# Patient Record
Sex: Male | Born: 1945 | Race: White | Hispanic: No | Marital: Married | Smoking: Never smoker
Health system: Southern US, Community
[De-identification: ages and names within clinical notes are randomized; demographics above are authoritative.]

## PROBLEM LIST (undated history)

## (undated) DIAGNOSIS — I219 Acute myocardial infarction, unspecified: Secondary | ICD-10-CM

## (undated) DIAGNOSIS — Z77098 Contact with and (suspected) exposure to other hazardous, chiefly nonmedicinal, chemicals: Secondary | ICD-10-CM

## (undated) DIAGNOSIS — K859 Acute pancreatitis without necrosis or infection, unspecified: Secondary | ICD-10-CM

## (undated) DIAGNOSIS — M1990 Unspecified osteoarthritis, unspecified site: Secondary | ICD-10-CM

## (undated) DIAGNOSIS — I10 Essential (primary) hypertension: Secondary | ICD-10-CM

## (undated) DIAGNOSIS — F419 Anxiety disorder, unspecified: Secondary | ICD-10-CM

## (undated) DIAGNOSIS — N200 Calculus of kidney: Secondary | ICD-10-CM

## (undated) DIAGNOSIS — I2510 Atherosclerotic heart disease of native coronary artery without angina pectoris: Secondary | ICD-10-CM

## (undated) DIAGNOSIS — G4730 Sleep apnea, unspecified: Secondary | ICD-10-CM

## (undated) DIAGNOSIS — E119 Type 2 diabetes mellitus without complications: Secondary | ICD-10-CM

## (undated) HISTORY — PX: CORONARY ARTERY BYPASS GRAFT: SHX141

## (undated) HISTORY — PX: TONSILLECTOMY: SUR1361

## (undated) HISTORY — PX: VASECTOMY: SHX75

## (undated) HISTORY — PX: CORONARY ANGIOPLASTY: SHX604

## (undated) HISTORY — PX: JOINT REPLACEMENT: SHX530

## (undated) HISTORY — PX: COLONOSCOPY: SHX174

## (undated) HISTORY — PX: EYE SURGERY: SHX253

## (undated) HISTORY — PX: CARDIAC CATHETERIZATION: SHX172

## (undated) HISTORY — PX: OTHER SURGICAL HISTORY: SHX169

---

## 2003-07-07 ENCOUNTER — Other Ambulatory Visit: Payer: Self-pay

## 2004-06-20 ENCOUNTER — Ambulatory Visit: Payer: Self-pay | Admitting: Internal Medicine

## 2006-12-05 ENCOUNTER — Other Ambulatory Visit: Payer: Self-pay

## 2006-12-05 ENCOUNTER — Emergency Department: Payer: Self-pay | Admitting: Emergency Medicine

## 2007-12-20 ENCOUNTER — Emergency Department: Payer: Self-pay | Admitting: Emergency Medicine

## 2009-11-03 ENCOUNTER — Emergency Department: Payer: Self-pay | Admitting: Emergency Medicine

## 2011-02-15 HISTORY — PX: REPLACEMENT TOTAL KNEE: SUR1224

## 2012-02-07 ENCOUNTER — Ambulatory Visit: Payer: Self-pay | Admitting: Specialist

## 2012-02-07 LAB — CBC
HCT: 41.1 % (ref 40.0–52.0)
HGB: 14.3 g/dL (ref 13.0–18.0)
MCH: 30.1 pg (ref 26.0–34.0)
MCHC: 34.8 g/dL (ref 32.0–36.0)
MCV: 86 fL (ref 80–100)
RDW: 13.8 % (ref 11.5–14.5)

## 2012-02-07 LAB — PROTIME-INR: INR: 1

## 2012-02-07 LAB — MRSA PCR SCREENING

## 2012-02-16 ENCOUNTER — Inpatient Hospital Stay: Payer: Self-pay | Admitting: Specialist

## 2012-02-16 LAB — CREATININE, SERUM
Creatinine: 1.17 mg/dL (ref 0.60–1.30)
EGFR (African American): >60

## 2012-02-17 LAB — BASIC METABOLIC PANEL
BUN: 19 mg/dL — ABNORMAL HIGH (ref 7–18)
Calcium, Total: 8.5 mg/dL (ref 8.5–10.1)
Chloride: 101 mmol/L (ref 98–107)
Creatinine: 1.09 mg/dL (ref 0.60–1.30)
Glucose: 298 mg/dL — ABNORMAL HIGH (ref 65–99)
Osmolality: 287 (ref 275–301)
Potassium: 3.7 mmol/L (ref 3.5–5.1)
Sodium: 137 mmol/L (ref 136–145)

## 2012-02-17 LAB — HEMATOCRIT: HCT: 35.8 % — ABNORMAL LOW (ref 40.0–52.0)

## 2012-02-18 LAB — CBC WITH DIFFERENTIAL/PLATELET
Basophil #: 0 10*3/uL (ref 0.0–0.1)
Basophil %: 0.6 %
Eosinophil #: 0.1 10*3/uL (ref 0.0–0.7)
Eosinophil %: 1.7 %
HCT: 33.8 % — ABNORMAL LOW (ref 40.0–52.0)
HGB: 11.4 g/dL — ABNORMAL LOW (ref 13.0–18.0)
Lymphocyte #: 1.1 10*3/uL (ref 1.0–3.6)
MCH: 29.1 pg (ref 26.0–34.0)
MCHC: 33.6 g/dL (ref 32.0–36.0)
MCV: 87 fL (ref 80–100)
Monocyte #: 0.5 x10 3/mm (ref 0.2–1.0)
Monocyte %: 7.1 %
Neutrophil %: 76.9 %
Platelet: 146 10*3/uL — ABNORMAL LOW (ref 150–440)
RBC: 3.9 10*6/uL — ABNORMAL LOW (ref 4.40–5.90)
RDW: 13.6 % (ref 11.5–14.5)

## 2012-05-16 ENCOUNTER — Ambulatory Visit: Payer: Self-pay | Admitting: Ophthalmology

## 2012-09-11 ENCOUNTER — Other Ambulatory Visit (HOSPITAL_COMMUNITY): Payer: Self-pay | Admitting: Orthopedic Surgery

## 2012-10-03 ENCOUNTER — Encounter (HOSPITAL_COMMUNITY)
Admission: RE | Admit: 2012-10-03 | Discharge: 2012-10-03 | Disposition: A | Discharge status: Home / Self Care | Payer: Medicare Other | Source: Ambulatory Visit | Attending: Orthopedic Surgery | Admitting: Orthopedic Surgery

## 2012-10-03 ENCOUNTER — Encounter (HOSPITAL_COMMUNITY): Payer: Self-pay

## 2012-10-03 DIAGNOSIS — Z01812 Encounter for preprocedural laboratory examination: Secondary | ICD-10-CM | POA: Insufficient documentation

## 2012-10-03 DIAGNOSIS — Z01818 Encounter for other preprocedural examination: Secondary | ICD-10-CM | POA: Insufficient documentation

## 2012-10-03 HISTORY — DX: Anxiety disorder, unspecified: F41.9

## 2012-10-03 HISTORY — DX: Essential (primary) hypertension: I10

## 2012-10-03 HISTORY — DX: Acute myocardial infarction, unspecified: I21.9

## 2012-10-03 HISTORY — DX: Type 2 diabetes mellitus without complications: E11.9

## 2012-10-03 HISTORY — DX: Unspecified osteoarthritis, unspecified site: M19.90

## 2012-10-03 LAB — COMPREHENSIVE METABOLIC PANEL
AST: 24 U/L (ref 0–37)
CO2: 26 mEq/L (ref 19–32)
Calcium: 10.1 mg/dL (ref 8.4–10.5)
Chloride: 104 mEq/L (ref 96–112)
Creatinine, Ser: 1 mg/dL (ref 0.50–1.35)
GFR calc Af Amer: 88 mL/min — ABNORMAL LOW (ref >90)
GFR calc non Af Amer: 76 mL/min — ABNORMAL LOW (ref >90)
Glucose, Bld: 79 mg/dL (ref 70–99)
Total Bilirubin: 0.3 mg/dL (ref 0.3–1.2)

## 2012-10-03 LAB — CBC
HCT: 40.7 % (ref 39.0–52.0)
Hemoglobin: 13.6 g/dL (ref 13.0–17.0)
MCH: 28.6 pg (ref 26.0–34.0)
MCV: 85.5 fL (ref 78.0–100.0)
Platelets: 182 10*3/uL (ref 150–400)
RBC: 4.76 MIL/uL (ref 4.22–5.81)
WBC: 6.1 10*3/uL (ref 4.0–10.5)

## 2012-10-03 LAB — PROTIME-INR: INR: 0.94 (ref 0.00–1.49)

## 2012-10-03 NOTE — Progress Notes (Signed)
Dr Adonis Brook called for ekg,stress.ov

## 2012-10-03 NOTE — Pre-Procedure Instructions (Signed)
Mark Bean  10/03/2012   Your procedure is scheduled on:  10/06/12  Report to [facility] at 1015 AM.  Call this number if you have problems the morning of surgery: [phone]   Remember:   Do not eat food or drink liquids after midnight.   Take these medicines the morning of surgery with A SIP OF WATER: none   Do not wear jewelry, make-up or nail polish.  Do not wear lotions, powders, or perfumes. You may wear deodorant.  Do not shave 48 hours prior to surgery. Men may shave face and neck.  Do not bring valuables to the hospital.  [facility] is not responsible                   for any belongings or valuables.  Contacts, dentures or bridgework may not be worn into surgery.  Leave suitcase in the car. After surgery it may be brought to your room.  For patients admitted to the hospital, checkout time is 11:00 AM the day of  discharge.   Patients discharged the day of surgery will not be allowed to drive  home.  Name and phone number of your driver: family  Special Instructions: Incentive Spirometry - Practice and bring it with you on the day of surgery.   Please read over the following fact sheets that you were given: Pain Booklet, Coughing and Deep Breathing and Surgical Site Infection Prevention

## 2012-10-10 ENCOUNTER — Other Ambulatory Visit (HOSPITAL_COMMUNITY): Payer: Self-pay | Admitting: Orthopedic Surgery

## 2012-10-10 MED ORDER — CEFAZOLIN SODIUM-DEXTROSE 2-3 GM-% IV SOLR
2.0000 g | INTRAVENOUS | Status: AC
  Administered 2012-10-11: 2 g via INTRAVENOUS
  Filled 2012-10-10: qty 50

## 2012-10-10 NOTE — Progress Notes (Signed)
Notified wife of time change to arrive at 06:30 verbalized understanding

## 2012-10-11 ENCOUNTER — Ambulatory Visit (HOSPITAL_COMMUNITY): Payer: Medicare Other | Admitting: Anesthesiology

## 2012-10-11 ENCOUNTER — Encounter (HOSPITAL_COMMUNITY): Payer: Self-pay | Admitting: *Deleted

## 2012-10-11 ENCOUNTER — Observation Stay (HOSPITAL_COMMUNITY)
Admission: RE | Admit: 2012-10-11 | Discharge: 2012-10-13 | Disposition: A | Discharge status: Home / Self Care | Payer: Medicare Other | Source: Ambulatory Visit | Attending: Orthopedic Surgery | Admitting: Orthopedic Surgery

## 2012-10-11 ENCOUNTER — Encounter (HOSPITAL_COMMUNITY): Payer: Self-pay | Admitting: Anesthesiology

## 2012-10-11 ENCOUNTER — Encounter (HOSPITAL_COMMUNITY)
Admission: RE | Disposition: A | Discharge status: Home / Self Care | Payer: Self-pay | Source: Ambulatory Visit | Attending: Orthopedic Surgery

## 2012-10-11 DIAGNOSIS — E11610 Type 2 diabetes mellitus with diabetic neuropathic arthropathy: Secondary | ICD-10-CM

## 2012-10-11 DIAGNOSIS — M869 Osteomyelitis, unspecified: Principal | ICD-10-CM | POA: Insufficient documentation

## 2012-10-11 DIAGNOSIS — A5211 Tabes dorsalis: Secondary | ICD-10-CM | POA: Insufficient documentation

## 2012-10-11 HISTORY — PX: AMPUTATION: SHX166

## 2012-10-11 HISTORY — PX: ANKLE FUSION: SHX881

## 2012-10-11 LAB — GLUCOSE, CAPILLARY
Glucose-Capillary: 190 mg/dL — ABNORMAL HIGH (ref 70–99)
Glucose-Capillary: 156 mg/dL — ABNORMAL HIGH (ref 70–99)
Glucose-Capillary: 141 mg/dL — ABNORMAL HIGH (ref 70–99)

## 2012-10-11 SURGERY — AMPUTATION, FOOT, RAY
Anesthesia: General | Site: Foot | Laterality: Right | Wound class: Clean

## 2012-10-11 MED ORDER — CEFAZOLIN SODIUM 1-5 GM-% IV SOLN
1.0000 g | Freq: Four times a day (QID) | INTRAVENOUS | Status: AC
  Administered 2012-10-11 – 2012-10-12 (×3): 1 g via INTRAVENOUS
  Filled 2012-10-11 (×3): qty 50

## 2012-10-11 MED ORDER — ASPIRIN 325 MG PO TABS: 325.0000 mg | ORAL_TABLET | Freq: Every day | ORAL | Status: DC

## 2012-10-11 MED ORDER — PROMETHAZINE HCL 25 MG/ML IJ SOLN: 6.2500 mg | INTRAMUSCULAR | Status: DC | PRN

## 2012-10-11 MED ORDER — GLYCOPYRROLATE 0.2 MG/ML IJ SOLN
INTRAMUSCULAR | Status: DC | PRN
  Administered 2012-10-11: 0.2 mg via INTRAVENOUS

## 2012-10-11 MED ORDER — CARVEDILOL 12.5 MG PO TABS
12.5000 mg | ORAL_TABLET | Freq: Two times a day (BID) | ORAL | Status: DC
  Administered 2012-10-11 – 2012-10-13 (×4): 12.5 mg via ORAL
  Filled 2012-10-11 (×6): qty 1

## 2012-10-11 MED ORDER — METOCLOPRAMIDE HCL 10 MG PO TABS: 5.0000 mg | ORAL_TABLET | Freq: Three times a day (TID) | ORAL | Status: DC | PRN

## 2012-10-11 MED ORDER — INSULIN GLARGINE 100 UNIT/ML ~~LOC~~ SOLN
60.0000 [IU] | Freq: Every day | SUBCUTANEOUS | Status: DC
  Filled 2012-10-11 (×3): qty 0.6

## 2012-10-11 MED ORDER — CITALOPRAM HYDROBROMIDE 20 MG PO TABS
20.0000 mg | ORAL_TABLET | Freq: Every day | ORAL | Status: DC
  Administered 2012-10-12 – 2012-10-13 (×2): 20 mg via ORAL
  Filled 2012-10-11 (×2): qty 1

## 2012-10-11 MED ORDER — OXYCODONE-ACETAMINOPHEN 5-325 MG PO TABS
1.0000 | ORAL_TABLET | ORAL | Status: DC | PRN
  Administered 2012-10-12 (×2): 2 via ORAL
  Administered 2012-10-12: 1 via ORAL
  Administered 2012-10-13 (×2): 2 via ORAL
  Filled 2012-10-11 (×3): qty 2
  Filled 2012-10-11: qty 1
  Filled 2012-10-11: qty 2

## 2012-10-11 MED ORDER — HYDROCODONE-ACETAMINOPHEN 5-325 MG PO TABS
1.0000 | ORAL_TABLET | ORAL | Status: DC | PRN
  Administered 2012-10-11 – 2012-10-12 (×5): 1 via ORAL
  Filled 2012-10-11 (×5): qty 1

## 2012-10-11 MED ORDER — FENTANYL CITRATE 0.05 MG/ML IJ SOLN: 25.0000 ug | INTRAMUSCULAR | Status: DC | PRN

## 2012-10-11 MED ORDER — SODIUM CHLORIDE 0.9 % IV SOLN
INTRAVENOUS | Status: DC
  Administered 2012-10-11: 14:00:00 via INTRAVENOUS

## 2012-10-11 MED ORDER — MEPERIDINE HCL 25 MG/ML IJ SOLN: 6.2500 mg | INTRAMUSCULAR | Status: DC | PRN

## 2012-10-11 MED ORDER — CHLORTHALIDONE 25 MG PO TABS
25.0000 mg | ORAL_TABLET | Freq: Every day | ORAL | Status: DC
  Administered 2012-10-11 – 2012-10-13 (×3): 25 mg via ORAL
  Filled 2012-10-11 (×3): qty 1

## 2012-10-11 MED ORDER — FENTANYL CITRATE 0.05 MG/ML IJ SOLN
INTRAMUSCULAR | Status: DC | PRN
  Administered 2012-10-11 (×2): 50 ug via INTRAVENOUS

## 2012-10-11 MED ORDER — LISINOPRIL 40 MG PO TABS
40.0000 mg | ORAL_TABLET | Freq: Every day | ORAL | Status: DC
  Administered 2012-10-11 – 2012-10-13 (×3): 40 mg via ORAL
  Filled 2012-10-11 (×3): qty 1

## 2012-10-11 MED ORDER — INSULIN ASPART 100 UNIT/ML ~~LOC~~ SOLN
0.0000 [IU] | Freq: Three times a day (TID) | SUBCUTANEOUS | Status: DC
  Administered 2012-10-12: 2 [IU] via SUBCUTANEOUS
  Administered 2012-10-12: 3 [IU] via SUBCUTANEOUS
  Administered 2012-10-12: 5 [IU] via SUBCUTANEOUS
  Administered 2012-10-13 (×2): 3 [IU] via SUBCUTANEOUS

## 2012-10-11 MED ORDER — METOCLOPRAMIDE HCL 5 MG/ML IJ SOLN: 5.0000 mg | Freq: Three times a day (TID) | INTRAMUSCULAR | Status: DC | PRN

## 2012-10-11 MED ORDER — ASPIRIN EC 325 MG PO TBEC
325.0000 mg | DELAYED_RELEASE_TABLET | Freq: Every day | ORAL | Status: DC
  Administered 2012-10-11 – 2012-10-13 (×3): 325 mg via ORAL
  Filled 2012-10-11 (×3): qty 1

## 2012-10-11 MED ORDER — ONDANSETRON HCL 4 MG/2ML IJ SOLN
INTRAMUSCULAR | Status: DC | PRN
  Administered 2012-10-11: 4 mg via INTRAVENOUS

## 2012-10-11 MED ORDER — 0.9 % SODIUM CHLORIDE (POUR BTL) OPTIME
TOPICAL | Status: DC | PRN
  Administered 2012-10-11: 1000 mL

## 2012-10-11 MED ORDER — PROPOFOL 10 MG/ML IV BOLUS
INTRAVENOUS | Status: DC | PRN
  Administered 2012-10-11: 200 mg via INTRAVENOUS

## 2012-10-11 MED ORDER — ONDANSETRON HCL 4 MG PO TABS: 4.0000 mg | ORAL_TABLET | Freq: Four times a day (QID) | ORAL | Status: DC | PRN

## 2012-10-11 MED ORDER — LIDOCAINE HCL (CARDIAC) 20 MG/ML IV SOLN
INTRAVENOUS | Status: DC | PRN
  Administered 2012-10-11: 40 mg via INTRAVENOUS

## 2012-10-11 MED ORDER — SIMVASTATIN 20 MG PO TABS
20.0000 mg | ORAL_TABLET | Freq: Every day | ORAL | Status: DC
  Administered 2012-10-11 – 2012-10-12 (×2): 20 mg via ORAL
  Filled 2012-10-11 (×3): qty 1

## 2012-10-11 MED ORDER — ONDANSETRON HCL 4 MG/2ML IJ SOLN: 4.0000 mg | Freq: Four times a day (QID) | INTRAMUSCULAR | Status: DC | PRN

## 2012-10-11 MED ORDER — HYDROMORPHONE HCL PF 1 MG/ML IJ SOLN
0.5000 mg | INTRAMUSCULAR | Status: DC | PRN
  Administered 2012-10-11 – 2012-10-12 (×2): 1 mg via INTRAVENOUS
  Filled 2012-10-11 (×2): qty 1

## 2012-10-11 MED ORDER — LACTATED RINGERS IV SOLN
INTRAVENOUS | Status: DC | PRN
  Administered 2012-10-11 (×2): via INTRAVENOUS

## 2012-10-11 MED ORDER — METFORMIN HCL 500 MG PO TABS
1000.0000 mg | ORAL_TABLET | Freq: Two times a day (BID) | ORAL | Status: DC
  Administered 2012-10-11 – 2012-10-13 (×4): 1000 mg via ORAL
  Filled 2012-10-11 (×6): qty 2

## 2012-10-11 MED ORDER — INSULIN ASPART 100 UNIT/ML ~~LOC~~ SOLN
4.0000 [IU] | Freq: Three times a day (TID) | SUBCUTANEOUS | Status: DC
  Administered 2012-10-11 – 2012-10-13 (×6): 4 [IU] via SUBCUTANEOUS

## 2012-10-11 MED ORDER — MIDAZOLAM HCL 2 MG/2ML IJ SOLN: 0.5000 mg | Freq: Once | INTRAMUSCULAR | Status: DC | PRN

## 2012-10-11 MED ORDER — PHENYLEPHRINE HCL 10 MG/ML IJ SOLN
INTRAMUSCULAR | Status: DC | PRN
  Administered 2012-10-11: 120 ug via INTRAVENOUS
  Administered 2012-10-11 (×2): 80 ug via INTRAVENOUS
  Administered 2012-10-11: 120 ug via INTRAVENOUS
  Administered 2012-10-11: 80 ug via INTRAVENOUS

## 2012-10-11 SURGICAL SUPPLY — 52 items
BANDAGE ESMARK 6X9 LF (GAUZE/BANDAGES/DRESSINGS) IMPLANT
BANDAGE GAUZE ELAST BULKY 4 IN (GAUZE/BANDAGES/DRESSINGS) ×4 IMPLANT
BIT DRILL 5 ACE CANN QC (BIT) ×1 IMPLANT
BLADE SAW SGTL 83.5X18.5 (BLADE) ×2 IMPLANT
BLADE SAW SGTL MED 73X18.5 STR (BLADE) ×1 IMPLANT
BLADE SURG 10 STRL SS (BLADE) IMPLANT
BNDG CMPR 9X6 STRL LF SNTH (GAUZE/BANDAGES/DRESSINGS) ×1
BNDG COHESIVE 4X5 TAN STRL (GAUZE/BANDAGES/DRESSINGS) ×4 IMPLANT
BNDG COHESIVE 6X5 TAN STRL LF (GAUZE/BANDAGES/DRESSINGS) ×2 IMPLANT
BNDG ESMARK 6X9 LF (GAUZE/BANDAGES/DRESSINGS) ×2
CLOTH BEACON ORANGE TIMEOUT ST (SAFETY) ×2 IMPLANT
COTTON STERILE ROLL (GAUZE/BANDAGES/DRESSINGS) ×2 IMPLANT
COVER MAYO STAND STRL (DRAPES) IMPLANT
COVER SURGICAL LIGHT HANDLE (MISCELLANEOUS) ×2 IMPLANT
CUFF TOURNIQUET SINGLE 34IN LL (TOURNIQUET CUFF) IMPLANT
CUFF TOURNIQUET SINGLE 44IN (TOURNIQUET CUFF) IMPLANT
DRAPE INCISE IOBAN 66X45 STRL (DRAPES) ×2 IMPLANT
DRAPE OEC MINIVIEW 54X84 (DRAPES) ×2 IMPLANT
DRAPE PROXIMA HALF (DRAPES) ×2 IMPLANT
DRAPE U-SHAPE 47X51 STRL (DRAPES) ×3 IMPLANT
DRSG ADAPTIC 3X8 NADH LF (GAUZE/BANDAGES/DRESSINGS) ×2 IMPLANT
DURAPREP 26ML APPLICATOR (WOUND CARE) ×2 IMPLANT
ELECT REM PT RETURN 9FT ADLT (ELECTROSURGICAL) ×2
ELECTRODE REM PT RTRN 9FT ADLT (ELECTROSURGICAL) ×1 IMPLANT
GLOVE BIOGEL PI IND STRL 9 (GLOVE) ×1 IMPLANT
GLOVE BIOGEL PI INDICATOR 9 (GLOVE) ×1
GLOVE SURG ORTHO 9.0 STRL STRW (GLOVE) ×2 IMPLANT
GOWN PREVENTION PLUS XLARGE (GOWN DISPOSABLE) ×2 IMPLANT
GOWN SRG XL XLNG 56XLVL 4 (GOWN DISPOSABLE) ×2 IMPLANT
GOWN STRL NON-REIN XL XLG LVL4 (GOWN DISPOSABLE) ×4
KIT BASIN OR (CUSTOM PROCEDURE TRAY) ×2 IMPLANT
KIT ROOM TURNOVER OR (KITS) ×2 IMPLANT
MANIFOLD NEPTUNE II (INSTRUMENTS) ×2 IMPLANT
NS IRRIG 1000ML POUR BTL (IV SOLUTION) ×2 IMPLANT
PACK ORTHO EXTREMITY (CUSTOM PROCEDURE TRAY) ×2 IMPLANT
PAD ARMBOARD 7.5X6 YLW CONV (MISCELLANEOUS) ×4 IMPLANT
PAD CAST 4YDX4 CTTN HI CHSV (CAST SUPPLIES) ×1 IMPLANT
PADDING CAST COTTON 4X4 STRL (CAST SUPPLIES) ×2
PIN THREADED GUIDE ACE (PIN) ×2 IMPLANT
SCREW LAG CAN 8.0 24 THR 120 (Screw) ×1 IMPLANT
SPONGE GAUZE 4X4 12PLY (GAUZE/BANDAGES/DRESSINGS) ×2 IMPLANT
SPONGE LAP 18X18 X RAY DECT (DISPOSABLE) ×3 IMPLANT
STAPLER VISISTAT 35W (STAPLE) ×2 IMPLANT
STOCKINETTE IMPERVIOUS LG (DRAPES) IMPLANT
SUCTION FRAZIER TIP 10 FR DISP (SUCTIONS) ×2 IMPLANT
SUT ETHILON 2 0 PSLX (SUTURE) ×6 IMPLANT
SUT VIC AB 2-0 CTB1 (SUTURE) ×3 IMPLANT
TOWEL OR 17X24 6PK STRL BLUE (TOWEL DISPOSABLE) ×2 IMPLANT
TOWEL OR 17X26 10 PK STRL BLUE (TOWEL DISPOSABLE) ×2 IMPLANT
TUBE CONNECTING 12X1/4 (SUCTIONS) ×2 IMPLANT
UNDERPAD 30X30 INCONTINENT (UNDERPADS AND DIAPERS) ×2 IMPLANT
WATER STERILE IRR 1000ML POUR (IV SOLUTION) ×2 IMPLANT

## 2012-10-11 NOTE — Transfer of Care (Signed)
Immediate Anesthesia Transfer of Care Note  Patient: Mark Bean  Procedure(s) Performed: Procedure(s): 2nd ray amputation, medial column fusion and ostectomy right foot (Right) ARTHRODESIS ANKLE (Right)  Patient Location: PACU  Anesthesia Type:General  Level of Consciousness: patient cooperative and responds to stimulation  Airway & Oxygen Therapy: Patient Spontanous Breathing and Patient connected to nasal cannula oxygen  Post-op Assessment: Report given to PACU RN and Post -op Vital signs reviewed and stable  Post vital signs: Reviewed and stable  Complications: No apparent anesthesia complications

## 2012-10-11 NOTE — Progress Notes (Signed)
At 1155, patient arrived from PACU. Dr. Lajoyce Corners shortly thereafter to discuss follow-up with patient and family. Small amount of bleeding through surgical dressing on right heel. Reinforced with an ABD on a pad, elevated extremity. Will monitor. VSS. Patient on oxygen 2 L. 1 Vicodin given for 5/10 pain in right foot with good relief. Will monitor mobility and comfort. Sherlyn Lees, RN

## 2012-10-11 NOTE — Progress Notes (Signed)
Orthopedic Tech Progress Note Patient Details:  Mark Bean 05-07-1945 [id number]  Ortho Devices Type of Ortho Device: Postop shoe/boot Ortho Device/Splint Interventions: Application   Cammer, Mickie Bail 10/11/2012, 1:40 PM

## 2012-10-11 NOTE — Progress Notes (Signed)
At 1900, patient standing up to void 800 cc. Dressing reinforced on foot with increased bleeding. Sherlyn Lees, RN

## 2012-10-11 NOTE — H&P (Signed)
Mark Bean is an 67 y.o. male.   Chief Complaint: Osteomyelitis right foot second toe with Charcot collapse of the medial column HPI: Patient is a 67 year old gentleman with diabetic insensate neuropathy. 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  Past Medical History  Diagnosis Date  . Myocardial infarction   . Diabetes mellitus without complication   . Arthritis   . Anxiety   . Hypertension     dr Shonna Chock    [id number]    Past Surgical History  Procedure Laterality Date  . Cardiac catheterization      2000  . Joint replacement    . Coronary artery bypass graft      2000   1 stent  . Eye surgery      No family history on file. Social History:  reports that he has never smoked. He does not have any smokeless tobacco history on file. He reports that  drinks alcohol. He reports that he does not use illicit drugs.  Allergies: No Known Allergies  Medications Prior to Admission  Medication Sig Dispense Refill  . aspirin 325 MG tablet Take 325 mg by mouth at bedtime.      . carvedilol (COREG) 12.5 MG tablet Take 12.5 mg by mouth 2 (two) times daily with a meal.      . chlorthalidone (HYGROTON) 25 MG tablet Take 25 mg by mouth daily.      . ciprofloxacin (CIPRO) 500 MG tablet Take 500 mg by mouth 2 (two) times daily.      . citalopram (CELEXA) 20 MG tablet Take 20 mg by mouth daily.      [name] doxycycline (VIBRA-TABS) 100 MG tablet Take 100 mg by mouth 2 (two) times daily.      . insulin aspart (NOVOLOG) 100 UNIT/ML injection Inject 30 Units into the skin 3 (three) times daily with meals.      . insulin glargine (LANTUS) 100 UNIT/ML injection Inject 60 Units into the skin at bedtime.      [name] lisinopril (PRINIVIL,ZESTRIL) 40 MG tablet Take 40 mg by mouth daily.      . metFORMIN (GLUCOPHAGE) 1000 MG tablet Take 1,000 mg by mouth 2 (two) times daily with a meal.      . Omega-3 Fatty Acids (FISH  OIL TRIPLE STRENGTH) 1400 MG CAPS Take 1,400 mg by mouth daily.      . simvastatin (ZOCOR) 40 MG tablet Take 20 mg by mouth at bedtime.      . Vitamin D, Ergocalciferol, (DRISDOL) 50000 UNITS CAPS capsule Take 50,000 Units by mouth every 7 (seven) days. Takes on Tuesdays.        No results found for this or any previous visit (from the past 48 hour(s)). No results found.  Review of Systems  All other systems reviewed and are negative.    There were no vitals taken for this visit. Physical Exam  On examination patient has palpable pulses. There is ulceration osteomyelitis of the second toe. There is a rocker-bottom deformity through the medial column. 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  Plan: We'll plan for second ray amputation of the right second toe. Will plan for correction of the rocker-bottom deformity with internal fixation of the medial column right foot. Risks and benefits were discussed including persistent infection nonhealing of the wound need for additional surgery.  Patient states he understands and wished to proceed at this time.  DUDA,MARCUS V 10/11/2012, 6:14 AM

## 2012-10-11 NOTE — Anesthesia Postprocedure Evaluation (Signed)
Anesthesia Post Note  Patient: Mark Bean  Procedure(s) Performed: Procedure(s) (LRB): 2nd ray amputation, medial column fusion and ostectomy right foot (Right) ARTHRODESIS ANKLE (Right)  Anesthesia type: GA  Patient location: PACU  Post pain: Pain level controlled  Post assessment: Post-op Vital signs reviewed  Last Vitals:  Filed Vitals:   10/11/12 1139  BP:   Pulse:   Temp: 36.5 C  Resp:     Post vital signs: Reviewed  Level of consciousness: sedated  Complications: No apparent anesthesia complications

## 2012-10-11 NOTE — Anesthesia Preprocedure Evaluation (Signed)
Anesthesia Evaluation  Patient identified by MRN, date of birth, ID band Patient awake    Reviewed: Allergy & Precautions, H&P , Patient's Chart, lab work & pertinent test results, reviewed documented beta blocker date and time   History of Anesthesia Complications Negative for: history of anesthetic complications  Airway Mallampati: III TM Distance: >3 FB Neck ROM: full    Dental no notable dental hx.    Pulmonary neg pulmonary ROS,  breath sounds clear to auscultation  Pulmonary exam normal       Cardiovascular Exercise Tolerance: Good hypertension, + Past MI negative cardio ROS  Rhythm:regular Rate:Normal     Neuro/Psych PSYCHIATRIC DISORDERS Anxiety negative neurological ROS  negative psych ROS   GI/Hepatic negative GI ROS, Neg liver ROS,   Endo/Other  negative endocrine ROSdiabetesMorbid obesity  Renal/GU negative Renal ROS     Musculoskeletal   Abdominal   Peds  Hematology negative hematology ROS (+)   Anesthesia Other Findings   Reproductive/Obstetrics negative OB ROS                           Anesthesia Physical Anesthesia Plan  ASA: III  Anesthesia Plan: General LMA   Post-op Pain Management:    Induction:   Airway Management Planned:   Additional Equipment:   Intra-op Plan:   Post-operative Plan:   Informed Consent: I have reviewed the patients History and Physical, chart, labs and discussed the procedure including the risks, benefits and alternatives for the proposed anesthesia with the patient or authorized representative who has indicated his/her understanding and acceptance.   Dental Advisory Given  Plan Discussed with: CRNA, Surgeon and Anesthesiologist  Anesthesia Plan Comments:         Anesthesia Quick Evaluation

## 2012-10-11 NOTE — Op Note (Signed)
OPERATIVE REPORT  DATE OF SURGERY: 10/11/2012  PATIENT:  Mark Bean,  67 y.o. male  PRE-OPERATIVE DIAGNOSIS:  Charcot Collapse and osteomyelitis 2nd toe right foot  POST-OPERATIVE DIAGNOSIS:  charcot collapse and osteomyelitits 2nd toe right foot  PROCEDURE:  Procedure(s): 2nd ray amputation, medial column fusion and ostectomy right foot ARTHRODESIS ANKLE  SURGEON:  Surgeon(s): [name], MD  ANESTHESIA:   general  EBL:  min ML  SPECIMEN:  No Specimen  TOURNIQUET:  * No tourniquets in log *  PROCEDURE DETAILS: Patient is a 67 year old gentleman with diabetic insensate neuropathy. He has osteomyelitis of the second toe right foot as well as Charcot collapse. Patient is failed conservative treatment and presents at this time for amputation of the second toe as well as medial column realignment with intramedullary fusion of the medial column. Risks and benefits were discussed including infection nonhealing of the wounds nonhealing of the bone need for additional surgery. Patient states he understands and wishes to proceed at this time. Description of procedure patient was brought to the operating room and underwent a general anesthetic. After adequate levels and anesthesia were obtained patient's right lower extremity was prepped using DuraPrep and draped into a sterile field. Attention was first focused on the second toe. Patient's toe was amputated through the MTP joint. Wound was irrigated hemostasis was obtained there is no deep abscess no signs of any infection at the surgical site. The incision was closed using 2-0 nylon. Attention was then focused on the medial column. Patient had a Charcot rocker-bottom deformity. Patient underwent a medial incision the base of the first metatarsal medial cuneiform joint was debrided with a saw and the medial cuneiform navicular joint was also debrided. The medial column was realigned an intramedullary guidewire was placed from the metatarsal  head up into the talus. C-arm fluoroscopy verified reduction. This was drilled and then stabilized with a 120 mm x 8 mm cannulated screw. The wound was irrigated with normal saline C-arm fluoroscopy verified reduction. The incision was closed using 2-0 nylon. The wound was covered with Adaptic orthopedic sponges AB dressing Kerlix and Coban. Patient was extubated taken to the PACU in stable condition.  PLAN OF CARE: Admit to inpatient   PATIENT DISPOSITION:  PACU - hemodynamically stable.   [name], MD 10/11/2012 9:55 AM

## 2012-10-11 NOTE — Anesthesia Procedure Notes (Signed)
Procedure Name: LMA Insertion Date/Time: 10/11/2012 8:56 AM Performed by: Arlice Colt B Pre-anesthesia Checklist: Patient identified, Emergency Drugs available, Suction available, Patient being monitored and Timeout performed Patient Re-evaluated:Patient Re-evaluated prior to inductionOxygen Delivery Method: Circle system utilized Preoxygenation: Pre-oxygenation with 100% oxygen Intubation Type: IV induction LMA: LMA with gastric port inserted LMA Size: 5.0 Number of attempts: 1 Placement Confirmation: positive ETCO2 and breath sounds checked- equal and bilateral Tube secured with: Tape Dental Injury: Teeth and Oropharynx as per pre-operative assessment

## 2012-10-12 ENCOUNTER — Encounter (HOSPITAL_COMMUNITY)
Admission: RE | Disposition: A | Discharge status: Home / Self Care | Payer: Self-pay | Source: Ambulatory Visit | Attending: Orthopedic Surgery

## 2012-10-12 LAB — GLUCOSE, CAPILLARY
Glucose-Capillary: 194 mg/dL — ABNORMAL HIGH (ref 70–99)
Glucose-Capillary: 217 mg/dL — ABNORMAL HIGH (ref 70–99)
Glucose-Capillary: 143 mg/dL — ABNORMAL HIGH (ref 70–99)
Glucose-Capillary: 131 mg/dL — ABNORMAL HIGH (ref 70–99)

## 2012-10-12 SURGERY — ECHOCARDIOGRAM, TRANSESOPHAGEAL
Anesthesia: Moderate Sedation

## 2012-10-12 MED ORDER — HYDROCODONE-ACETAMINOPHEN 5-325 MG PO TABS: 1.0000 | ORAL_TABLET | Freq: Four times a day (QID) | ORAL | Status: DC | PRN

## 2012-10-12 MED ORDER — OXYCODONE-ACETAMINOPHEN 5-325 MG PO TABS: 1.0000 | ORAL_TABLET | ORAL | Status: DC | PRN

## 2012-10-12 NOTE — Evaluation (Signed)
Physical Therapy Evaluation Patient Details Name: Mark Bean MRN: [id number] DOB: [birth year] Today's Date: 10/12/2012 Time: 0940-1001 PT Time Calculation (min): 21 min  PT Assessment / Plan / Recommendation History of Present Illness     Clinical Impression  Pt is a 67 y.o. Male who is s/p 2nd ray amputation, medial column fusion and ostectomy Rt foot, arthrodeses of Rt ankle surgery resulting in functional limitations due to the deficits listed below (see PT Problem List).  Patient will benefit from skilled PT to increase their independence and safety with mobility to allow discharge to the venue listed below. Pt requires 2+(A) to perform sit <> stand <> SPT. Pt is very anxious and is a fall risk. Pt and significant other very concerned due to lack of mobility and agree with ST SNF prior to returning home to increase independence and decr caregiver burden. Will cont to follow acutely.      PT Assessment  Patient needs continued PT services    Follow Up Recommendations  SNF;Supervision/Assistance - 24 hour    Does the patient have the potential to tolerate intense rehabilitation      Barriers to Discharge Decreased caregiver support;Inaccessible home environment pt significant other is unable to physically (A) and pt has 3 steps without railing to enter house     Equipment Recommendations  Other (comment) (TBD at SNF)    Recommendations for Other Services OT consult   Frequency Min 5X/week    Precautions / Restrictions Precautions Precautions: Fall Restrictions Weight Bearing Restrictions: Yes RLE Weight Bearing: Non weight bearing   Pertinent Vitals/Pain "no pain. Just sleepy" pt premedicated       Mobility  Bed Mobility Bed Mobility: Supine to Sit;Sitting - Scoot to Edge of Bed Supine to Sit: With rails;HOB elevated;5: Supervision Sitting - Scoot to Edge of Bed: 5: Supervision;With rail Details for Bed Mobility Assistance: no physical (A) needed; pt relied heavily  on hand rails and HOB elevated to advance Rt LE to/off EOB; supervision for safety and cues to maintain NWB status  Transfers Transfers: Sit to Stand;Stand to Sit;Stand Pivot Transfers Sit to Stand: 1: +2 Total assist;From elevated surface;With upper extremity assist Sit to Stand: Patient Percentage: 30% Stand to Sit: 1: +2 Total assist;With upper extremity assist;With armrests;To chair/3-in-1 Stand to Sit: Patient Percentage: 30% Stand Pivot Transfers: 1: +2 Total assist;With armrests Stand Pivot Transfers: Patient Percentage: 30% Details for Transfer Assistance: pt with incr difficulty with transfers and becomes anxious; requires 2+ (A) to complete transfers and max cues for sequencing and to maintain NWB status on Rt LE; pt requires incr time to complete task and facilitation to shift and perform pivotal steps to chair  (max cues for hand placement and sequencing ) Ambulation/Gait Ambulation/Gait Assistance: Not tested (comment) Stairs: No Wheelchair Mobility Wheelchair Mobility: No         PT Diagnosis: Difficulty walking;Acute pain  PT Problem List: Decreased strength;Decreased range of motion;Decreased activity tolerance;Decreased balance;Decreased mobility;Decreased knowledge of use of DME;Decreased safety awareness;Decreased knowledge of precautions;Obesity;Pain PT Treatment Interventions: DME instruction;Stair training;Gait training;Functional mobility training;Therapeutic activities;Therapeutic exercise;Balance training;Neuromuscular re-education;Patient/family education;Wheelchair mobility training     PT Goals(Current goals can be found in the care plan section) Acute Rehab PT Goals Patient Stated Goal: to go to rehab PT Goal Formulation: With patient Time For Goal Achievement: 10/19/12 Potential to Achieve Goals: Good  Visit Information  Last PT Received On: 10/12/12       Prior Functioning  Home Living Family/patient expects to be discharged to::  Private  residence Living Arrangements: Spouse/significant other Available Help at Discharge: Family;Available 24 hours/day Type of Home: House Home Access: Stairs to enter [employer] of Steps: 3 Entrance Stairs-Rails: Right Home Layout: One level Home Equipment: Walker - 2 wheels;Cane - single point Additional Comments: pt has walk in shower; standard toilet seat height  Prior Function Level of Independence: Independent Communication Communication: No difficulties Dominant Hand: Left    Cognition  Cognition Arousal/Alertness: Lethargic;Suspect due to medications Behavior During Therapy: Anxious Overall Cognitive Status: Within Functional Limits for tasks assessed    Extremity/Trunk Assessment Lower Extremity Assessment Lower Extremity Assessment: RLE deficits/detail RLE Deficits / Details: ankle limited; knee WFL  RLE: Unable to fully assess due to pain;Unable to fully assess due to immobilization RLE Sensation: history of peripheral neuropathy Cervical / Trunk Assessment Cervical / Trunk Assessment: Kyphotic   Balance Balance Balance Assessed: Yes Static Sitting Balance Static Sitting - Balance Support: Bilateral upper extremity supported;Feet supported (Lt LE supported) Static Sitting - Level of Assistance: 5: Stand by assistance Static Sitting - Comment/# of Minutes: pt tolerated sitting EOB ~67min; c/o dizziniess; subsided with deep breathing  Static Standing Balance Static Standing - Balance Support: Bilateral upper extremity supported;During functional activity Static Standing - Level of Assistance: 1: +2 Total assist Static Standing - Comment/# of Minutes: pt requires 2+ to maintain balance while maintaining NWB status on Rt LE   End of Session PT - End of Session Equipment Utilized During Treatment: Gait belt Activity Tolerance: Patient limited by fatigue;Patient limited by lethargy Patient left: in chair;with call bell/phone within reach;with family/visitor  present  GP Functional Assessment Tool Used: clinical judgement  Functional Limitation: Mobility: Walking and moving around Mobility: Walking and Moving Around Current Status (A5409): At least 60 percent but less than 80 percent impaired, limited or restricted Mobility: Walking and Moving Around Goal Status [phone]): At least 1 percent but less than 20 percent impaired, limited or restricted   Donell Sievert, [place] [phone] 10/12/2012, 10:33 AM

## 2012-10-12 NOTE — Discharge Summary (Signed)
Physician Discharge Summary  Patient ID: Mark Bean MRN: [id number] DOB/AGE: 1945/06/26 67 y.o.  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 Diabetic insensate neuropathy.  Discharge Diagnoses: Same Active Problems:   * No active hospital problems. *   Discharged Condition: stable  Hospital Course: Patient's hospital course was essentially unremarkable. He underwent reconstruction of the medial column for Charcot collapse. He also underwent amputation of the second toe. Postoperatively patient progressed well and was discharged to home in stable condition.  Consults: None  Significant Diagnostic Studies: labs: Routine labs  Treatments: surgery: See operative note  Discharge Exam: Blood pressure 131/62, pulse 83, temperature 99.6 F (37.6 C), temperature source Oral, resp. rate 16, SpO2 91.00%. Incision/Wound: dressing clean and dry  Disposition: Final discharge disposition not confirmed  Discharge Orders   Future Orders Complete By Expires   Call MD / Call 911  As directed    Comments:     If you experience chest pain or shortness of breath, CALL 911 and be transported to the hospital emergency room.  If you develope a fever above 101 F, pus (white drainage) or increased drainage or redness at the wound, or calf pain, call your surgeon's office.   Constipation Prevention  As directed    Comments:     Drink plenty of fluids.  Prune juice may be helpful.  You may use a stool softener, such as Colace (over the counter) 100 mg twice a day.  Use MiraLax (over the counter) for constipation as needed.   Diet - low sodium heart healthy  As directed    Increase activity slowly as tolerated  As directed    Non weight bearing  As directed        Medication List         aspirin 325 MG tablet  Take 325 mg by mouth at bedtime.     carvedilol 12.5 MG  tablet  Commonly known as:  COREG  Take 12.5 mg by mouth 2 (two) times daily with a meal.     chlorthalidone 25 MG tablet  Commonly known as:  HYGROTON  Take 25 mg by mouth daily.     ciprofloxacin 500 MG tablet  Commonly known as:  CIPRO  Take 500 mg by mouth 2 (two) times daily.     citalopram 20 MG tablet  Commonly known as:  CELEXA  Take 20 mg by mouth daily.     doxycycline 100 MG tablet  Commonly known as:  VIBRA-TABS  Take 100 mg by mouth 2 (two) times daily.     FISH OIL TRIPLE STRENGTH 1400 MG Caps  Take 1,400 mg by mouth daily.     HYDROcodone-acetaminophen 5-325 MG per tablet  Commonly known as:  NORCO  Take 1 tablet by mouth every 6 (six) hours as needed for pain.     insulin aspart 100 UNIT/ML injection  Commonly known as:  novoLOG  Inject 30 Units into the skin 3 (three) times daily with meals.     insulin glargine 100 UNIT/ML injection  Commonly known as:  LANTUS  Inject 60 Units into the skin at bedtime.     lisinopril 40 MG tablet  Commonly known as:  PRINIVIL,ZESTRIL  Take 40 mg by mouth daily.     metFORMIN 1000 MG tablet  Commonly known as:  GLUCOPHAGE  Take 1,000 mg by mouth 2 (two) times daily with a meal.  oxyCODONE-acetaminophen 5-325 MG per tablet  Commonly known as:  ROXICET  Take 1 tablet by mouth every 4 (four) hours as needed for pain.     simvastatin 40 MG tablet  Commonly known as:  ZOCOR  Take 20 mg by mouth at bedtime.     Vitamin D (Ergocalciferol) 50000 UNITS Caps capsule  Commonly known as:  DRISDOL  Take 50,000 Units by mouth every 7 (seven) days. Takes on Tuesdays.           Follow-up Information   Follow up with Copelan Maultsby V, MD In 1 week.   Specialty:  Orthopedic Surgery   Contact information:   [address] [place] [address] [phone]       Signed: [name] 10/12/2012, 6:33 AM

## 2012-10-12 NOTE — Progress Notes (Signed)
Patient having new onset severe pain and daughter concerned about taking him home in this condition. Dr Lajoyce Corners notified and agreeable to letting patient stay through the day and will plan for discharge tomorrow.

## 2012-10-12 NOTE — Progress Notes (Signed)
This was a routine visit with patient. Provided words of encouragement, empathic listen and ministry of presence . Patient was glad for visit and conversation.  Will follow as needed.

## 2012-10-12 NOTE — Progress Notes (Signed)
UR COMPLETED  

## 2012-10-12 NOTE — Progress Notes (Signed)
Clinical Social Work Department  BRIEF PSYCHOSOCIAL ASSESSMENT  Patient:Mark Bean Account Number: [id number]  Admit date: 10/11/12 Clinical Social Worker Sabino Niemann, MSW Date/Time: 10/12/2012 11:00 AM Referred by: Physician Date Referred:  Referred for   SNF Placement   Other Referral:  Interview type: Patient  Other interview type: PSYCHOSOCIAL DATA  Living Status: with wife Admitted from facility:  Level of care:  Primary support name: Suski,LORA LEA  Primary support relationship to patient: Spouse Degree of support available:  Strong and vested  CURRENT CONCERNS  Current Concerns   Post-Acute Placement   Other Concerns:  SOCIAL WORK ASSESSMENT / PLAN  CSW met with pt re: PT recommendation for SNF.   Pt lives with his spouse  CSW explained placement process and answered questions.   Pt reports [facility] as his preference    CSW completed FL2 and initiated SNF search.     Assessment/plan status: Information/Referral to [facility]  Other assessment/ plan:  Information/referral to community resources:  SNF     PATIENT'S/FAMILY'S RESPONSE TO PLAN OF CARE:  Pt  reports he is agreeable to ST SNF in order to increase strength and independence with mobility prior to returning home  Pt verbalized understanding of placement process and appreciation for CSW assist.   Sabino Niemann, MSW [phone]

## 2012-10-13 ENCOUNTER — Encounter: Payer: Self-pay | Admitting: Internal Medicine

## 2012-10-13 LAB — GLUCOSE, CAPILLARY: Glucose-Capillary: 192 mg/dL — ABNORMAL HIGH (ref 70–99)

## 2012-10-13 NOTE — Progress Notes (Signed)
Physical Therapy Treatment Patient Details Name: Mark Bean MRN: [id number] DOB: 07-15-1945 Today's Date: 10/13/2012 Time: 1030-1055 PT Time Calculation (min): 25 min  PT Assessment / Plan / Recommendation  History of Present Illness     PT Comments   Pt progressing with therapy. Was able to increase amb distance with min (A).  Is impulsive with transfers and requires (A) to maintain NWB status while maintaining balance. Will benefit from ST SNF prior to returning home to incr independence and decr caregiver burden.   Follow Up Recommendations  SNF;Supervision/Assistance - 24 hour     Does the patient have the potential to tolerate intense rehabilitation     Barriers to Discharge        Equipment Recommendations   (TBD)    Recommendations for Other Services    Frequency Min 5X/week   Progress towards PT Goals Progress towards PT goals: Progressing toward goals  Plan Current plan remains appropriate    Precautions / Restrictions Precautions Precautions: Fall Restrictions Weight Bearing Restrictions: Yes RLE Weight Bearing: Non weight bearing   Pertinent Vitals/Pain 5/10 patient repositioned for comfort    Mobility  Bed Mobility Bed Mobility: Supine to Sit;Sitting - Scoot to Edge of Bed Supine to Sit: 5: Supervision;HOB elevated;With rails Sitting - Scoot to Edge of Bed: 5: Supervision;With rail Details for Bed Mobility Assistance: no physical (A) needed; cues to maintain NWB through Rt LE and cues for hand placement; requires HOB elevated and handrails  Transfers Transfers: Sit to Stand;Stand to Sit;Stand Pivot Transfers Sit to Stand: 3: Mod assist;From bed;From chair/3-in-1;With armrests;With upper extremity assist Stand to Sit: 3: Mod assist;To chair/3-in-1;With armrests;To elevated surface;With upper extremity assist Details for Transfer Assistance: pt with difficulty achieving upright standing position; requires bed elevated and (A) to maintain balance and  maintain NWB status on Rt LE; pt is impulsive and requries max cues for hand placement and safety; pt performed toilet transfer to 3 in 1; relies heavily on armrests   Ambulation/Gait Ambulation/Gait Assistance: 4: Min assist Ambulation Distance (Feet): 50 Feet Assistive device: Rolling walker Ambulation/Gait Assistance Details: cues for gt sequencing and safety with RW; pt requires (A) to manage RW and maintain balance while maintaining NWB status on Rt LE  Gait Pattern:  (NWB on Rt LE; hop to gt) Gait velocity: decreased General Gait Details: required 2 standing rest breaks due to fatigue in UEs Stairs: No Wheelchair Mobility Wheelchair Mobility: No    Exercises   ankle pumps x10 both AROM to increase blood flow and reduce risks of DVTs   PT Diagnosis:    PT Problem List:   PT Treatment Interventions:     PT Goals (current goals can now be found in the care plan section) Acute Rehab PT Goals Patient Stated Goal: to walk down the whole hallway  PT Goal Formulation: With patient Time For Goal Achievement: 10/19/12 Potential to Achieve Goals: Good  Visit Information  Last PT Received On: 10/13/12 Assistance Needed: +1    Subjective Data  Subjective: Pt lying in bed; max encouragement to participate but then stated "im just going to walk down the hall for you."  Patient Stated Goal: to walk down the whole hallway    Cognition  Cognition Arousal/Alertness: Awake/alert Behavior During Therapy: Impulsive Overall Cognitive Status: Within Functional Limits for tasks assessed    Balance  Balance Balance Assessed: Yes Static Standing Balance Static Standing - Balance Support: Bilateral upper extremity supported;During functional activity Static Standing - Level of Assistance: 4: Min  assist  End of Session PT - End of Session Equipment Utilized During Treatment: Gait belt Activity Tolerance: Patient tolerated treatment well Patient left: in chair;with call bell/phone within  reach;with family/visitor present Nurse Communication: Mobility status   GP Functional Assessment Tool Used: clinical judgement  Functional Limitation: Mobility: Walking and moving around Mobility: Walking and Moving Around Current Status (Q4696): At least 20 percent but less than 40 percent impaired, limited or restricted Mobility: Walking and Moving Around Goal Status [phone]): At least 1 percent but less than 20 percent impaired, limited or restricted Mobility: Walking and Moving Around Discharge Status [phone]): At least 20 percent but less than 40 percent impaired, limited or restricted   Donell Sievert, [place] [phone] 10/13/2012, 1:35 PM

## 2012-10-13 NOTE — Discharge Summary (Signed)
  Discharge delayed do to patient's decreased ability for mobilization. Plan for discharge to home today in stable condition no change in diagnosis no change in condition followup in the office in one week.

## 2012-10-13 NOTE — Progress Notes (Signed)
Order received, chart reviewed, plan is for pt to go to SNF. Will defer OT eval to that facility. Ignacia Palma, [place] [phone] 10/13/2012

## 2012-10-13 NOTE — Clinical Social Work Placement (Signed)
Clinical Social Work Department  CLINICAL SOCIAL WORK PLACEMENT NOTE  Patient:Mark Bean Account Number: [id number]  Admit date: 10/11/12  Clinical Social Worker: Sabino Niemann LCSWA Date/time: 10/12/12 3:30 PM  Clinical Social Work is seeking post-discharge placement for this patient at the following level of care: SKILLED NURSING (*CSW will update this form in Epic as items are completed)  10/12/12 Patient/family provided with [facility] Department of Clinical Social Work's list of facilities offering this level of care within the geographic area requested by the patient (or if unable, by the patient's family).  9/25/14Patient/family informed of their freedom to choose among providers that offer the needed level of care, that participate in Medicare, Medicaid or managed care program needed by the patient, have an available bed and are willing to accept the patient.  10/12/12 Patient/family informed of [facility]' ownership interest in [facility], as well as of the fact that they are under no obligation to receive care at this facility.  PASARR submitted to EDS on 10/12/12  PASARR number received from EDS on 10/12/12  FL2 transmitted to all facilities in geographic area requested by pt/family on 10/12/12  FL2 transmitted to all facilities within larger geographic area on  Patient informed that his/her managed care company has contracts with or will negotiate with certain facilities, including the following:  Patient/family informed of bed offers received: 10/12/12  Patient chooses bed at [facility] Physician recommends and patient chooses bed at  Patient to be transferred to on 10/13/2012 Patient to be transferred to facility by private vehicle The following physician request were entered in Epic:  Additional Comments:  Sabino Niemann, MSW, [employer]  [phone]

## 2012-10-17 ENCOUNTER — Encounter (HOSPITAL_COMMUNITY): Payer: Self-pay | Admitting: Orthopedic Surgery

## 2012-10-18 ENCOUNTER — Encounter: Payer: Self-pay | Admitting: Internal Medicine

## 2012-11-09 ENCOUNTER — Encounter (HOSPITAL_COMMUNITY): Payer: Self-pay | Admitting: *Deleted

## 2012-11-09 ENCOUNTER — Other Ambulatory Visit (HOSPITAL_COMMUNITY): Payer: Self-pay | Admitting: Orthopedic Surgery

## 2012-11-09 ENCOUNTER — Encounter (HOSPITAL_COMMUNITY): Payer: Self-pay | Admitting: Pharmacy Technician

## 2012-11-09 ENCOUNTER — Other Ambulatory Visit (HOSPITAL_COMMUNITY): Payer: Self-pay | Admitting: *Deleted

## 2012-11-09 MED ORDER — CEFAZOLIN SODIUM-DEXTROSE 2-3 GM-% IV SOLR
2.0000 g | INTRAVENOUS | Status: AC
  Administered 2012-11-10: 2 g via INTRAVENOUS
  Filled 2012-11-09: qty 50

## 2012-11-09 NOTE — Progress Notes (Signed)
After getting permission from patient, spoke with patient's wife and she verified med/surgical hx, allergies, meds and I gave her the time of arrival, meds that pt can take in am, NPO after midnight tonight. Pt states she has CHG soap (leftover from recent surgery) and will have pt bathe with it tonight and in the am.

## 2012-11-10 ENCOUNTER — Encounter (HOSPITAL_COMMUNITY): Payer: Self-pay | Admitting: *Deleted

## 2012-11-10 ENCOUNTER — Encounter (HOSPITAL_COMMUNITY): Payer: Medicare Other | Admitting: Certified Registered"

## 2012-11-10 ENCOUNTER — Inpatient Hospital Stay (HOSPITAL_COMMUNITY): Payer: Medicare Other | Admitting: Certified Registered"

## 2012-11-10 ENCOUNTER — Encounter (HOSPITAL_COMMUNITY)
Admission: RE | Disposition: A | Discharge status: Skilled Nursing Facility | Payer: Self-pay | Source: Ambulatory Visit | Attending: Orthopedic Surgery

## 2012-11-10 ENCOUNTER — Inpatient Hospital Stay (HOSPITAL_COMMUNITY)
Admission: RE | Admit: 2012-11-10 | Discharge: 2012-11-14 | DRG: 041 | Disposition: A | Discharge status: Skilled Nursing Facility | Payer: Medicare Other | Source: Ambulatory Visit | Attending: Orthopedic Surgery | Admitting: Orthopedic Surgery

## 2012-11-10 DIAGNOSIS — I252 Old myocardial infarction: Secondary | ICD-10-CM

## 2012-11-10 DIAGNOSIS — E1149 Type 2 diabetes mellitus with other diabetic neurological complication: Principal | ICD-10-CM | POA: Diagnosis present

## 2012-11-10 DIAGNOSIS — M9080 Osteopathy in diseases classified elsewhere, unspecified site: Secondary | ICD-10-CM | POA: Diagnosis present

## 2012-11-10 DIAGNOSIS — E1169 Type 2 diabetes mellitus with other specified complication: Secondary | ICD-10-CM | POA: Diagnosis present

## 2012-11-10 DIAGNOSIS — Z9861 Coronary angioplasty status: Secondary | ICD-10-CM

## 2012-11-10 DIAGNOSIS — Z79899 Other long term (current) drug therapy: Secondary | ICD-10-CM

## 2012-11-10 DIAGNOSIS — Z23 Encounter for immunization: Secondary | ICD-10-CM

## 2012-11-10 DIAGNOSIS — I10 Essential (primary) hypertension: Secondary | ICD-10-CM | POA: Diagnosis present

## 2012-11-10 DIAGNOSIS — Z794 Long term (current) use of insulin: Secondary | ICD-10-CM

## 2012-11-10 DIAGNOSIS — E1142 Type 2 diabetes mellitus with diabetic polyneuropathy: Secondary | ICD-10-CM | POA: Diagnosis present

## 2012-11-10 DIAGNOSIS — L97509 Non-pressure chronic ulcer of other part of unspecified foot with unspecified severity: Secondary | ICD-10-CM | POA: Diagnosis present

## 2012-11-10 DIAGNOSIS — Z7982 Long term (current) use of aspirin: Secondary | ICD-10-CM

## 2012-11-10 DIAGNOSIS — F411 Generalized anxiety disorder: Secondary | ICD-10-CM | POA: Diagnosis present

## 2012-11-10 DIAGNOSIS — Z951 Presence of aortocoronary bypass graft: Secondary | ICD-10-CM

## 2012-11-10 DIAGNOSIS — S91301A Unspecified open wound, right foot, initial encounter: Secondary | ICD-10-CM

## 2012-11-10 DIAGNOSIS — L02619 Cutaneous abscess of unspecified foot: Secondary | ICD-10-CM | POA: Diagnosis present

## 2012-11-10 DIAGNOSIS — Z981 Arthrodesis status: Secondary | ICD-10-CM

## 2012-11-10 DIAGNOSIS — Z96659 Presence of unspecified artificial knee joint: Secondary | ICD-10-CM

## 2012-11-10 DIAGNOSIS — M869 Osteomyelitis, unspecified: Secondary | ICD-10-CM | POA: Diagnosis present

## 2012-11-10 DIAGNOSIS — I2510 Atherosclerotic heart disease of native coronary artery without angina pectoris: Secondary | ICD-10-CM | POA: Diagnosis present

## 2012-11-10 HISTORY — DX: Calculus of kidney: N20.0

## 2012-11-10 HISTORY — PX: I&D EXTREMITY: SHX5045

## 2012-11-10 LAB — COMPREHENSIVE METABOLIC PANEL
ALT: 17 U/L (ref 0–53)
BUN: 15 mg/dL (ref 6–23)
CO2: 26 mEq/L (ref 19–32)
Calcium: 9.4 mg/dL (ref 8.4–10.5)
Creatinine, Ser: 0.93 mg/dL (ref 0.50–1.35)
GFR calc Af Amer: >90 mL/min (ref >90)
GFR calc non Af Amer: 84 mL/min — ABNORMAL LOW (ref >90)
Glucose, Bld: 169 mg/dL — ABNORMAL HIGH (ref 70–99)
Potassium: 4.4 mEq/L (ref 3.5–5.1)
Total Protein: 6.7 g/dL (ref 6.0–8.3)

## 2012-11-10 LAB — GLUCOSE, CAPILLARY
Glucose-Capillary: 163 mg/dL — ABNORMAL HIGH (ref 70–99)
Glucose-Capillary: 144 mg/dL — ABNORMAL HIGH (ref 70–99)
Glucose-Capillary: 134 mg/dL — ABNORMAL HIGH (ref 70–99)

## 2012-11-10 LAB — CBC
HCT: 38.9 % — ABNORMAL LOW (ref 39.0–52.0)
Hemoglobin: 13.2 g/dL (ref 13.0–17.0)
MCH: 28.6 pg (ref 26.0–34.0)
MCHC: 33.9 g/dL (ref 30.0–36.0)
MCV: 84.4 fL (ref 78.0–100.0)
RBC: 4.61 MIL/uL (ref 4.22–5.81)
RDW: 13.3 % (ref 11.5–15.5)

## 2012-11-10 LAB — PROTIME-INR
INR: 1.07 (ref 0.00–1.49)
Prothrombin Time: 13.7 seconds (ref 11.6–15.2)

## 2012-11-10 SURGERY — IRRIGATION AND DEBRIDEMENT EXTREMITY
Anesthesia: General | Site: Foot | Laterality: Right

## 2012-11-10 MED ORDER — MIDAZOLAM HCL 2 MG/2ML IJ SOLN: 0.5000 mg | Freq: Once | INTRAMUSCULAR | Status: DC | PRN

## 2012-11-10 MED ORDER — CITALOPRAM HYDROBROMIDE 20 MG PO TABS
20.0000 mg | ORAL_TABLET | Freq: Every day | ORAL | Status: DC
  Administered 2012-11-11 – 2012-11-14 (×4): 20 mg via ORAL
  Filled 2012-11-10 (×5): qty 1

## 2012-11-10 MED ORDER — ASPIRIN 325 MG PO TABS
325.0000 mg | ORAL_TABLET | Freq: Every day | ORAL | Status: DC
  Administered 2012-11-10 – 2012-11-13 (×4): 325 mg via ORAL
  Filled 2012-11-10 (×5): qty 1

## 2012-11-10 MED ORDER — PROMETHAZINE HCL 25 MG/ML IJ SOLN: 6.2500 mg | INTRAMUSCULAR | Status: DC | PRN

## 2012-11-10 MED ORDER — LIDOCAINE HCL (CARDIAC) 20 MG/ML IV SOLN
INTRAVENOUS | Status: DC | PRN
  Administered 2012-11-10: 60 mg via INTRAVENOUS

## 2012-11-10 MED ORDER — OXYCODONE HCL 5 MG PO TABS: 5.0000 mg | ORAL_TABLET | Freq: Once | ORAL | Status: DC | PRN

## 2012-11-10 MED ORDER — METOCLOPRAMIDE HCL 10 MG PO TABS: 5.0000 mg | ORAL_TABLET | Freq: Three times a day (TID) | ORAL | Status: DC | PRN

## 2012-11-10 MED ORDER — PROPOFOL 10 MG/ML IV BOLUS
INTRAVENOUS | Status: DC | PRN
  Administered 2012-11-10: 150 mg via INTRAVENOUS

## 2012-11-10 MED ORDER — METOCLOPRAMIDE HCL 5 MG/ML IJ SOLN: 5.0000 mg | Freq: Three times a day (TID) | INTRAMUSCULAR | Status: DC | PRN

## 2012-11-10 MED ORDER — HYDROMORPHONE HCL PF 1 MG/ML IJ SOLN
INTRAMUSCULAR | Status: AC
  Filled 2012-11-10: qty 1

## 2012-11-10 MED ORDER — HYDROMORPHONE HCL PF 1 MG/ML IJ SOLN
0.5000 mg | INTRAMUSCULAR | Status: DC | PRN
  Administered 2012-11-10 – 2012-11-12 (×6): 1 mg via INTRAVENOUS
  Filled 2012-11-10 (×6): qty 1

## 2012-11-10 MED ORDER — ONDANSETRON HCL 4 MG/2ML IJ SOLN
INTRAMUSCULAR | Status: DC | PRN
  Administered 2012-11-10: 4 mg via INTRAVENOUS

## 2012-11-10 MED ORDER — LACTATED RINGERS IV SOLN
INTRAVENOUS | Status: DC | PRN
  Administered 2012-11-10 (×2): via INTRAVENOUS

## 2012-11-10 MED ORDER — SODIUM CHLORIDE 0.9 % IV SOLN: INTRAVENOUS | Status: DC

## 2012-11-10 MED ORDER — PHENYLEPHRINE HCL 10 MG/ML IJ SOLN
INTRAMUSCULAR | Status: DC | PRN
  Administered 2012-11-10 (×2): 80 ug via INTRAVENOUS
  Administered 2012-11-10 (×6): 40 ug via INTRAVENOUS

## 2012-11-10 MED ORDER — ARTIFICIAL TEARS OP OINT
TOPICAL_OINTMENT | OPHTHALMIC | Status: DC | PRN
  Administered 2012-11-10: 1 via OPHTHALMIC

## 2012-11-10 MED ORDER — HYDROCODONE-ACETAMINOPHEN 5-325 MG PO TABS
ORAL_TABLET | ORAL | Status: AC
  Filled 2012-11-10: qty 2

## 2012-11-10 MED ORDER — OXYCODONE-ACETAMINOPHEN 5-325 MG PO TABS
1.0000 | ORAL_TABLET | ORAL | Status: DC | PRN
  Administered 2012-11-11 – 2012-11-12 (×9): 2 via ORAL
  Filled 2012-11-10 (×7): qty 2

## 2012-11-10 MED ORDER — FENTANYL CITRATE 0.05 MG/ML IJ SOLN
INTRAMUSCULAR | Status: DC | PRN
  Administered 2012-11-10 (×3): 50 ug via INTRAVENOUS
  Administered 2012-11-10: 25 ug via INTRAVENOUS
  Administered 2012-11-10: 75 ug via INTRAVENOUS

## 2012-11-10 MED ORDER — GENTAMICIN SULFATE 40 MG/ML IJ SOLN
INTRAMUSCULAR | Status: AC
  Filled 2012-11-10: qty 4

## 2012-11-10 MED ORDER — ONDANSETRON HCL 4 MG/2ML IJ SOLN: 4.0000 mg | Freq: Four times a day (QID) | INTRAMUSCULAR | Status: DC | PRN

## 2012-11-10 MED ORDER — INFLUENZA VAC SPLIT QUAD 0.5 ML IM SUSP
0.5000 mL | INTRAMUSCULAR | Status: DC
  Filled 2012-11-10: qty 0.5

## 2012-11-10 MED ORDER — VANCOMYCIN HCL 500 MG IV SOLR
INTRAVENOUS | Status: AC
  Filled 2012-11-10: qty 500

## 2012-11-10 MED ORDER — OXYCODONE HCL 5 MG/5ML PO SOLN: 5.0000 mg | Freq: Once | ORAL | Status: DC | PRN

## 2012-11-10 MED ORDER — EPHEDRINE SULFATE 50 MG/ML IJ SOLN
INTRAMUSCULAR | Status: DC | PRN
  Administered 2012-11-10: 5 mg via INTRAVENOUS
  Administered 2012-11-10 (×2): 10 mg via INTRAVENOUS
  Administered 2012-11-10: 5 mg via INTRAVENOUS

## 2012-11-10 MED ORDER — HYDROMORPHONE HCL PF 1 MG/ML IJ SOLN
0.2500 mg | INTRAMUSCULAR | Status: DC | PRN
  Administered 2012-11-10: 0.5 mg via INTRAVENOUS

## 2012-11-10 MED ORDER — HYDROCODONE-ACETAMINOPHEN 5-325 MG PO TABS
1.0000 | ORAL_TABLET | ORAL | Status: DC | PRN
  Administered 2012-11-10 (×2): 2 via ORAL
  Filled 2012-11-10 (×2): qty 2

## 2012-11-10 MED ORDER — CEFAZOLIN SODIUM-DEXTROSE 2-3 GM-% IV SOLR
2.0000 g | Freq: Four times a day (QID) | INTRAVENOUS | Status: AC
  Administered 2012-11-10: 2 g via INTRAVENOUS
  Filled 2012-11-10 (×3): qty 50

## 2012-11-10 MED ORDER — LACTATED RINGERS IV SOLN
INTRAVENOUS | Status: DC
  Administered 2012-11-10 (×2): via INTRAVENOUS

## 2012-11-10 MED ORDER — METFORMIN HCL 500 MG PO TABS
1000.0000 mg | ORAL_TABLET | Freq: Two times a day (BID) | ORAL | Status: DC
  Administered 2012-11-10 – 2012-11-14 (×8): 1000 mg via ORAL
  Filled 2012-11-10 (×11): qty 2

## 2012-11-10 MED ORDER — SODIUM CHLORIDE 0.9 % IR SOLN
Status: DC | PRN
  Administered 2012-11-10: 1000 mL

## 2012-11-10 MED ORDER — INSULIN GLARGINE 100 UNIT/ML ~~LOC~~ SOLN
60.0000 [IU] | Freq: Every day | SUBCUTANEOUS | Status: DC
  Administered 2012-11-11 – 2012-11-13 (×3): 60 [IU] via SUBCUTANEOUS
  Filled 2012-11-10 (×5): qty 0.6

## 2012-11-10 MED ORDER — SIMVASTATIN 20 MG PO TABS
20.0000 mg | ORAL_TABLET | Freq: Every day | ORAL | Status: DC
  Administered 2012-11-10 – 2012-11-13 (×4): 20 mg via ORAL
  Filled 2012-11-10 (×5): qty 1

## 2012-11-10 MED ORDER — GENTAMICIN SULFATE 40 MG/ML IJ SOLN
INTRAMUSCULAR | Status: DC | PRN
  Administered 2012-11-10: 120 mg via INTRAMUSCULAR

## 2012-11-10 MED ORDER — VANCOMYCIN HCL 500 MG IV SOLR
INTRAVENOUS | Status: DC | PRN
  Administered 2012-11-10: 500 mg

## 2012-11-10 MED ORDER — LISINOPRIL 40 MG PO TABS
40.0000 mg | ORAL_TABLET | Freq: Every day | ORAL | Status: DC
  Administered 2012-11-10 – 2012-11-14 (×5): 40 mg via ORAL
  Filled 2012-11-10 (×5): qty 1

## 2012-11-10 MED ORDER — ONDANSETRON HCL 4 MG PO TABS: 4.0000 mg | ORAL_TABLET | Freq: Four times a day (QID) | ORAL | Status: DC | PRN

## 2012-11-10 MED ORDER — CHLORTHALIDONE 25 MG PO TABS
25.0000 mg | ORAL_TABLET | Freq: Every day | ORAL | Status: DC
  Administered 2012-11-10 – 2012-11-14 (×5): 25 mg via ORAL
  Filled 2012-11-10 (×5): qty 1

## 2012-11-10 MED ORDER — CARVEDILOL 12.5 MG PO TABS
12.5000 mg | ORAL_TABLET | Freq: Two times a day (BID) | ORAL | Status: DC
  Administered 2012-11-10 – 2012-11-14 (×8): 12.5 mg via ORAL
  Filled 2012-11-10 (×11): qty 1

## 2012-11-10 MED ORDER — INSULIN ASPART 100 UNIT/ML ~~LOC~~ SOLN
4.0000 [IU] | Freq: Three times a day (TID) | SUBCUTANEOUS | Status: DC
  Administered 2012-11-10 – 2012-11-13 (×6): 4 [IU] via SUBCUTANEOUS

## 2012-11-10 MED ORDER — MEPERIDINE HCL 25 MG/ML IJ SOLN: 6.2500 mg | INTRAMUSCULAR | Status: DC | PRN

## 2012-11-10 MED ORDER — INSULIN ASPART 100 UNIT/ML ~~LOC~~ SOLN
0.0000 [IU] | Freq: Three times a day (TID) | SUBCUTANEOUS | Status: DC
  Administered 2012-11-10 – 2012-11-11 (×2): 3 [IU] via SUBCUTANEOUS
  Administered 2012-11-11: 5 [IU] via SUBCUTANEOUS
  Administered 2012-11-12 (×3): 3 [IU] via SUBCUTANEOUS
  Administered 2012-11-13: 5 [IU] via SUBCUTANEOUS
  Administered 2012-11-13: 3 [IU] via SUBCUTANEOUS

## 2012-11-10 SURGICAL SUPPLY — 50 items
BLADE SURG 10 STRL SS (BLADE) ×2 IMPLANT
BNDG COHESIVE 4X5 TAN STRL (GAUZE/BANDAGES/DRESSINGS) ×2 IMPLANT
BNDG COHESIVE 6X5 TAN STRL LF (GAUZE/BANDAGES/DRESSINGS) ×4 IMPLANT
BNDG GAUZE STRTCH 6 (GAUZE/BANDAGES/DRESSINGS) ×6 IMPLANT
CANISTER WOUND CARE 500ML ATS (WOUND CARE) ×1 IMPLANT
CLOTH BEACON ORANGE TIMEOUT ST (SAFETY) ×2 IMPLANT
COTTON STERILE ROLL (GAUZE/BANDAGES/DRESSINGS) ×2 IMPLANT
COVER SURGICAL LIGHT HANDLE (MISCELLANEOUS) ×2 IMPLANT
CUFF TOURNIQUET SINGLE 18IN (TOURNIQUET CUFF) ×2 IMPLANT
CUFF TOURNIQUET SINGLE 24IN (TOURNIQUET CUFF) IMPLANT
CUFF TOURNIQUET SINGLE 34IN LL (TOURNIQUET CUFF) IMPLANT
CUFF TOURNIQUET SINGLE 44IN (TOURNIQUET CUFF) IMPLANT
DRAPE INCISE IOBAN 66X45 STRL (DRAPES) ×1 IMPLANT
DRAPE U-SHAPE 47X51 STRL (DRAPES) ×2 IMPLANT
DRSG ADAPTIC 3X8 NADH LF (GAUZE/BANDAGES/DRESSINGS) ×2 IMPLANT
DRSG VAC ATS SM SENSATRAC (GAUZE/BANDAGES/DRESSINGS) ×1 IMPLANT
DURAPREP 26ML APPLICATOR (WOUND CARE) ×2 IMPLANT
ELECT CAUTERY BLADE 6.4 (BLADE) ×1 IMPLANT
ELECT REM PT RETURN 9FT ADLT (ELECTROSURGICAL) ×2
ELECTRODE REM PT RTRN 9FT ADLT (ELECTROSURGICAL) IMPLANT
GLOVE BIOGEL PI IND STRL 9 (GLOVE) ×1 IMPLANT
GLOVE BIOGEL PI INDICATOR 9 (GLOVE) ×1
GLOVE SURG ORTHO 9.0 STRL STRW (GLOVE) ×2 IMPLANT
GOWN PREVENTION PLUS XLARGE (GOWN DISPOSABLE) ×2 IMPLANT
GOWN SRG XL XLNG 56XLVL 4 (GOWN DISPOSABLE) ×1 IMPLANT
GOWN STRL NON-REIN XL XLG LVL4 (GOWN DISPOSABLE) ×2
HANDPIECE INTERPULSE COAX TIP (DISPOSABLE)
KIT BASIN OR (CUSTOM PROCEDURE TRAY) ×2 IMPLANT
KIT ROOM TURNOVER OR (KITS) ×2 IMPLANT
KIT STIMULAN RAPID CURE 5CC (Orthopedic Implant) ×1 IMPLANT
MANIFOLD NEPTUNE II (INSTRUMENTS) ×2 IMPLANT
MICROMATRIX 500MG (Tissue) ×2 IMPLANT
NS IRRIG 1000ML POUR BTL (IV SOLUTION) ×2 IMPLANT
PACK ORTHO EXTREMITY (CUSTOM PROCEDURE TRAY) ×2 IMPLANT
PAD ARMBOARD 7.5X6 YLW CONV (MISCELLANEOUS) ×4 IMPLANT
PADDING CAST COTTON 6X4 STRL (CAST SUPPLIES) ×2 IMPLANT
SET HNDPC FAN SPRY TIP SCT (DISPOSABLE) IMPLANT
SOLUTION PARTIC MCRMTRX 500MG (Tissue) IMPLANT
SPONGE GAUZE 4X4 12PLY (GAUZE/BANDAGES/DRESSINGS) ×2 IMPLANT
SPONGE LAP 18X18 X RAY DECT (DISPOSABLE) ×2 IMPLANT
STOCKINETTE IMPERVIOUS 9X36 MD (GAUZE/BANDAGES/DRESSINGS) ×2 IMPLANT
SUT ETHILON 2 0 PSLX (SUTURE) ×1 IMPLANT
SYR BULB IRRIGATION 50ML (SYRINGE) ×1 IMPLANT
TOWEL OR 17X24 6PK STRL BLUE (TOWEL DISPOSABLE) ×2 IMPLANT
TOWEL OR 17X26 10 PK STRL BLUE (TOWEL DISPOSABLE) ×2 IMPLANT
TUBE ANAEROBIC SPECIMEN COL (MISCELLANEOUS) IMPLANT
TUBE CONNECTING 12X1/4 (SUCTIONS) ×2 IMPLANT
UNDERPAD 30X30 INCONTINENT (UNDERPADS AND DIAPERS) ×2 IMPLANT
WATER STERILE IRR 1000ML POUR (IV SOLUTION) ×2 IMPLANT
YANKAUER SUCT BULB TIP NO VENT (SUCTIONS) ×2 IMPLANT

## 2012-11-10 NOTE — Preoperative (Signed)
Beta Blockers   Reason not to administer Beta Blockers:received coreg today 

## 2012-11-10 NOTE — Transfer of Care (Signed)
Immediate Anesthesia Transfer of Care Note  Patient: Mark Bean  Procedure(s) Performed: Procedure(s) with comments: IRRIGATION AND DEBRIDEMENT EXTREMITY (Right) - Irrigation and Debridement Right Foot Wound,  Antibiotic Beads and Wound VAC  Patient Location: PACU  Anesthesia Type:General  Level of Consciousness: awake, alert  and oriented  Airway & Oxygen Therapy: Patient Spontanous Breathing and Patient connected to nasal cannula oxygen  Post-op Assessment: Report given to PACU RN, Post -op Vital signs reviewed and stable and Patient moving all extremities  Post vital signs: Reviewed and stable  Complications: No apparent anesthesia complications

## 2012-11-10 NOTE — Progress Notes (Signed)
Wound VAC to be left in place over the weekend. I will change wound VAC on Monday for evaluation for discharge.

## 2012-11-10 NOTE — Progress Notes (Signed)
11/10/12 1045  OBSTRUCTIVE SLEEP APNEA  Have you ever been diagnosed with sleep apnea through a sleep study? No  Do you snore loudly (loud enough to be heard through closed doors)?  1  Do you often feel tired, fatigued, or sleepy during the daytime? 0  Has anyone observed you stop breathing during your sleep? 0  Do you have, or are you being treated for high blood pressure? 1  BMI more than 35 kg/m2? 1  Age over 68 years old? 1  Neck circumference greater than 40 cm/18 inches? 1  Gender: 1  Obstructive Sleep Apnea Score 6  Score 4 or greater  Results sent to PCP

## 2012-11-10 NOTE — Op Note (Signed)
OPERATIVE REPORT  DATE OF SURGERY: 11/10/2012  PATIENT:  Mark Bean,  67 y.o. male  PRE-OPERATIVE DIAGNOSIS:  Wound Right Foot  POST-OPERATIVE DIAGNOSIS:  Wound Right Foot  PROCEDURE:  Procedure(s): IRRIGATION AND DEBRIDEMENT EXTREMITY Excision skin soft tissue muscle and bone including navicular talus and metatarsal. Application of 5 cc of stimulant antibiotic beads with 500 mg vancomycin and 160 mg gentamicin. Application of a cell xenograft tissue graft. Local tissue rearrangement to close wounds 7 x 4 cm. Application of wound VAC.   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 Patient has had wound breakdown and presents at this time for revision surgery. Risks and benefits were discussed including persistent infection nonhealing of the wound need for additional surgery. Patient states he understands and wishes to proceed at this time. Description of procedure patient was brought to the operating room and underwent a general anesthetic. After adequate levels of anesthesia were obtained patient's right lower extremity was prepped using DuraPrep and draped into a sterile field. The previous wound was ellipsed out of the exposed skin this left the wound 7 x 4 cm. Using osteotome there was partial resection performed of the exposed talar head and navicular medial cuneiform and base of the first metatarsal. The wound was irrigated with normal saline. Antibiotic beads were placed with stimulant vancomycin and gentamicin. A cell xenograft tissue graft was applied within the wound. Local tissue rearrangement was performed to close the wound with 2-0 nylon. A wound VAC was placed over the incision this was set to -75 mm of suction  this had a good suction fit. Patient was extubated taken to the PACU in stable condition.  PLAN OF CARE: Admit to inpatient   PATIENT DISPOSITION:  PACU - hemodynamically stable.   [name], MD 11/10/2012 2:48 PM

## 2012-11-10 NOTE — Anesthesia Preprocedure Evaluation (Addendum)
Anesthesia Evaluation  Patient identified by MRN, date of birth, ID band Patient awake    Reviewed: Allergy & Precautions, H&P , NPO status , Patient's Chart, lab work & pertinent test results  History of Anesthesia Complications Negative for: history of anesthetic complications  Airway Mallampati: II TM Distance: >3 FB Neck ROM: Full    Dental  (+) Dental Advisory Given   Pulmonary neg pulmonary ROS,  breath sounds clear to auscultation  Pulmonary exam normal       Cardiovascular hypertension, Pt. on medications and Pt. on home beta blockers - angina+ CAD, + Past MI, + Cardiac Stents and + CABG Rhythm:Regular Rate:Normal  '11 stress test: no ischemia, normal LVF and perfusion   Neuro/Psych Anxiety negative neurological ROS     GI/Hepatic negative GI ROS, Neg liver ROS,   Endo/Other  diabetes (glu 163), Insulin Dependent and Oral Hypoglycemic AgentsMorbid obesity  Renal/GU negative Renal ROS     Musculoskeletal   Abdominal (+) + obese,   Peds  Hematology   Anesthesia Other Findings   Reproductive/Obstetrics                          Anesthesia Physical Anesthesia Plan  ASA: III  Anesthesia Plan: General   Post-op Pain Management:    Induction: Intravenous  Airway Management Planned: LMA  Additional Equipment:   Intra-op Plan:   Post-operative Plan:   Informed Consent: I have reviewed the patients History and Physical, chart, labs and discussed the procedure including the risks, benefits and alternatives for the proposed anesthesia with the patient or authorized representative who has indicated his/her understanding and acceptance.   Dental advisory given  Plan Discussed with: CRNA and Surgeon  Anesthesia Plan Comments: (Plan routine monitors, GA- LMA OK)        Anesthesia Quick Evaluation

## 2012-11-10 NOTE — Anesthesia Postprocedure Evaluation (Signed)
  Anesthesia Post-op Note  Patient: Mark Bean  Procedure(s) Performed: Procedure(s) with comments: IRRIGATION AND DEBRIDEMENT EXTREMITY (Right) - Irrigation and Debridement Right Foot Wound,  Antibiotic Beads and Wound VAC  Patient Location: PACU  Anesthesia Type:General  Level of Consciousness: awake, alert , oriented and patient cooperative  Airway and Oxygen Therapy: Patient Spontanous Breathing and Patient connected to nasal cannula oxygen  Post-op Pain: none  Post-op Assessment: Post-op Vital signs reviewed, Patient's Cardiovascular Status Stable, Respiratory Function Stable, Patent Airway, No signs of Nausea or vomiting and Pain level controlled  Post-op Vital Signs: Reviewed and stable  Complications: No apparent anesthesia complications

## 2012-11-10 NOTE — H&P (Signed)
Mark Bean is an 67 y.o. male.   Chief Complaint: Chronic ulceration right foot. 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 Patient failed prolonged conservative therapy and presents at this time for surgical intervention.  Past Medical History  Diagnosis Date  . Myocardial infarction   . Diabetes mellitus without complication   . Arthritis   . Anxiety   . Hypertension     dr Shonna Chock    [id number]  . Kidney stones     Past Surgical History  Procedure Laterality Date  . Cardiac catheterization      2000  . Coronary artery bypass graft      2000   1 stent  . Amputation Right 10/11/2012    Procedure: 2nd ray amputation, medial column fusion and ostectomy right foot;  Surgeon: [name], MD;  Location: [facility] OR;  Service: Orthopedics;  Laterality: Right;  . Ankle fusion Right 10/11/2012    Procedure: ARTHRODESIS ANKLE;  Surgeon: [name], MD;  Location: [facility] OR;  Service: Orthopedics;  Laterality: Right;  . Eye surgery Right     cataract   . Joint replacement Left     knee  . Colonoscopy    . Vasectomy      History reviewed. No pertinent family history. Social History:  reports that he has never smoked. He has never used smokeless tobacco. He reports that he drinks alcohol. He reports that he does not use illicit drugs.  Allergies: No Known Allergies  No prescriptions prior to admission    No results found for this or any previous visit (from the past 48 hour(s)). No results found.  Review of Systems  All other systems reviewed and are negative.    There were no vitals taken for this visit. Physical Exam  Examination patient has palpable pulses. He has a chronic wound of the right foot which has failed conservative therapy. Assessment/Plan Assessment: Chronic wound right foot. Plan will plan for excisional debridement of skin soft tissue and muscle. Plan for application of antibiotic beads allograft skin graft application  of a wound VAC. Patient risks and benefits were discussed including persistent infection neurovascular injury nonhealing of the wound need for additional surgery. Patient states he understands and wished to proceed at this time.  Mark Bean V 11/10/2012, 6:17 AM

## 2012-11-11 LAB — URINALYSIS, ROUTINE W REFLEX MICROSCOPIC
Glucose, UA: NEGATIVE mg/dL
Hgb urine dipstick: NEGATIVE
Leukocytes, UA: NEGATIVE
Protein, ur: NEGATIVE mg/dL
Specific Gravity, Urine: 1.026 (ref 1.005–1.030)
Urobilinogen, UA: 0.2 mg/dL (ref 0.0–1.0)
pH: 5 (ref 5.0–8.0)

## 2012-11-11 LAB — GLUCOSE, CAPILLARY
Glucose-Capillary: 222 mg/dL — ABNORMAL HIGH (ref 70–99)
Glucose-Capillary: 168 mg/dL — ABNORMAL HIGH (ref 70–99)
Glucose-Capillary: 162 mg/dL — ABNORMAL HIGH (ref 70–99)

## 2012-11-11 NOTE — Evaluation (Signed)
Physical Therapy Evaluation Patient Details Name: Mark Bean MRN: [id number] DOB: 08/11/1945 Today's Date: 11/11/2012 Time: 8469-6295 PT Time Calculation (min): 20 min  PT Assessment / Plan / Recommendation History of Present Illness   Pt is a 67 y.o. Male who was adm with chronic ulceration on Rt foot. Pt is s/p I&D Rt LE, application of WOUND VAC, and cell xenograft. Pt was recently adm in September for 2nd ray amputation and Rt ankle fusion. Pt was D/C to rehab prior to returning home. Pt has been mobilizing with knee walker and reports multiple falls.   Clinical Impression  Pt adm due to problems listed above. Pt presents with limited functional mobility due to deficits listed below(see PT problem list). Pt to benefit from skilled PT in acute setting to maximize functional mobility and educate family prior to returning home. Pt hopes to D/C Monday.     PT Assessment  Patient needs continued PT services    Follow Up Recommendations  Home health PT;Supervision/Assistance - 24 hour;Supervision for mobility/OOB    Does the patient have the potential to tolerate intense rehabilitation      Barriers to Discharge        Equipment Recommendations  3in1 (PT);Wheelchair (measurements PT);Wheelchair cushion (measurements PT);Other (comment) (with elevated leg rest)    Recommendations for Other Services OT consult   Frequency Min 3X/week    Precautions / Restrictions Precautions Precautions: Fall Precaution Comments: WOUND VAC Restrictions Weight Bearing Restrictions: Yes RLE Weight Bearing: Non weight bearing   Pertinent Vitals/Pain 8-9/10; patient repositioned for comfort and premedicated      Mobility  Bed Mobility Bed Mobility: Supine to Sit;Sitting - Scoot to Edge of Bed Supine to Sit: 6: Modified independent (Device/Increase time);With rails;HOB flat Sitting - Scoot to Edge of Bed: 6: Modified independent (Device/Increase time) Details for Bed Mobility Assistance: pt  required incr time due to pain; no physical (A) required; pt used handrails to (A) with transfer  Transfers Transfers: Sit to Stand;Stand to Sit;Stand Pivot Transfers Sit to Stand: 4: Min assist;From bed;With upper extremity assist Stand to Sit: 4: Min assist;To chair/3-in-1;With armrests;With upper extremity assist Stand Pivot Transfers: 4: Min assist;From elevated surface;With armrests Details for Transfer Assistance: pt very anxious; requires max cues for sequencing and to maintain NWB status; cues for hand placement and (A) to control descent to chair  Ambulation/Gait Ambulation/Gait Assistance: Not tested (comment) Stairs: No Wheelchair Mobility Wheelchair Mobility: No         PT Diagnosis: Difficulty walking;Acute pain  PT Problem List: Decreased strength;Decreased range of motion;Decreased activity tolerance;Decreased balance;Decreased mobility;Decreased knowledge of use of DME;Pain PT Treatment Interventions: Gait training;DME instruction;Functional mobility training;Therapeutic activities;Therapeutic exercise;Balance training;Neuromuscular re-education;Patient/family education;Wheelchair mobility training;Stair training     PT Goals(Current goals can be found in the care plan section) Acute Rehab PT Goals Patient Stated Goal: to go home and not do rehab again PT Goal Formulation: With patient Time For Goal Achievement: 11/18/12 Potential to Achieve Goals: Good  Visit Information  Last PT Received On: 11/11/12 Assistance Needed: +2 (for safety; pt is anxious )       Prior Functioning  Home Living Family/patient expects to be discharged to:: Private residence Living Arrangements: Spouse/significant other Available Help at Discharge: Family;Available 24 hours/day Type of Home: House Home Access: Stairs to enter [employer] of Steps: 1 Entrance Stairs-Rails: None (threshold step) Home Layout: One level Home Equipment: Walker - 2 wheels;Shower seat (knee  walker ) Additional Comments: pt has walk in shower; standard toilet  seat height  Prior Function Level of Independence: Independent (prior to first admission) Communication Communication: No difficulties Dominant Hand: Left    Cognition  Cognition Arousal/Alertness: Awake/alert Behavior During Therapy: Anxious Overall Cognitive Status: Within Functional Limits for tasks assessed    Extremity/Trunk Assessment Upper Extremity Assessment Upper Extremity Assessment: Defer to OT evaluation Lower Extremity Assessment Lower Extremity Assessment: RLE deficits/detail RLE Deficits / Details: unable to assess Rt ankle  RLE: Unable to fully assess due to pain;Unable to fully assess due to immobilization Cervical / Trunk Assessment Cervical / Trunk Assessment: Normal   Balance Balance Balance Assessed: Yes Static Sitting Balance Static Sitting - Balance Support: No upper extremity supported;Feet unsupported Static Sitting - Level of Assistance: 5: Stand by assistance Static Sitting - Comment/# of Minutes: pt sitting EOB ~4 min  End of Session PT - End of Session Equipment Utilized During Treatment: Gait belt;Other (comment) (wound VAC ) Activity Tolerance: Patient tolerated treatment well Patient left: in chair;with call bell/phone within reach;with family/visitor present Nurse Communication: Mobility status  GP     Donell Sievert, [place] [phone] 11/11/2012, 12:52 PM

## 2012-11-11 NOTE — Progress Notes (Signed)
   Subjective:  Patient reports pain as moderate.  No events  Objective:   VITALS:   Filed Vitals:   11/10/12 1853 11/10/12 2120 11/11/12 0552 11/11/12 1344  BP: 140/70 98/53 108/54 109/51  Pulse:  76 56 83  Temp:  98.2 F (36.8 C) 98.7 F (37.1 C) 98.1 F (36.7 C)  TempSrc:    Oral  Resp:  18 18   Height:      Weight:      SpO2:  99% 99% 95%    VAC with good seal and suction   Lab Results  Component Value Date   WBC 5.4 11/10/2012   HGB 13.2 11/10/2012   HCT 38.9* 11/10/2012   MCV 84.4 11/10/2012   PLT 154 11/10/2012     Assessment/Plan: 1 Day Post-Op   Problem List Items Addressed This Visit   None      Advance diet Up with therapy NWB DVT ppx Pain control Discharge planning - plan for dc Monday at earliest   Cheral Almas 11/11/2012, 8:47 PM [phone]

## 2012-11-11 NOTE — Progress Notes (Signed)
Pt unable to void during the night, scan showed 556 cc. I&O cath obtained 800 cc.

## 2012-11-11 NOTE — Progress Notes (Signed)
Orthopedic Tech Progress Note Patient Details:  Mark Bean 07-Nov-1945 [id number] Pot op shoe delivered to patient. Application fitted for size. No need to wear at this time, patient is in bed and has a drain on foot. Shoe placed on shelf in room.  Ortho Devices Type of Ortho Device: Postop shoe/boot Ortho Device/Splint Interventions: Ordered;Application   [place] 11/11/2012, 12:49 PM

## 2012-11-11 NOTE — Plan of Care (Signed)
Problem: Acute Rehab PT Goals(only PT should resolve) Goal: Pt Will Go Up/Down Stairs Pt and wife to be able to demo and verbalize understanding of stair training with wheelchair to get into house.

## 2012-11-12 LAB — GLUCOSE, CAPILLARY
Glucose-Capillary: 161 mg/dL — ABNORMAL HIGH (ref 70–99)
Glucose-Capillary: 156 mg/dL — ABNORMAL HIGH (ref 70–99)

## 2012-11-12 NOTE — Progress Notes (Signed)
Clinical Social Work Department BRIEF PSYCHOSOCIAL ASSESSMENT 11/12/2012  Patient:  Mark Bean, Mark Bean     Account Number:  [id number]     Admit date:  11/10/2012  Clinical Social Worker:  Hendricks Milo  Date/Time:  11/12/2012 02:54 PM  Referred by:  Physician  Date Referred:  11/12/2012 Referred for  SNF Placement   Other Referral:   Interview type:  Patient Other interview type:    PSYCHOSOCIAL DATA Living Status:  WIFE Admitted from facility:   Level of care:   Primary support name:  Mark Bean [phone] Primary support relationship to patient:  SPOUSE Degree of support available:   Very supportive at bedside.    CURRENT CONCERNS  Other Concerns:    SOCIAL WORK ASSESSMENT / PLAN Clinical Social Worker (CSW) met with patient and his wife Mark Bean to discuss SNF placement. Wife reported that [facility] in [facility] is their first choice because patient has been there before. Patient and wife agreeable to fax out to [facility]   Assessment/plan status:  Psychosocial Support/Ongoing Assessment of Needs Other assessment/ plan:   Information/referral to community resources:   CSW gave wife SNF list.    PATIENT'S/FAMILY'S RESPONSE TO PLAN OF CARE: Patient and wife appreciative of CSW visit.

## 2012-11-12 NOTE — Progress Notes (Signed)
Clinical Social Work Department CLINICAL SOCIAL WORK PLACEMENT NOTE 11/12/2012  Patient:  Mark Bean, Mark Bean  Account Number:  [id number] Admit date:  11/10/2012  Clinical Social Worker:  Jetta Lout, Theresia Majors  Date/time:  11/12/2012 02:58 PM  Clinical Social Work is seeking post-discharge placement for this patient at the following level of care:   SKILLED NURSING   (*CSW will update this form in Epic as items are completed)   11/12/2012  Patient/family provided with [facility] Department of Clinical Social Work's list of facilities offering this level of care within the geographic area requested by the patient (or if unable, by the patient's family).  11/12/2012  Patient/family informed of their freedom to choose among providers that offer the needed level of care, that participate in Medicare, Medicaid or managed care program needed by the patient, have an available bed and are willing to accept the patient.  11/12/2012  Patient/family informed of [facility]' ownership interest in [facility], as well as of the fact that they are under no obligation to receive care at this facility.  PASARR submitted to EDS on  PASARR number received from EDS on   FL2 transmitted to all facilities in geographic area requested by pt/family on  11/12/2012 FL2 transmitted to all facilities within larger geographic area on   Patient informed that his/her managed care company has contracts with or will negotiate with  certain facilities, including the following:     Patient/family informed of bed offers received:   Patient chooses bed at  Physician recommends and patient chooses bed at    Patient to be transferred to  on   Patient to be transferred to facility by   The following physician request were entered in Epic:   Additional Comments: Patient had existing PASARR. [facility] in [place] is patient and wife's first choice.

## 2012-11-12 NOTE — Progress Notes (Signed)
Physical Therapy Treatment Patient Details Name: Mark Bean MRN: [id number] DOB: 10/20/1945 Today's Date: 11/12/2012 Time: 4098-1191 PT Time Calculation (min): 29 min  PT Assessment / Plan / Recommendation  History of Present Illness     PT Comments   Pt highly motivated to increase mobility today. Pt able to amb into/out of bathroom with min (A). Pt fearful of falling and is very anxious with mobility. Pt reports that he does not believe wife will be willing or able to provide amount of (A) needed for him to safely return home. Pt is agreeable to ST SNF at this time to increase independence with transfers an mobility. CSW notified. Will cont to f/u with pt per POC.   Follow Up Recommendations  SNF;Supervision for mobility/OOB     Does the patient have the potential to tolerate intense rehabilitation     Barriers to Discharge        Equipment Recommendations  3in1 (PT);Wheelchair (measurements PT);Wheelchair cushion (measurements PT);Other (comment)    Recommendations for Other Services OT consult  Frequency Min 3X/week   Progress towards PT Goals Progress towards PT goals: Progressing toward goals  Plan Discharge plan needs to be updated    Precautions / Restrictions Precautions Precautions: Fall Precaution Comments: WOUND VAC Restrictions Weight Bearing Restrictions: Yes RLE Weight Bearing: Non weight bearing   Pertinent Vitals/Pain 0/10 pt repositioned in chair for comfort; Rt LE elevated    Mobility  Bed Mobility Bed Mobility: Supine to Sit;Sitting - Scoot to Edge of Bed Supine to Sit: 6: Modified independent (Device/Increase time);With rails;HOB elevated Sitting - Scoot to Edge of Bed: 6: Modified independent (Device/Increase time) Details for Bed Mobility Assistance: pt requires incr time and cues for hand placement and sequencing ; pt requires HOB elevated and handrails  Transfers Transfers: Sit to Stand;Stand to Sit Sit to Stand: 4: Min assist;From bed;From  elevated surface;With upper extremity assist Stand to Sit: 4: Min assist;To chair/3-in-1;With armrests;With upper extremity assist;To toilet Details for Transfer Assistance: pt very anxious and fearful of falling; cues for hand placement and sequencing for safety; pt does good job maintaining NWB status independently; pt requires handicap railing for sit <> stand from toilet  Ambulation/Gait Ambulation/Gait Assistance: 4: Min assist Ambulation Distance (Feet): 14 Feet (x2) Assistive device: Rolling walker Ambulation/Gait Assistance Details: cues for gt sequencing and RW management; pt hops and shuffles Lt LE for steps; does good job maintaining NWB status; min (A) to steady and balance; pt very anxious and fearful of falling  Gait Pattern:  (hop to) Gait velocity: very decreased Stairs: No Wheelchair Mobility Wheelchair Mobility: No         PT Diagnosis:    PT Problem List:   PT Treatment Interventions:     PT Goals (current goals can now be found in the care plan section) Acute Rehab PT Goals Patient Stated Goal: to not hurt this foot any worse PT Goal Formulation: With patient Time For Goal Achievement: 11/18/12 Potential to Achieve Goals: Good  Visit Information  Last PT Received On: 11/12/12 Assistance Needed: +2 (for safety; pt is anxious)    Subjective Data  Subjective: pt lying supine; "what are we going to do today. i think we could get into the bathroom and not on the bedside"  Patient Stated Goal: to not hurt this foot any worse   Cognition  Cognition Arousal/Alertness: Awake/alert Behavior During Therapy: Anxious Overall Cognitive Status: Within Functional Limits for tasks assessed    Balance  Balance Balance Assessed: No  End of Session PT - End of Session Equipment Utilized During Treatment: Gait belt;Other (comment) (wound vac) Activity Tolerance: Patient tolerated treatment well Patient left: in chair;with call bell/phone within reach Nurse  Communication: Mobility status   GP     Donell Sievert, [place] [phone] 11/12/2012, 1:08 PM

## 2012-11-12 NOTE — Progress Notes (Signed)
   Subjective:  Patient reports pain as moderate.  No events  Objective:   VITALS:   Filed Vitals:   11/11/12 2133 11/12/12 0523 11/12/12 1007 11/12/12 1008  BP: 107/47 106/47  114/58  Pulse: 87 83 72 72  Temp: 99.3 F (37.4 C) 98.4 F (36.9 C)    TempSrc: Oral Oral    Resp: 18 18 16    Height:      Weight:      SpO2: 98% 97% 93% 87%    VAC with good seal and suction   Lab Results  Component Value Date   WBC 5.4 11/10/2012   HGB 13.2 11/10/2012   HCT 38.9* 11/10/2012   MCV 84.4 11/10/2012   PLT 154 11/10/2012     Assessment/Plan: 2 Days Post-Op   Problem List Items Addressed This Visit   None      Advance diet Up with therapy NWB DVT ppx Pain control Discharge planning - plan for dc Monday at earliest   Cheral Almas 11/12/2012, 10:42 AM [phone]

## 2012-11-13 ENCOUNTER — Encounter (HOSPITAL_COMMUNITY): Payer: Self-pay | Admitting: Orthopedic Surgery

## 2012-11-13 LAB — GLUCOSE, CAPILLARY
Glucose-Capillary: 110 mg/dL — ABNORMAL HIGH (ref 70–99)
Glucose-Capillary: 214 mg/dL — ABNORMAL HIGH (ref 70–99)
Glucose-Capillary: 160 mg/dL — ABNORMAL HIGH (ref 70–99)
Glucose-Capillary: 145 mg/dL — ABNORMAL HIGH (ref 70–99)

## 2012-11-13 MED ORDER — OXYCODONE-ACETAMINOPHEN 5-325 MG PO TABS: 1.0000 | ORAL_TABLET | ORAL | Status: DC | PRN

## 2012-11-13 MED ORDER — INFLUENZA VAC SPLIT QUAD 0.5 ML IM SUSP
0.5000 mL | INTRAMUSCULAR | Status: AC
  Administered 2012-11-14: 0.5 mL via INTRAMUSCULAR
  Filled 2012-11-13 (×3): qty 0.5

## 2012-11-13 MED ORDER — BISACODYL 5 MG PO TBEC
5.0000 mg | DELAYED_RELEASE_TABLET | Freq: Every day | ORAL | Status: DC | PRN
  Administered 2012-11-13: 5 mg via ORAL
  Filled 2012-11-13: qty 1

## 2012-11-13 NOTE — Progress Notes (Signed)
PT Cancellation Note  Patient Details Name: Mark Bean MRN: [id number] DOB: [birth year]   Cancelled Treatment:    Reason Eval/Treat Not Completed: Patient declined, no reason specified. Plan is to d/c to SNF today.    Ruthann Cancer 11/13/2012, 11:00 AM  Ruthann Cancer, PT, DPT Acute Rehabilitation Services [phone]

## 2012-11-13 NOTE — OR Nursing (Signed)
Late entry by T. Keandre Linden,RN for [facility] [facility] 11/13/2012 @1258 .

## 2012-11-13 NOTE — Progress Notes (Signed)
WE are still awaiting BCBS auth and a bed offer.  Sabino Niemann, MSW, [employer] [phone]

## 2012-11-13 NOTE — Progress Notes (Signed)
UR COMPLETED  

## 2012-11-13 NOTE — Discharge Summary (Signed)
Physician Discharge Summary  Patient ID: Mark Bean MRN: [id number] DOB/AGE: 01-23-1945 67 y.o.  Admit date: 11/10/2012 Discharge date: 11/13/2012  Admission Diagnoses: Abscess osteomyelitis right foot wound  Discharge Diagnoses: Abscess osteomyelitis right foot wound. Active Problems:   * No active hospital problems. *   Discharged Condition: stable  Hospital Course: Patient's hospital course was essentially unremarkable. He underwent excision of bone necrotic tissue placement of antibiotic beads placement of xenograft skin graft with local tissue rearrangement and application of wound VAC. Patient was unable to independently ambulate and was discharged to skilled nursing in stable condition  Consults: None  Significant Diagnostic Studies: labs: Routine labs  Treatments: surgery: See operative note  Discharge Exam: Blood pressure 172/86, pulse 79, temperature 97.6 F (36.4 C), temperature source Oral, resp. rate 17, height 5\' 8"  (1.727 m), weight 107.502 kg (237 lb), SpO2 97.00%. Incision/Wound: incision clean dry and intact  Disposition: 01-Home or Self Care  Discharge Orders   Future Orders Complete By Expires   Call MD / Call 911  As directed    Comments:     If you experience chest pain or shortness of breath, CALL 911 and be transported to the hospital emergency room.  If you develope a fever above 101 F, pus (white drainage) or increased drainage or redness at the wound, or calf pain, call your surgeon's office.   Constipation Prevention  As directed    Comments:     Drink plenty of fluids.  Prune juice may be helpful.  You may use a stool softener, such as Colace (over the counter) 100 mg twice a day.  Use MiraLax (over the counter) for constipation as needed.   Diet - low sodium heart healthy  As directed    Increase activity slowly as tolerated  As directed        Medication List         aspirin 325 MG tablet  Take 325 mg by mouth at bedtime.     carvedilol 12.5 MG tablet  Commonly known as:  COREG  Take 12.5 mg by mouth 2 (two) times daily with a meal.     chlorthalidone 25 MG tablet  Commonly known as:  HYGROTON  Take 25 mg by mouth daily.     ciprofloxacin 500 MG tablet  Commonly known as:  CIPRO  Take 500 mg by mouth 2 (two) times daily.     citalopram 20 MG tablet  Commonly known as:  CELEXA  Take 20 mg by mouth daily.     doxycycline 100 MG tablet  Commonly known as:  VIBRA-TABS  Take 100 mg by mouth 2 (two) times daily.     FISH OIL TRIPLE STRENGTH 1400 MG Caps  Take 1,400 mg by mouth daily.     insulin aspart 100 UNIT/ML injection  Commonly known as:  novoLOG  Inject 30 Units into the skin 3 (three) times daily with meals.     insulin glargine 100 UNIT/ML injection  Commonly known as:  LANTUS  Inject 60 Units into the skin at bedtime.     lisinopril 40 MG tablet  Commonly known as:  PRINIVIL,ZESTRIL  Take 40 mg by mouth daily.     metFORMIN 1000 MG tablet  Commonly known as:  GLUCOPHAGE  Take 1,000 mg by mouth 2 (two) times daily with a meal.     oxyCODONE-acetaminophen 5-325 MG per tablet  Commonly known as:  ROXICET  Take 1 tablet by mouth every 4 (four) hours as  needed for pain.     oxyCODONE-acetaminophen 5-325 MG per tablet  Commonly known as:  ROXICET  Take 1 tablet by mouth every 4 (four) hours as needed for pain.     simvastatin 40 MG tablet  Commonly known as:  ZOCOR  Take 20 mg by mouth at bedtime.           Follow-up Information   Follow up with Maniyah Moller V, MD In 2 weeks.   Specialty:  Orthopedic Surgery   Contact information:   [address] [place] [address] [phone]       Signed: [name] 11/13/2012, 6:50 AM

## 2012-11-14 ENCOUNTER — Encounter (HOSPITAL_COMMUNITY): Payer: Self-pay | Admitting: General Practice

## 2012-11-14 LAB — GLUCOSE, CAPILLARY: Glucose-Capillary: 80 mg/dL (ref 70–99)

## 2012-11-14 NOTE — Progress Notes (Signed)
Physical Therapy Treatment Patient Details Name: Mark Bean MRN: [id number] DOB: 1945/05/15 Today's Date: 11/14/2012 Time: 4098-1191 PT Time Calculation (min): 14 min  PT Assessment / Plan / Recommendation  History of Present Illness Pt is a 67 y/o male admitted for I&D of R foot.    PT Comments   Pt progressing towards physical therapy goals. 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 Tolerated therapeutic exercise well, and did not require any physical assist with AROM, however was cued for speed and technique.  Follow Up Recommendations  SNF;Supervision for mobility/OOB     Does the patient have the potential to tolerate intense rehabilitation     Barriers to Discharge        Equipment Recommendations  3in1 (PT);Wheelchair (measurements PT);Wheelchair cushion (measurements PT);Other (comment)    Recommendations for Other Services    Frequency Min 3X/week   Progress towards PT Goals Progress towards PT goals: Progressing toward goals  Plan Current plan remains appropriate    Precautions / Restrictions Precautions Precautions: Fall Precaution Comments: Has surgical shoe for comfort Restrictions Weight Bearing Restrictions: Yes RLE Weight Bearing: Non weight bearing   Pertinent Vitals/Pain 5/10 at rest    Mobility  Bed Mobility Bed Mobility: Not assessed Details for Bed Mobility Assistance: Pt received up in recliner Transfers Transfers: Sit to Stand;Stand to Sit Sit to Stand: 4: Min guard;From chair/3-in-1;With upper extremity assist Stand to Sit: 4: Min guard;To chair/3-in-1;With upper extremity assist Details for Transfer Assistance: Pt demonstrated proper hand placement and safety awareness. Was able to maintain NWB status well. 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 After gait training pt reports he is winded upon sitting down. Gait Pattern: Step-to pattern (2-point gait pattern as pt is NWB) Gait velocity: Decreased Stairs: No    Exercises General Exercises - Lower Extremity Ankle Circles/Pumps: 10 reps Long Arc Quad: 20 reps Heel Slides: 20 reps Hip ABduction/ADduction: 20 reps (Isometric adduction with pillow) Straight Leg Raises: 20 reps   PT Diagnosis:    PT Problem List:   PT Treatment Interventions:     PT Goals (current goals can now be found in the care plan section) Acute Rehab PT Goals Patient Stated Goal: to not hurt this foot any worse PT Goal Formulation: With patient Time For Goal Achievement: 11/18/12 Potential to Achieve Goals: Good  Visit Information  Last PT Received On: 11/14/12 Assistance Needed: +2 (for safety; pt is anxious) History of Present Illness: Pt is a 67 y/o male admitted for I&D of R foot.     Subjective Data  Subjective: "I could walk, but I dont need to." Patient Stated Goal: to not hurt this foot any worse   Cognition  Cognition Arousal/Alertness: Awake/alert Behavior During Therapy: Anxious Overall Cognitive Status: Within Functional Limits for tasks assessed    Balance  Balance Balance Assessed: No  End of Session PT - End of Session Equipment Utilized During Treatment: Gait belt Activity Tolerance: Patient tolerated treatment well Patient left: in chair;with call bell/phone within reach Nurse Communication: Mobility status   GP     Ruthann Cancer 11/14/2012, 10:24 AM  Ruthann Cancer, PT, DPT Acute Rehabilitation Services [phone]

## 2012-11-14 NOTE — Progress Notes (Signed)
When I saw that the wound vac was on the floor along with 2 pillows I ask pt what happened to the vac he said I took it off when I kicked the pillows. I unwrapped his foot and saw that it was sutured and asked pt again about the vac that his foot was stitched up. He then said that it was taken of yesterday I redressed his wound with abd pad and wrapped in ace the old dressing was stained. Ilean Skill LPN

## 2012-11-14 NOTE — Progress Notes (Signed)
Patient ID: Mark Bean, male   DOB: Oct 06, 1945, 67 y.o.   MRN: [id number] Awaiting authorization for discharge to skilled nursing facility. Dressing changed today. Continue with daily dressing changes.

## 2013-01-04 ENCOUNTER — Encounter (HOSPITAL_COMMUNITY): Payer: Self-pay | Admitting: *Deleted

## 2013-01-04 ENCOUNTER — Encounter (HOSPITAL_COMMUNITY): Payer: Self-pay | Admitting: Pharmacy Technician

## 2013-01-04 ENCOUNTER — Other Ambulatory Visit (HOSPITAL_COMMUNITY): Payer: Self-pay | Admitting: Orthopedic Surgery

## 2013-01-05 ENCOUNTER — Ambulatory Visit (HOSPITAL_COMMUNITY): Payer: Medicare Other | Admitting: Anesthesiology

## 2013-01-05 ENCOUNTER — Encounter (HOSPITAL_COMMUNITY): Payer: Medicare Other | Admitting: Anesthesiology

## 2013-01-05 ENCOUNTER — Encounter (HOSPITAL_COMMUNITY)
Admission: RE | Disposition: A | Discharge status: Home / Self Care | Payer: Self-pay | Source: Ambulatory Visit | Attending: Orthopedic Surgery

## 2013-01-05 ENCOUNTER — Ambulatory Visit (HOSPITAL_COMMUNITY)
Admission: RE | Admit: 2013-01-05 | Discharge: 2013-01-05 | Disposition: A | Discharge status: Home / Self Care | Payer: Medicare Other | Source: Ambulatory Visit | Attending: Orthopedic Surgery | Admitting: Orthopedic Surgery

## 2013-01-05 ENCOUNTER — Encounter (HOSPITAL_COMMUNITY): Payer: Self-pay

## 2013-01-05 DIAGNOSIS — M869 Osteomyelitis, unspecified: Secondary | ICD-10-CM | POA: Insufficient documentation

## 2013-01-05 DIAGNOSIS — M9080 Osteopathy in diseases classified elsewhere, unspecified site: Secondary | ICD-10-CM | POA: Insufficient documentation

## 2013-01-05 DIAGNOSIS — Z794 Long term (current) use of insulin: Secondary | ICD-10-CM | POA: Insufficient documentation

## 2013-01-05 DIAGNOSIS — E1149 Type 2 diabetes mellitus with other diabetic neurological complication: Secondary | ICD-10-CM | POA: Insufficient documentation

## 2013-01-05 DIAGNOSIS — E1169 Type 2 diabetes mellitus with other specified complication: Secondary | ICD-10-CM | POA: Insufficient documentation

## 2013-01-05 DIAGNOSIS — I10 Essential (primary) hypertension: Secondary | ICD-10-CM | POA: Insufficient documentation

## 2013-01-05 DIAGNOSIS — M86171 Other acute osteomyelitis, right ankle and foot: Secondary | ICD-10-CM

## 2013-01-05 DIAGNOSIS — E1142 Type 2 diabetes mellitus with diabetic polyneuropathy: Secondary | ICD-10-CM | POA: Insufficient documentation

## 2013-01-05 HISTORY — PX: HARDWARE REMOVAL: SHX979

## 2013-01-05 LAB — CBC
Hemoglobin: 13.8 g/dL (ref 13.0–17.0)
MCH: 28.2 pg (ref 26.0–34.0)
MCHC: 33.8 g/dL (ref 30.0–36.0)
MCV: 83.4 fL (ref 78.0–100.0)
Platelets: 208 10*3/uL (ref 150–400)

## 2013-01-05 LAB — COMPREHENSIVE METABOLIC PANEL
AST: 21 U/L (ref 0–37)
Alkaline Phosphatase: 59 U/L (ref 39–117)
Calcium: 10 mg/dL (ref 8.4–10.5)
Creatinine, Ser: 0.92 mg/dL (ref 0.50–1.35)
Glucose, Bld: 139 mg/dL — ABNORMAL HIGH (ref 70–99)
Potassium: 4 mEq/L (ref 3.5–5.1)
Sodium: 139 mEq/L (ref 135–145)
Total Bilirubin: 0.3 mg/dL (ref 0.3–1.2)
Total Protein: 7.5 g/dL (ref 6.0–8.3)

## 2013-01-05 LAB — GLUCOSE, CAPILLARY
Glucose-Capillary: 137 mg/dL — ABNORMAL HIGH (ref 70–99)
Glucose-Capillary: 119 mg/dL — ABNORMAL HIGH (ref 70–99)

## 2013-01-05 LAB — PROTIME-INR: INR: 1.01 (ref 0.00–1.49)

## 2013-01-05 SURGERY — REMOVAL, HARDWARE
Anesthesia: General | Site: Foot | Laterality: Right

## 2013-01-05 MED ORDER — EPHEDRINE SULFATE 50 MG/ML IJ SOLN
INTRAMUSCULAR | Status: DC | PRN
  Administered 2013-01-05: 10 mg via INTRAVENOUS
  Administered 2013-01-05 (×3): 5 mg via INTRAVENOUS

## 2013-01-05 MED ORDER — LACTATED RINGERS IV SOLN
INTRAVENOUS | Status: DC | PRN
  Administered 2013-01-05: 17:00:00 via INTRAVENOUS

## 2013-01-05 MED ORDER — CEFAZOLIN SODIUM-DEXTROSE 2-3 GM-% IV SOLR: 2.0000 g | INTRAVENOUS | Status: DC

## 2013-01-05 MED ORDER — HYDROMORPHONE HCL PF 1 MG/ML IJ SOLN
INTRAMUSCULAR | Status: AC
  Filled 2013-01-05: qty 1

## 2013-01-05 MED ORDER — DOXYCYCLINE HYCLATE 50 MG PO CAPS: 100.0000 mg | ORAL_CAPSULE | Freq: Two times a day (BID) | ORAL | Status: DC

## 2013-01-05 MED ORDER — GENTAMICIN SULFATE 40 MG/ML IJ SOLN
INTRAMUSCULAR | Status: AC
Start: 2013-01-05 — End: 2013-01-05
  Filled 2013-01-05: qty 4

## 2013-01-05 MED ORDER — GENTAMICIN SULFATE 40 MG/ML IJ SOLN
INTRAMUSCULAR | Status: DC | PRN
  Administered 2013-01-05: 160 mg

## 2013-01-05 MED ORDER — LACTATED RINGERS IV SOLN
INTRAVENOUS | Status: DC
  Administered 2013-01-05: 15:00:00 via INTRAVENOUS

## 2013-01-05 MED ORDER — PROPOFOL 10 MG/ML IV BOLUS
INTRAVENOUS | Status: DC | PRN
  Administered 2013-01-05: 200 mg via INTRAVENOUS

## 2013-01-05 MED ORDER — ONDANSETRON HCL 4 MG/2ML IJ SOLN
INTRAMUSCULAR | Status: DC | PRN
  Administered 2013-01-05: 4 mg via INTRAVENOUS

## 2013-01-05 MED ORDER — FENTANYL CITRATE 0.05 MG/ML IJ SOLN
INTRAMUSCULAR | Status: DC | PRN
  Administered 2013-01-05: 50 ug via INTRAVENOUS
  Administered 2013-01-05: 100 ug via INTRAVENOUS
  Administered 2013-01-05 (×2): 50 ug via INTRAVENOUS

## 2013-01-05 MED ORDER — 0.9 % SODIUM CHLORIDE (POUR BTL) OPTIME
TOPICAL | Status: DC | PRN
  Administered 2013-01-05: 1000 mL

## 2013-01-05 MED ORDER — METOCLOPRAMIDE HCL 5 MG/ML IJ SOLN: 10.0000 mg | Freq: Once | INTRAMUSCULAR | Status: DC | PRN

## 2013-01-05 MED ORDER — VANCOMYCIN HCL 500 MG IV SOLR
INTRAVENOUS | Status: DC | PRN
  Administered 2013-01-05: 500 mg

## 2013-01-05 MED ORDER — LIDOCAINE HCL (CARDIAC) 20 MG/ML IV SOLN
INTRAVENOUS | Status: DC | PRN
  Administered 2013-01-05: 100 mg via INTRAVENOUS

## 2013-01-05 MED ORDER — VANCOMYCIN HCL 500 MG IV SOLR
INTRAVENOUS | Status: AC
  Filled 2013-01-05: qty 500

## 2013-01-05 MED ORDER — OXYCODONE HCL 5 MG PO TABS
ORAL_TABLET | ORAL | Status: AC
  Filled 2013-01-05: qty 1

## 2013-01-05 MED ORDER — OXYCODONE HCL 5 MG PO TABS
5.0000 mg | ORAL_TABLET | Freq: Once | ORAL | Status: AC | PRN
  Administered 2013-01-05: 5 mg via ORAL

## 2013-01-05 MED ORDER — CEFAZOLIN SODIUM-DEXTROSE 2-3 GM-% IV SOLR
INTRAVENOUS | Status: AC
  Administered 2013-01-05: 2 g via INTRAVENOUS
  Filled 2013-01-05: qty 50

## 2013-01-05 MED ORDER — HYDROMORPHONE HCL PF 1 MG/ML IJ SOLN
0.2500 mg | INTRAMUSCULAR | Status: DC | PRN
  Administered 2013-01-05: 0.5 mg via INTRAVENOUS

## 2013-01-05 MED ORDER — OXYCODONE-ACETAMINOPHEN 5-325 MG PO TABS: 1.0000 | ORAL_TABLET | ORAL | Status: DC | PRN

## 2013-01-05 MED ORDER — OXYCODONE HCL 5 MG/5ML PO SOLN: 5.0000 mg | Freq: Once | ORAL | Status: AC | PRN

## 2013-01-05 SURGICAL SUPPLY — 47 items
BANDAGE ELASTIC 4 VELCRO ST LF (GAUZE/BANDAGES/DRESSINGS) IMPLANT
BANDAGE ELASTIC 6 VELCRO ST LF (GAUZE/BANDAGES/DRESSINGS) IMPLANT
BANDAGE ESMARK 6X9 LF (GAUZE/BANDAGES/DRESSINGS) IMPLANT
BANDAGE GAUZE ELAST BULKY 4 IN (GAUZE/BANDAGES/DRESSINGS) ×2 IMPLANT
BNDG CMPR 9X6 STRL LF SNTH (GAUZE/BANDAGES/DRESSINGS)
BNDG COHESIVE 4X5 TAN STRL (GAUZE/BANDAGES/DRESSINGS) ×1 IMPLANT
BNDG ESMARK 6X9 LF (GAUZE/BANDAGES/DRESSINGS)
CLOTH BEACON ORANGE TIMEOUT ST (SAFETY) ×2 IMPLANT
COVER SURGICAL LIGHT HANDLE (MISCELLANEOUS) ×2 IMPLANT
CUFF TOURNIQUET SINGLE 34IN LL (TOURNIQUET CUFF) IMPLANT
CUFF TOURNIQUET SINGLE 44IN (TOURNIQUET CUFF) IMPLANT
DRAPE C-ARM 42X72 X-RAY (DRAPES) IMPLANT
DRAPE INCISE IOBAN 66X45 STRL (DRAPES) IMPLANT
DRAPE ORTHO SPLIT 77X108 STRL (DRAPES)
DRAPE SURG ORHT 6 SPLT 77X108 (DRAPES) IMPLANT
DRSG ADAPTIC 3X8 NADH LF (GAUZE/BANDAGES/DRESSINGS) ×1 IMPLANT
DRSG EMULSION OIL 3X3 NADH (GAUZE/BANDAGES/DRESSINGS) ×1 IMPLANT
DRSG PAD ABDOMINAL 8X10 ST (GAUZE/BANDAGES/DRESSINGS) ×2 IMPLANT
ELECT REM PT RETURN 9FT ADLT (ELECTROSURGICAL) ×2
ELECTRODE REM PT RTRN 9FT ADLT (ELECTROSURGICAL) ×1 IMPLANT
GLOVE BIOGEL PI IND STRL 9 (GLOVE) ×1 IMPLANT
GLOVE BIOGEL PI INDICATOR 9 (GLOVE) ×1
GLOVE SURG ORTHO 9.0 STRL STRW (GLOVE) ×2 IMPLANT
GOWN PREVENTION PLUS XLARGE (GOWN DISPOSABLE) ×2 IMPLANT
GOWN SRG XL XLNG 56XLVL 4 (GOWN DISPOSABLE) ×2 IMPLANT
GOWN STRL NON-REIN XL XLG LVL4 (GOWN DISPOSABLE) ×4
KIT BASIN OR (CUSTOM PROCEDURE TRAY) ×2 IMPLANT
KIT ROOM TURNOVER OR (KITS) ×2 IMPLANT
KIT STIMULAN RAPID CURE 5CC (Orthopedic Implant) ×1 IMPLANT
MANIFOLD NEPTUNE II (INSTRUMENTS) ×1 IMPLANT
NS IRRIG 1000ML POUR BTL (IV SOLUTION) ×2 IMPLANT
PACK ORTHO EXTREMITY (CUSTOM PROCEDURE TRAY) ×2 IMPLANT
PAD ARMBOARD 7.5X6 YLW CONV (MISCELLANEOUS) ×4 IMPLANT
PAD CAST 4YDX4 CTTN HI CHSV (CAST SUPPLIES) ×1 IMPLANT
PADDING CAST COTTON 4X4 STRL (CAST SUPPLIES) ×2
SPONGE GAUZE 4X4 12PLY (GAUZE/BANDAGES/DRESSINGS) ×2 IMPLANT
SPONGE LAP 18X18 X RAY DECT (DISPOSABLE) ×2 IMPLANT
STAPLER VISISTAT 35W (STAPLE) IMPLANT
STOCKINETTE IMPERVIOUS 9X36 MD (GAUZE/BANDAGES/DRESSINGS) IMPLANT
SUT ETHILON 2 0 PSLX (SUTURE) ×2 IMPLANT
SUT VIC AB 0 CT1 27 (SUTURE)
SUT VIC AB 0 CT1 27XBRD ANBCTR (SUTURE) IMPLANT
SUT VIC AB 2-0 CT1 27 (SUTURE)
SUT VIC AB 2-0 CT1 TAPERPNT 27 (SUTURE) IMPLANT
TOWEL OR 17X24 6PK STRL BLUE (TOWEL DISPOSABLE) ×2 IMPLANT
TOWEL OR 17X26 10 PK STRL BLUE (TOWEL DISPOSABLE) ×2 IMPLANT
WATER STERILE IRR 1000ML POUR (IV SOLUTION) ×1 IMPLANT

## 2013-01-05 NOTE — Progress Notes (Signed)
Pt. rec'd insulin- novolin15 units. this a.m., at 0845, with breakfast.

## 2013-01-05 NOTE — Anesthesia Postprocedure Evaluation (Signed)
  Anesthesia Post-op Note  Patient: Mark Bean  Procedure(s) Performed: Procedure(s) with comments: HARDWARE REMOVAL (Right) - Right Foot Debridement, Placement antibiotic beads, remove screw  Patient Location: PACU  Anesthesia Type:General  Level of Consciousness: awake, alert , oriented and patient cooperative  Airway and Oxygen Therapy: Patient Spontanous Breathing and Patient connected to nasal cannula oxygen  Post-op Pain: mild  Post-op Assessment: Post-op Vital signs reviewed, Patient's Cardiovascular Status Stable, Respiratory Function Stable, Patent Airway, No signs of Nausea or vomiting and Pain level controlled  Post-op Vital Signs: Reviewed and stable  Complications: No apparent anesthesia complications

## 2013-01-05 NOTE — Anesthesia Procedure Notes (Signed)
Procedure Name: LMA Insertion Date/Time: 01/05/2013 5:36 PM Performed by: Orvilla Fus A Pre-anesthesia Checklist: Patient identified, Timeout performed, Emergency Drugs available, Suction available and Patient being monitored Patient Re-evaluated:Patient Re-evaluated prior to inductionOxygen Delivery Method: Circle system utilized Preoxygenation: Pre-oxygenation with 100% oxygen Intubation Type: IV induction Ventilation: Mask ventilation without difficulty LMA: LMA inserted LMA Size: 5.0 Number of attempts: 1 Placement Confirmation: breath sounds checked- equal and bilateral and positive ETCO2 Tube secured with: Tape Dental Injury: Teeth and Oropharynx as per pre-operative assessment

## 2013-01-05 NOTE — Transfer of Care (Signed)
Immediate Anesthesia Transfer of Care Note  Patient: Mark Bean  Procedure(s) Performed: Procedure(s) with comments: HARDWARE REMOVAL (Right) - Right Foot Debridement, Place antibiotic beads, remove screw  Patient Location: PACU  Anesthesia Type:General  Level of Consciousness: awake, alert  and oriented  Airway & Oxygen Therapy: Patient Spontanous Breathing and Patient connected to nasal cannula oxygen  Post-op Assessment: Report given to PACU RN, Post -op Vital signs reviewed and stable and Patient moving all extremities X 4  Post vital signs: Reviewed and stable  Complications: No apparent anesthesia complications

## 2013-01-05 NOTE — H&P (Signed)
Mark Bean is an 67 y.o. male.   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  Past Medical History  Diagnosis Date  . Myocardial infarction   . Diabetes mellitus without complication   . Arthritis   . Anxiety   . Hypertension     dr Mark Bean    [id number]  . Kidney stones     Past Surgical History  Procedure Laterality Date  . Cardiac catheterization      2000  . Coronary artery bypass graft      2000   1 stent  . Amputation Right 10/11/2012    Procedure: 2nd ray amputation, medial column fusion and ostectomy right foot;  Surgeon: Mark Mustard, MD;  Location: [facility] OR;  Service: Orthopedics;  Laterality: Right;  . Ankle fusion Right 10/11/2012    Procedure: ARTHRODESIS ANKLE;  Surgeon: Mark Mustard, MD;  Location: [facility] OR;  Service: Orthopedics;  Laterality: Right;  . Eye surgery Right     cataract   . Joint replacement Left     knee  . Colonoscopy    . Vasectomy    . I&d extremity Right 11/10/2012    Procedure: IRRIGATION AND DEBRIDEMENT EXTREMITY;  Surgeon: Mark Mustard, MD;  Location: [facility] OR;  Service: Orthopedics;  Laterality: Right;  Irrigation and Debridement Right Foot Wound,  Antibiotic Beads and Wound VAC    History reviewed. No pertinent family history. Social History:  reports that he has never smoked. He has never used smokeless tobacco. He reports that he drinks alcohol. He reports that he does not use illicit drugs.  Allergies: No Known Allergies  No prescriptions prior to admission    No results found for this or any previous visit (from the past 48 hour(s)). No results found.  Review of Systems  All other systems reviewed and are negative.    There were no vitals taken  for this visit. Physical Exam  On examination patient has a palpable dorsalis pedis pulse he has no plantar ulcers medially he has an ulcer over the surgical incision which is 10 x 20 mm there is exposed bone at the base of the wound. Assessment/Plan Assessment: Open wound right foot with exposed bone status post reconstruction for Charcot collapse. Plan will plan for debridement of the bone removal of deep retained hardware. Risks and benefits were discussed including persistent infection nonhealing of the wound need for additional surgery. Patient states he understands and wished to proceed at this time.  Mark Bean V 01/05/2013, 6:33 AM

## 2013-01-05 NOTE — Anesthesia Preprocedure Evaluation (Signed)
Anesthesia Evaluation  Patient identified by MRN, date of birth, ID band Patient awake    Reviewed: Allergy & Precautions, H&P , NPO status , Patient's Chart, lab work & pertinent test results, reviewed documented beta blocker date and time   Airway Mallampati: II TM Distance: >3 FB Neck ROM: full    Dental   Pulmonary neg pulmonary ROS,  breath sounds clear to auscultation        Cardiovascular hypertension, On Medications and On Home Beta Blockers + Past MI, + Cardiac Stents and + Peripheral Vascular Disease Rhythm:regular     Neuro/Psych negative neurological ROS  negative psych ROS   GI/Hepatic negative GI ROS, Neg liver ROS,   Endo/Other  negative endocrine ROSdiabetes, Insulin Dependent and Oral Hypoglycemic Agents  Renal/GU Renal disease  negative genitourinary   Musculoskeletal   Abdominal   Peds  Hematology negative hematology ROS (+)   Anesthesia Other Findings See surgeon's H&P   Reproductive/Obstetrics negative OB ROS                           Anesthesia Physical Anesthesia Plan  ASA: III  Anesthesia Plan: General   Post-op Pain Management:    Induction: Intravenous  Airway Management Planned: LMA  Additional Equipment:   Intra-op Plan:   Post-operative Plan:   Informed Consent: I have reviewed the patients History and Physical, chart, labs and discussed the procedure including the risks, benefits and alternatives for the proposed anesthesia with the patient or authorized representative who has indicated his/her understanding and acceptance.   Dental Advisory Given  Plan Discussed with: CRNA and Surgeon  Anesthesia Plan Comments:         Anesthesia Quick Evaluation

## 2013-01-05 NOTE — Preoperative (Signed)
Beta Blockers   Reason not to administer Beta Blockers:Not Applicable 

## 2013-01-05 NOTE — Op Note (Signed)
OPERATIVE REPORT  DATE OF SURGERY: 01/05/2013  PATIENT:  Mark Bean,  67 y.o. male  PRE-OPERATIVE DIAGNOSIS:  Osteomyelitis Right Foot and Ulcer  POST-OPERATIVE DIAGNOSIS:  Osteomyelitis Right Foot and Ulcer  PROCEDURE:  Procedure(s): HARDWARE REMOVAL Excision bone first metatarsal medial cuneiform navicular and talus. Application of antibiotic beads with stimulant vancomycin 500 mg of gentamicin 240 mg. Local tissue rearrangement for wound closure 4 x 8 cm.  SURGEON:  Surgeon(s): [name], MD  ANESTHESIA:   general  EBL:  min ML  SPECIMEN:  No Specimen  TOURNIQUET:  * No tourniquets in log *  PROCEDURE DETAILS: Patient is a 66 year old gentleman status post internal fixation for Charcot collapse. Patient has developed ulceration with exposed bone and presents at this time for surgical intervention. Risk and benefits were discussed including persistent infection. Nonhealing wound. Patient states he understands was pursued this time. Description of procedure patient brought to the operating room and underwent a general anesthetic. After adequate levels and anesthesia obtained patient's right lower extremity was prepped using DuraPrep and draped into a sterile field. An incision was made plantarly on the great toe a guidewire was inserted into the intramedullary screw and intramedullary screw was removed. The bone from the open wound the open wound was excised back to wound of a 4 x 8 cm back to bleeding viable edges. The local tissue was undermined for local tissue rearrangement. Bone was excised back to bleeding viable healthy bone of the first metatarsal medial cuneiform navicular and talus. This was irrigated with normal saline there was good healthy bleeding. The local tissue was rearranged in the wound was closed with 2-0 nylon. The wound was covered with Adaptic orthopedic sponges AB dressing Kerlix and Coban. Patient was extubated taken to the PACU in stable  condition.  PLAN OF CARE: Discharge to home after PACU  PATIENT DISPOSITION:  PACU - hemodynamically stable.   [name], MD 01/05/2013 6:44 PM

## 2013-01-09 ENCOUNTER — Encounter (HOSPITAL_COMMUNITY): Payer: Self-pay | Admitting: Orthopedic Surgery

## 2013-06-08 ENCOUNTER — Inpatient Hospital Stay: Payer: Self-pay | Admitting: Internal Medicine

## 2013-06-08 LAB — COMPREHENSIVE METABOLIC PANEL
ANION GAP: 11 (ref 7–16)
AST: 94 U/L — AB (ref 15–37)
Albumin: 3.9 g/dL (ref 3.4–5.0)
Alkaline Phosphatase: 76 U/L
BUN: 24 mg/dL — ABNORMAL HIGH (ref 7–18)
Bilirubin,Total: 0.4 mg/dL (ref 0.2–1.0)
Calcium, Total: 9.2 mg/dL (ref 8.5–10.1)
Chloride: 102 mmol/L (ref 98–107)
Co2: 25 mmol/L (ref 21–32)
Creatinine: 1.03 mg/dL (ref 0.60–1.30)
EGFR (Non-African Amer.): >60
Glucose: 209 mg/dL — ABNORMAL HIGH (ref 65–99)
OSMOLALITY: 286 (ref 275–301)
Potassium: 3.5 mmol/L (ref 3.5–5.1)
SGPT (ALT): 62 U/L (ref 12–78)
SODIUM: 138 mmol/L (ref 136–145)
TOTAL PROTEIN: 7 g/dL (ref 6.4–8.2)

## 2013-06-08 LAB — URINALYSIS, COMPLETE
BILIRUBIN, UR: NEGATIVE
BLOOD: NEGATIVE
Bacteria: NONE SEEN
Glucose,UR: NEGATIVE mg/dL (ref 0–75)
KETONE: NEGATIVE
LEUKOCYTE ESTERASE: NEGATIVE
NITRITE: NEGATIVE
PH: 5 (ref 4.5–8.0)
Protein: NEGATIVE
RBC,UR: <1 /HPF (ref 0–5)
SPECIFIC GRAVITY: 1.016 (ref 1.003–1.030)
WBC UR: NONE SEEN /HPF (ref 0–5)

## 2013-06-08 LAB — CBC
HCT: 38 % — AB (ref 40.0–52.0)
HGB: 12.7 g/dL — ABNORMAL LOW (ref 13.0–18.0)
MCH: 27.7 pg (ref 26.0–34.0)
MCHC: 33.5 g/dL (ref 32.0–36.0)
MCV: 83 fL (ref 80–100)
PLATELETS: 174 10*3/uL (ref 150–440)
RBC: 4.61 10*6/uL (ref 4.40–5.90)
RDW: 14.8 % — AB (ref 11.5–14.5)
WBC: 5.6 10*3/uL (ref 3.8–10.6)

## 2013-06-08 LAB — LIPASE, BLOOD: LIPASE: 9371 U/L — AB (ref 73–393)

## 2013-06-08 LAB — TROPONIN I

## 2013-06-09 LAB — COMPREHENSIVE METABOLIC PANEL
ALT: 125 U/L — AB (ref 12–78)
ANION GAP: 3 — AB (ref 7–16)
Albumin: 3.5 g/dL (ref 3.4–5.0)
Alkaline Phosphatase: 72 U/L
BUN: 16 mg/dL (ref 7–18)
Bilirubin,Total: 0.4 mg/dL (ref 0.2–1.0)
CHLORIDE: 108 mmol/L — AB (ref 98–107)
Calcium, Total: 8.5 mg/dL (ref 8.5–10.1)
Co2: 31 mmol/L (ref 21–32)
Creatinine: 0.98 mg/dL (ref 0.60–1.30)
Glucose: 122 mg/dL — ABNORMAL HIGH (ref 65–99)
Osmolality: 286 (ref 275–301)
Potassium: 3.4 mmol/L — ABNORMAL LOW (ref 3.5–5.1)
SGOT(AST): 110 U/L — ABNORMAL HIGH (ref 15–37)
SODIUM: 142 mmol/L (ref 136–145)
TOTAL PROTEIN: 6.4 g/dL (ref 6.4–8.2)

## 2013-06-09 LAB — CBC WITH DIFFERENTIAL/PLATELET
Basophil #: 0 10*3/uL (ref 0.0–0.1)
Basophil %: 0.7 %
EOS ABS: 0.1 10*3/uL (ref 0.0–0.7)
EOS PCT: 3.2 %
HCT: 36 % — AB (ref 40.0–52.0)
HGB: 11.9 g/dL — ABNORMAL LOW (ref 13.0–18.0)
LYMPHS ABS: 1.1 10*3/uL (ref 1.0–3.6)
Lymphocyte %: 27.9 %
MCH: 27.2 pg (ref 26.0–34.0)
MCHC: 33 g/dL (ref 32.0–36.0)
MCV: 82 fL (ref 80–100)
MONOS PCT: 6.8 %
Monocyte #: 0.3 x10 3/mm (ref 0.2–1.0)
NEUTROS ABS: 2.5 10*3/uL (ref 1.4–6.5)
NEUTROS PCT: 61.4 %
Platelet: 161 10*3/uL (ref 150–440)
RBC: 4.37 10*6/uL — AB (ref 4.40–5.90)
RDW: 14.3 % (ref 11.5–14.5)
WBC: 4 10*3/uL (ref 3.8–10.6)

## 2013-06-09 LAB — LIPID PANEL
Cholesterol: 99 mg/dL (ref 0–200)
HDL Cholesterol: 20 mg/dL — ABNORMAL LOW (ref 40–60)
LDL CHOLESTEROL, CALC: 29 mg/dL (ref 0–100)
Triglycerides: 251 mg/dL — ABNORMAL HIGH (ref 0–200)
VLDL Cholesterol, Calc: 50 mg/dL — ABNORMAL HIGH (ref 5–40)

## 2013-06-09 LAB — LIPASE, BLOOD: LIPASE: 425 U/L — AB (ref 73–393)

## 2013-06-14 ENCOUNTER — Other Ambulatory Visit (HOSPITAL_COMMUNITY): Payer: Self-pay | Admitting: Orthopedic Surgery

## 2013-06-14 ENCOUNTER — Encounter (HOSPITAL_COMMUNITY): Payer: Self-pay | Admitting: *Deleted

## 2013-06-14 NOTE — Progress Notes (Addendum)
Pt denies SOB and chest pain. Pt under the care of Dr. Gwen Pounds ( cardiology). Pt stated that he recently had an EKG done at [facility]; results requested. Pt stated that an echo and stress test were done at the [facility] in [place].

## 2013-06-15 ENCOUNTER — Encounter (HOSPITAL_COMMUNITY)
Admission: RE | Disposition: A | Discharge status: Home / Self Care | Payer: Self-pay | Source: Ambulatory Visit | Attending: Orthopedic Surgery

## 2013-06-15 ENCOUNTER — Encounter (HOSPITAL_COMMUNITY): Payer: Medicare Other | Admitting: Certified Registered"

## 2013-06-15 ENCOUNTER — Ambulatory Visit (HOSPITAL_COMMUNITY)
Admission: RE | Admit: 2013-06-15 | Discharge: 2013-06-15 | Disposition: A | Discharge status: Home / Self Care | Payer: Medicare Other | Source: Ambulatory Visit | Attending: Orthopedic Surgery | Admitting: Orthopedic Surgery

## 2013-06-15 ENCOUNTER — Ambulatory Visit (HOSPITAL_COMMUNITY): Payer: Medicare Other | Admitting: Certified Registered"

## 2013-06-15 ENCOUNTER — Encounter (HOSPITAL_COMMUNITY): Payer: Self-pay | Admitting: *Deleted

## 2013-06-15 DIAGNOSIS — Z951 Presence of aortocoronary bypass graft: Secondary | ICD-10-CM | POA: Insufficient documentation

## 2013-06-15 DIAGNOSIS — E1142 Type 2 diabetes mellitus with diabetic polyneuropathy: Secondary | ICD-10-CM | POA: Insufficient documentation

## 2013-06-15 DIAGNOSIS — E1169 Type 2 diabetes mellitus with other specified complication: Secondary | ICD-10-CM | POA: Insufficient documentation

## 2013-06-15 DIAGNOSIS — Z96659 Presence of unspecified artificial knee joint: Secondary | ICD-10-CM | POA: Insufficient documentation

## 2013-06-15 DIAGNOSIS — E1149 Type 2 diabetes mellitus with other diabetic neurological complication: Secondary | ICD-10-CM | POA: Insufficient documentation

## 2013-06-15 DIAGNOSIS — M9080 Osteopathy in diseases classified elsewhere, unspecified site: Secondary | ICD-10-CM | POA: Insufficient documentation

## 2013-06-15 DIAGNOSIS — I10 Essential (primary) hypertension: Secondary | ICD-10-CM | POA: Insufficient documentation

## 2013-06-15 DIAGNOSIS — M869 Osteomyelitis, unspecified: Secondary | ICD-10-CM

## 2013-06-15 DIAGNOSIS — I252 Old myocardial infarction: Secondary | ICD-10-CM | POA: Insufficient documentation

## 2013-06-15 DIAGNOSIS — L97809 Non-pressure chronic ulcer of other part of unspecified lower leg with unspecified severity: Secondary | ICD-10-CM | POA: Insufficient documentation

## 2013-06-15 HISTORY — PX: AMPUTATION: SHX166

## 2013-06-15 HISTORY — DX: Acute pancreatitis without necrosis or infection, unspecified: K85.90

## 2013-06-15 LAB — PROTIME-INR
INR: 1.11 (ref 0.00–1.49)
Prothrombin Time: 14.1 seconds (ref 11.6–15.2)

## 2013-06-15 LAB — GLUCOSE, CAPILLARY
GLUCOSE-CAPILLARY: 253 mg/dL — AB (ref 70–99)
GLUCOSE-CAPILLARY: 207 mg/dL — AB (ref 70–99)

## 2013-06-15 LAB — APTT: APTT: 29 s (ref 24–37)

## 2013-06-15 SURGERY — AMPUTATION DIGIT
Anesthesia: General | Site: Foot | Laterality: Right

## 2013-06-15 MED ORDER — CEFAZOLIN SODIUM-DEXTROSE 2-3 GM-% IV SOLR
2.0000 g | INTRAVENOUS | Status: AC
  Administered 2013-06-15: 2 g via INTRAVENOUS

## 2013-06-15 MED ORDER — ONDANSETRON HCL 4 MG/2ML IJ SOLN: 4.0000 mg | Freq: Once | INTRAMUSCULAR | Status: DC | PRN

## 2013-06-15 MED ORDER — OXYCODONE HCL 5 MG PO TABS: 5.0000 mg | ORAL_TABLET | Freq: Once | ORAL | Status: DC | PRN

## 2013-06-15 MED ORDER — PROPOFOL 10 MG/ML IV BOLUS
INTRAVENOUS | Status: AC
  Filled 2013-06-15: qty 20

## 2013-06-15 MED ORDER — OXYCODONE HCL 5 MG/5ML PO SOLN: 5.0000 mg | Freq: Once | ORAL | Status: DC | PRN

## 2013-06-15 MED ORDER — LIDOCAINE HCL (CARDIAC) 20 MG/ML IV SOLN
INTRAVENOUS | Status: DC | PRN
  Administered 2013-06-15: 60 mg via INTRAVENOUS

## 2013-06-15 MED ORDER — FENTANYL CITRATE 0.05 MG/ML IJ SOLN
INTRAMUSCULAR | Status: DC | PRN
  Administered 2013-06-15: 50 ug via INTRAVENOUS

## 2013-06-15 MED ORDER — OXYCODONE-ACETAMINOPHEN 5-325 MG PO TABS: 1.0000 | ORAL_TABLET | ORAL | Status: DC | PRN

## 2013-06-15 MED ORDER — HYDROMORPHONE HCL PF 1 MG/ML IJ SOLN: 0.2500 mg | INTRAMUSCULAR | Status: DC | PRN

## 2013-06-15 MED ORDER — PROPOFOL 10 MG/ML IV BOLUS
INTRAVENOUS | Status: DC | PRN
  Administered 2013-06-15: 170 mg via INTRAVENOUS

## 2013-06-15 MED ORDER — LACTATED RINGERS IV SOLN
INTRAVENOUS | Status: DC
  Administered 2013-06-15: 50 mL/h via INTRAVENOUS

## 2013-06-15 MED ORDER — 0.9 % SODIUM CHLORIDE (POUR BTL) OPTIME
TOPICAL | Status: DC | PRN
  Administered 2013-06-15: 1000 mL

## 2013-06-15 MED ORDER — MIDAZOLAM HCL 2 MG/2ML IJ SOLN
INTRAMUSCULAR | Status: AC
  Filled 2013-06-15: qty 2

## 2013-06-15 MED ORDER — ONDANSETRON HCL 4 MG/2ML IJ SOLN
INTRAMUSCULAR | Status: AC
  Filled 2013-06-15: qty 2

## 2013-06-15 MED ORDER — LIDOCAINE HCL (CARDIAC) 20 MG/ML IV SOLN
INTRAVENOUS | Status: AC
  Filled 2013-06-15: qty 5

## 2013-06-15 MED ORDER — ONDANSETRON HCL 4 MG/2ML IJ SOLN
INTRAMUSCULAR | Status: DC | PRN
  Administered 2013-06-15: 4 mg via INTRAVENOUS

## 2013-06-15 MED ORDER — CEFAZOLIN SODIUM-DEXTROSE 2-3 GM-% IV SOLR
INTRAVENOUS | Status: AC
  Filled 2013-06-15: qty 50

## 2013-06-15 MED ORDER — FENTANYL CITRATE 0.05 MG/ML IJ SOLN
INTRAMUSCULAR | Status: AC
  Filled 2013-06-15: qty 5

## 2013-06-15 MED ORDER — MIDAZOLAM HCL 5 MG/5ML IJ SOLN
INTRAMUSCULAR | Status: DC | PRN
  Administered 2013-06-15: 1 mg via INTRAVENOUS

## 2013-06-15 SURGICAL SUPPLY — 31 items
BANDAGE GAUZE ELAST BULKY 4 IN (GAUZE/BANDAGES/DRESSINGS) ×1 IMPLANT
BNDG CMPR 9X4 STRL LF SNTH (GAUZE/BANDAGES/DRESSINGS)
BNDG COHESIVE 4X5 TAN STRL (GAUZE/BANDAGES/DRESSINGS) ×2 IMPLANT
BNDG ESMARK 4X9 LF (GAUZE/BANDAGES/DRESSINGS) IMPLANT
BNDG GAUZE ELAST 4 BULKY (GAUZE/BANDAGES/DRESSINGS) ×1 IMPLANT
COVER SURGICAL LIGHT HANDLE (MISCELLANEOUS) ×2 IMPLANT
DRAPE U-SHAPE 47X51 STRL (DRAPES) ×2 IMPLANT
DRSG ADAPTIC 3X8 NADH LF (GAUZE/BANDAGES/DRESSINGS) ×1 IMPLANT
DRSG PAD ABDOMINAL 8X10 ST (GAUZE/BANDAGES/DRESSINGS) ×2 IMPLANT
DURAPREP 26ML APPLICATOR (WOUND CARE) ×2 IMPLANT
ELECT REM PT RETURN 9FT ADLT (ELECTROSURGICAL) ×2
ELECTRODE REM PT RTRN 9FT ADLT (ELECTROSURGICAL) ×1 IMPLANT
GLOVE BIOGEL PI IND STRL 9 (GLOVE) ×1 IMPLANT
GLOVE BIOGEL PI INDICATOR 9 (GLOVE) ×1
GLOVE SURG ORTHO 9.0 STRL STRW (GLOVE) ×2 IMPLANT
GOWN STRL REUS W/ TWL XL LVL3 (GOWN DISPOSABLE) ×2 IMPLANT
GOWN STRL REUS W/TWL XL LVL3 (GOWN DISPOSABLE) ×6
KIT BASIN OR (CUSTOM PROCEDURE TRAY) ×2 IMPLANT
KIT ROOM TURNOVER OR (KITS) ×2 IMPLANT
MANIFOLD NEPTUNE II (INSTRUMENTS) ×2 IMPLANT
NEEDLE 22X1 1/2 (OR ONLY) (NEEDLE) IMPLANT
NS IRRIG 1000ML POUR BTL (IV SOLUTION) ×2 IMPLANT
PACK ORTHO EXTREMITY (CUSTOM PROCEDURE TRAY) ×2 IMPLANT
PAD ABD 8X10 STRL (GAUZE/BANDAGES/DRESSINGS) ×1 IMPLANT
PAD ARMBOARD 7.5X6 YLW CONV (MISCELLANEOUS) ×4 IMPLANT
SPONGE GAUZE 4X4 12PLY (GAUZE/BANDAGES/DRESSINGS) ×1 IMPLANT
SUCTION FRAZIER TIP 10 FR DISP (SUCTIONS) IMPLANT
SUT ETHILON 2 0 PSLX (SUTURE) ×2 IMPLANT
SYR CONTROL 10ML LL (SYRINGE) IMPLANT
TOWEL OR 17X24 6PK STRL BLUE (TOWEL DISPOSABLE) ×2 IMPLANT
TOWEL OR 17X26 10 PK STRL BLUE (TOWEL DISPOSABLE) ×1 IMPLANT

## 2013-06-15 NOTE — Anesthesia Postprocedure Evaluation (Signed)
  Anesthesia Post-op Note  Patient: Mark Bean  Procedure(s) Performed: Procedure(s) with comments: AMPUTATION DIGIT (Right) - Right 3rd Toe Amputation  Patient Location: PACU  Anesthesia Type:General  Level of Consciousness: awake, alert  and oriented  Airway and Oxygen Therapy: Patient Spontanous Breathing and Patient connected to nasal cannula oxygen  Post-op Pain: mild  Post-op Assessment: Post-op Vital signs reviewed, Patient's Cardiovascular Status Stable, Respiratory Function Stable, Patent Airway, No signs of Nausea or vomiting and Pain level controlled  Post-op Vital Signs: stable  Last Vitals:  Filed Vitals:   06/15/13 1508  BP: 138/74  Pulse:   Temp:   Resp: 16    Complications: No apparent anesthesia complications

## 2013-06-15 NOTE — Anesthesia Procedure Notes (Signed)
Procedure Name: LMA Insertion Date/Time: 06/15/2013 1:54 PM Performed by: Jerilee Hoh Pre-anesthesia Checklist: Patient identified, Emergency Drugs available, Suction available and Patient being monitored Patient Re-evaluated:Patient Re-evaluated prior to inductionOxygen Delivery Method: Circle system utilized Preoxygenation: Pre-oxygenation with 100% oxygen Intubation Type: IV induction LMA: LMA inserted LMA Size: 5.0 Tube type: Oral Number of attempts: 1 Placement Confirmation: positive ETCO2 and breath sounds checked- equal and bilateral Tube secured with: Tape Dental Injury: Teeth and Oropharynx as per pre-operative assessment

## 2013-06-15 NOTE — Progress Notes (Signed)
Orthopedic Tech Progress Note Patient Details:  WILBERN THAI 06-08-1945 [id number] Post op shoe delivered to patient in PACU. Patient was having trouble with heavy bleeding from surgical site and nursing was trying to control. Post op shoe to be applied once bleeding is under control. Ortho Devices Type of Ortho Device: Postop shoe/boot Ortho Device/Splint Interventions: Ordered   [place] R Thompson 06/15/2013, 4:02 PM

## 2013-06-15 NOTE — Op Note (Signed)
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    The risks benefits and alternatives were discussed with the patient preoperatively including but not limited to the risks of infection, bleeding, nerve injury, cardiopulmonary complications, the need for revision surgery, among others, and the patient was willing to proceed.  OPERATIVE IMPLANTS: None  OPERATIVE FINDINGS: No abscess at operative site  OPERATIVE PROCEDURE: Patient was brought to the operating room and underwent a general anesthetic. After adequate levels of anesthesia were obtained patient's right lower extremity was prepped using DuraPrep draped into a sterile field. A racquet incision was made around the right foot third toe. The toe was amputated through the MTP joint. Hemostasis was obtained. There was good petechial bleeding. The wound was irrigated and the incision closed using 2-0 nylon. The wound was covered with compressive dressing. Patient was extubated taken to the PACU in stable condition. Plan for discharge to home.

## 2013-06-15 NOTE — Anesthesia Preprocedure Evaluation (Signed)
Anesthesia Evaluation  Patient identified by MRN, date of birth, ID band Patient awake    Reviewed: Allergy & Precautions, H&P , NPO status , Patient's Chart, lab work & pertinent test results  Airway Mallampati: II TM Distance: >3 FB Neck ROM: Full    Dental  (+) Teeth Intact, Dental Advisory Given   Pulmonary  breath sounds clear to auscultation        Cardiovascular hypertension, Rhythm:Regular Rate:Normal     Neuro/Psych    GI/Hepatic   Endo/Other  diabetes  Renal/GU      Musculoskeletal   Abdominal   Peds  Hematology   Anesthesia Other Findings   Reproductive/Obstetrics                           Anesthesia Physical Anesthesia Plan  ASA: III  Anesthesia Plan: General   Post-op Pain Management:    Induction: Intravenous  Airway Management Planned: LMA  Additional Equipment:   Intra-op Plan:   Post-operative Plan:   Informed Consent: I have reviewed the patients History and Physical, chart, labs and discussed the procedure including the risks, benefits and alternatives for the proposed anesthesia with the patient or authorized representative who has indicated his/her understanding and acceptance.   Dental advisory given  Plan Discussed with: CRNA and Anesthesiologist  Anesthesia Plan Comments:         Anesthesia Quick Evaluation

## 2013-06-15 NOTE — H&P (Signed)
Mark Bean is an 68 y.o. male.   Chief Complaint: Osteomyelitis ulceration right foot third toe HPI: Patient is a 68 year old gentleman status post reconstruction for Charcot collapse of the right foot as well as amputation of the second toe. Patient presents at this time with failure of conservative treatment with osteomyelitis and ulceration of the third toe.  Past Medical History  Diagnosis Date  . Myocardial infarction   . Diabetes mellitus without complication   . Arthritis   . Anxiety   . Hypertension     dr Shonna Chock    [id number]  . Kidney stones   . Pancreatitis, acute     Past Surgical History  Procedure Laterality Date  . Cardiac catheterization      2000  . Coronary artery bypass graft      2000   1 stent  . Amputation Right 10/11/2012    Procedure: 2nd ray amputation, medial column fusion and ostectomy right foot;  Surgeon: [name], MD;  Location: [facility] OR;  Service: Orthopedics;  Laterality: Right;  . Ankle fusion Right 10/11/2012    Procedure: ARTHRODESIS ANKLE;  Surgeon: [name], MD;  Location: [facility] OR;  Service: Orthopedics;  Laterality: Right;  . Eye surgery Right     cataract   . Joint replacement Left     knee  . Colonoscopy    . Vasectomy    . I&d extremity Right 11/10/2012    Procedure: IRRIGATION AND DEBRIDEMENT EXTREMITY;  Surgeon: [name], MD;  Location: [facility] OR;  Service: Orthopedics;  Laterality: Right;  Irrigation and Debridement Right Foot Wound,  Antibiotic Beads and Wound VAC  . Hardware removal Right 01/05/2013    Procedure: HARDWARE REMOVAL;  Surgeon: [name], MD;  Location: [facility] OR;  Service: Orthopedics;  Laterality: Right;  Right Foot Debridement, Placement antibiotic beads, remove screw  . Tonsillectomy    . Stents      History reviewed. No pertinent family history. Social History:  reports that he has never smoked. He has never used smokeless tobacco. He reports that he drinks alcohol. He reports that he does not use illicit  drugs.  Allergies: No Known Allergies  No prescriptions prior to admission    No results found for this or any previous visit (from the past 48 hour(s)). No results found.  Review of Systems  All other systems reviewed and are negative.   There were no vitals taken for this visit. Physical Exam  On examination patient has a red swollen third toe right foot. There is a plantar ulcer which probes to bone and radiographs show destructive changes consistent with osteomyelitis. 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  Plan: We'll plan for amputation of the third toe. Risks and benefits were discussed including infection neurovascular injury nonhealing of the wound need for additional surgery. Patient states he understands and wished to proceed at this time.  Aldean Baker V 06/15/2013, 6:26 AM

## 2013-06-15 NOTE — Transfer of Care (Signed)
Immediate Anesthesia Transfer of Care Note  Patient: Mark Bean  Procedure(s) Performed: Procedure(s) with comments: AMPUTATION DIGIT (Right) - Right 3rd Toe Amputation  Patient Location: PACU  Anesthesia Type:General  Level of Consciousness: awake, alert , oriented and patient cooperative  Airway & Oxygen Therapy: Patient Spontanous Breathing and Patient connected to nasal cannula oxygen  Post-op Assessment: Report given to PACU RN, Post -op Vital signs reviewed and stable and Patient moving all extremities  Post vital signs: Reviewed and stable  Complications: No apparent anesthesia complications

## 2013-06-18 ENCOUNTER — Encounter (HOSPITAL_COMMUNITY): Payer: Self-pay | Admitting: Orthopedic Surgery

## 2014-05-10 NOTE — Consult Note (Signed)
PATIENT NAME:  Mark Bean, [name] MR#:  [id number] DATE OF BIRTH:  16-Dec-1945  DATE OF CONSULTATION:  02/16/2012  REFERRING PHYSICIAN:  Clare Gandyhristopher E. Smith, MD CONSULTING PHYSICIAN:  Shaune PollackQing Francyne Arreaga, MD  REASON FOR CONSULTATION: Medical management.  HISTORY OF PRESENT ILLNESS: The patient is a 69 year old Caucasian male with a history of hypertension, diabetes, CAD, status post CABG, and osteoarthritis. He was admitted for left knee replacement. He had surgery today. Since the patient's blood sugar is not controlled, Dr. Katrink[facility]Smith [facility]. The patient was examined bedside. He is status post surgery and drainage from knee. The patient has no complaints. He denies any fever or chills. No headache or dizziness. No weakness. No chest pain, palpitations, orthopnea, nocturnal dyspnea, but the patient complains of right second toe redness and mild tenderness for the past 2 days.   PAST MEDICAL HISTORY: Hypertension, diabetes, CAD.   PAST SURGICAL HISTORY: CABG and left total knee replacement today.   FAMILY HISTORY: Both parents had cancer but no heart attack, stroke, hypertension or diabetes.   SOCIAL HISTORY: No smoking or drinking or illicit drugs.   HOME MEDICATIONS:  1.  Aspirin 81 mg p.o. at bedtime. 2.  Coreg 12.5 mg p.o. b.i.d.  3.  Chlorthalidone 50 mg p.o. daily. 4.  Cinnamon 500 mg capsule to 2 caps once at bedtime.  5.  Citalopram 20 mg p.o. daily.  6.  Fish oil 1000 mg 1 tab once a day. 7.  Lantus 40 units subcutaneous q.12 hours.  8.  Meloxicam 15 mg p.o. once a day. 9.  Metformin 1000 mg p.o. b.i.d. 10.  Multivitamin once a day. 11.  NovoLog 20 units subcutaneous t.i.d. before meals.  12.  Potassium gluconate 550 mg p.o. at bedtime.  13.  Zocor 40 mg p.o. at bedtime.   REVIEW OF SYSTEMS:  CONSTITUTIONAL: The patient denies any fever or chills. No headache or dizziness. No weakness.  EYES: No double vision or blurred vision, but has a  cataract.  ENT: No postnasal drip, slurred speech or dysphagia.  CARDIOVASCULAR: No chest pain, palpitations, orthopnea or nocturnal dyspnea. No leg edema.  PULMONARY: No cough, sputum, shortness of breath or hemoptysis.  GASTROINTESTINAL: No abdominal pain, nausea, vomiting or diarrhea. No melena or bloody stool.  GENITOURINARY: No dysuria, hematuria or incontinence.  SKIN: No rash or jaundice.  NEUROLOGIC: No syncope, loss of consciousness or seizure.   PHYSICAL EXAMINATION:  VITAL SIGNS: Temperature 98.9, blood pressure 157/83, pulse 99, O2 saturation 91% on room air.  GENERAL: The patient is alert, awake, oriented, in no acute distress.  HEENT: Pupils round, equal, reactive to light and accommodation. Moist oral mucosa. Clear oropharynx.  NECK: Supple. No JVD or carotid bruits. No lymphadenopathy. No thyromegaly.  CARDIOVASCULAR: S1, S2 regular rate and rhythm. No murmurs or gallops.  PULMONARY: Bilateral air entry. No wheezing or rales. No of accessory muscles to breathe.  ABDOMEN: Soft, obese. Bowel sounds present. No organomegaly. No tenderness or distention.  EXTREMITIES: Left side in dressing with bloody drainage. Right side: No edema, clubbing or cyanosis, but has erythema and mild tenderness on the second toe. Pedal pulses present.  SKIN: No rash or jaundice.  NEUROLOGIC: Alert and oriented x 3. No focal deficit. Sensation intact.  LABORATORY DATA: BMP and CBC are pending. The last blood sugar was 269 but according to the patient, early this morning the patient's blood sugar was about 400.   IMPRESSION:  1.  Uncontrolled diabetes.  2.  Right second toe mild cellulitis.  3.  Hypertension.  4.  Coronary artery disease.  5.  Osteoarthritis, status post left total knee replacement.   RECOMMENDATIONS:  1.  Currently the patient is taking Lantus 30 units b.i.d. with NovoLog 20 units before meals. We will increase the Lantus to 40 units b.i.d., which is the patient's home dose,  with NovoLog 20 units subcutaneous before meals. We will continue Accu-Cheks, sliding scale, and adjust dose depending on patient's blood sugar.  2.  For hypertension, we will continue Coreg.  3.  The patient needs pain control after surgery.  4.  Continue Ancef now and may change to p.o. antibiotics for right second toe cellulitis.  5.  For CAD, agree with continuing aspirin and Zocor.  6.  Follow up BMP, CBC.  7.  GI and DVT prophylaxis.  8.  I discussed the patient's situation and recommendations with Dr. Katrinka Blazing.   TIME SPENT: About 58 minutes.   ____________________________ Shaune Pollack, MD qc:jm D: 02/16/2012 18:02:37 ET T: 02/16/2012 19:33:14 ET JOB#: [id number]  cc: Shaune Pollack, MD, <Dictator> Shaune Pollack MD ELECTRONICALLY SIGNED 02/16/2012 21:11

## 2014-05-10 NOTE — Discharge Summary (Signed)
PATIENT NAME:  Mark Bean, [name] MR#:  [id number] DATE OF BIRTH:  [birth year]  DATE OF ADMISSION:  02/16/2012 DATE OF DISCHARGE:  02/19/2012  DISCHARGE DIAGNOSES:   1.  Severe degenerative arthritis, left knee.  2.  Previous myocardial infarction.  3.  Coronary artery bypass procedure in 1999.  4.  Diabetes mellitus.  5.  Hypertension.  6.  Hyperlipidemia.   OPERATIONS/PROCEDURES PERFORMED:  Left total knee arthroplasty on 02/16/2012.   HISTORY AND PHYSICAL EXAMINATION:  As written on admission.   LABORATORY DATA:  As noted in the chart.   HOSPITAL COURSE:  The patient previously obtained medical clearance and these notes are as included within the record. The patient was taken to the operating room on 02/16/2012 where left total knee arthroplasty was performed without difficulty. The patient tolerated the procedure extremely well and had a completely benign postoperative course. On day #1, his Hemovac was removed. He was advanced up into the chair and physical therapy was begun for range of motion of the knee. While in the hospital as prophylaxis for venous thrombosis and pulmonary embolism, he was treated with Lovenox. He progressed in physical therapy such that on 02/19/2011, it was felt that he could be safely discharged home. His medications are basically his preoperative medications taken at home. He is advised to take aspirin 325 mg, enteric-coated, twice a day with food as prophylaxis for venous thrombosis and pulmonary embolism. Home health physical therapy is arranged for the usual postoperative total knee protocol. He is to return to the office in 7 to 10 days for staple removal, x-ray and examination.    ____________________________ Clare Gandyhristopher E. Jimena Wieczorek, MD ces:si D: 03/05/2012 11:20:24 ET T: 03/05/2012 18:25:18 ET JOB#: [id number]  cc: Clare Gandyhristopher E. Ragnar Waas, MD, <Dictator> Clare GandyHRISTOPHER E Tane Biegler MD ELECTRONICALLY SIGNED 03/06/2012 8:32

## 2014-05-10 NOTE — Op Note (Signed)
PATIENT NAME:  Mark Bean, Mark Bean MR#:  [id number] DATE OF BIRTH:  [birth year]  DATE OF PROCEDURE:  02/16/2012  PREOPERATIVE DIAGNOSIS: Severe degenerative arthritis, left knee.   POSTOPERATIVE DIAGNOSIS: Severe degenerative arthritis, left knee.  PROCEDURE PERFORMED:  LCS left total knee arthroplasty.   SURGEON: Myra Rude, MD   ASSISTANT: Deeann Saint, MD   ANESTHESIA: Spinal.   COMPLICATIONS: None.   TOURNIQUET TIME: 110 minutes.   ESTIMATED BLOOD LOSS: 150 mL.   DRAINS: Two Autovacs.   DESCRIPTION OF PROCEDURE: Two grams of Ancef was given intravenously prior to the procedure. Spinal anesthesia is induced. The patient is placed in the supine position on the operating room table. The left lower extremity is thoroughly prepped with ChloraPrep and alcohol and draped in standard sterile fashion. The extremity is wrapped out with the Esmarch bandage and pneumatic tourniquet elevated to 350 mmHg. Standard anterior longitudinal incision is made, and the dissection is carried down to the medial and lateral retinaculum. Medial parapatellar incision is made in standard fashion, and the knee is flexed and the patella reflected laterally. Standard synovial and meniscal and osteophyte debridement is then performed. Retractors are placed about the proximal tibia, and the proximal tibial cutter is put into place and pinned.  The proximal tibia is then cut and is seen to be satisfactory. The distal femur is sized as a large, and the drill hole is made for the large cutting block and the central peg placed. The femoral rotation guide, 10 mm, is put into place, and there is seen to be a good fit with stable ligaments and excellent alignment. The femoral cutting block is pinned, and then the anterior and posterior cuts are made. Spacer block is placed into the knee with the knee in flexion and is seen to be stable and a good fit with good alignment.  The distal femoral cutting block is then impacted into  place 5 degrees valgus. The block is pinned, and the distal femoral cut is made. The wound and knee are thoroughly irrigated multiple times with pulsatile lavage. The femoral cutting block is put into place in extension and is seen to be satisfactory with excellent alignment and good ligamentous stability. A femoral shaping guide is impacted into place and the appropriate cuts made. The proximal tibia is sized as a 4, and this is pinned and the central hole made. Trial components are then put into place and seen to be satisfactory with good ligamentous stability. The patella is prepared in the usual manner. All trial components are then removed and the joint is thoroughly irrigated multiple times with pulsatile lavage. The +4 tibial tray is then cemented into place. The 10 mm rotating platform polyethylene component is put into place, and then the porous-coated large femoral component is impacted into place with a small amount of cement in the drill holes and the supracondylar notch. The knee is extended and all cement is removed, and there is seen to be good extension and ligamentous stability. Porous-coated large patellar component is then impacted into place and is seen to be a good fit. Range of motion is excellent. The joint is again thoroughly irrigated multiple times with pulsatile lavage. The medial parapatellar incision is closed with 0 Ethibond. Two Autovac drains are brought out through separate stab wound incisions. Subcutaneous tissue is closed with 3-0 Vicryl, and the skin is closed with the skin stapler. A soft bulky dressing with a Polar Care and a knee immobilizer are applied. The patient is returned  to the recovery room in satisfactory condition having tolerated the procedure quite well.   ____________________________ Clare Gandyhristopher E. Yzabelle Calles, MD ces:cb D: 02/17/2012 08:13:15 ET T: 02/17/2012 10:13:45 ET JOB#: [id number]  cc: Clare Gandyhristopher E. Marvie Brevik, MD, <Dictator> Clare GandyHRISTOPHER E Dyna Figuereo  MD ELECTRONICALLY SIGNED 02/17/2012 13:49

## 2014-05-11 NOTE — H&P (Signed)
PATIENT NAME:  Mark Bean, [name] MR#:  [id number] DATE OF BIRTH:  06-29-1945  DATE OF ADMISSION:  06/08/2013  Addendum  The medication that he was recently started on for a toe infection is doxycycline. One of the side effects can be a pancreatitis. This will be my leading diagnosis of his cause of pancreatitis at this point. If this is secondary to medication, the pancreatitis should resolve soon after stopping the medication. I will continue to treat pancreatitis, as stated earlier, with IV fluids, pain control and n.p.o. Recheck a lipase in the a.m.   I will hold off on surgical consultation at this point since I think it is probably the medication that caused the pancreatitis.  ____________________________ Herschell Dimesichard J. Renae GlossWieting, MD rjw:lb D: 06/08/2013 11:48:18 ET T: 06/08/2013 11:58:55 ET JOB#: [id number]  cc: Herschell Dimesichard J. Renae GlossWieting, MD, <Dictator> Salley ScarletICHARD J Basem Yannuzzi MD ELECTRONICALLY SIGNED 06/09/2013 10:49

## 2014-05-11 NOTE — H&P (Signed)
PATIENT NAME:  Mark Bean, Mark Bean MR#:  [id number] DATE OF BIRTH:  [birth year]  PRIMARY CARE PHYSICIAN: [facility]  DATE OF ADMISSION:  06/08/2013  CHIEF COMPLAINT: Abdominal pain.   HISTORY OF PRESENT ILLNESS: This is a 69 year old man, who was hurting in the abdomen this morning. He went and had breakfast at [place]. He had an egg and tomato sandwich with some coffee and developed severe pain. He went home and then called 911. The pain was described as 8 out of 10 in intensity in the center epigastric area in his abdomen radiating up into his chest a little bit. No nausea or vomiting. No diarrhea or constipation. Nothing made it better except for pain medications here in the Emergency Room. It worsened with breakfast this morning. The patient stated that he just had some surgery on his left foot and was recently put on an antibiotic for a toe tip infection. They do not know what the name of the antibiotic is at this point.   PAST MEDICAL HISTORY: Diabetes, heart disease, hyperlipidemia and Charcot's foot.   PAST SURGICAL HISTORY: Numerous foot surgeries including right foot Charcot foot surgery, second toe amputation, also left knee replacement, CABG and cataracts.   ALLERGIES: No known drug allergies.   MEDICATIONS: As per prescription writer include aspirin 325 mg twice a day, Coreg 12.5 mg twice a day, chlorthalidone 50 mg daily, Celexa 20 mg daily, fish oil 1400 mg daily, Lantus 60 units subcutaneous injection at bedtime, lisinopril 40 mg daily, magnesium hydroxide 8% oral suspension 30 mL twice a day as needed for constipation, metformin 1000 mg twice a day, NovoLog at 30 units subcutaneous injection 3 times a day with meals, simvastatin 40 mg at bedtime. There is an antibiotic that was recently started also, we are trying to obtain the name of this.   SOCIAL HISTORY: No smoking. Very rare alcohol. No drug use. Works [employer] representative.   FAMILY HISTORY: Father died of stomach cancer. Mother died of  lung cancer.   REVIEW OF SYSTEMS: CONSTITUTIONAL: No fever, chills, or sweats. No weight gain. No weight loss. No weakness or fatigue. EYES: No blurry vision. EARS, NOSE, MOUTH AND THROAT: Positive for hearing aids. No sore throat. No difficulty swallowing. CARDIOVASCULAR: Positive for chest pressure with this abdominal pain today. RESPIRATORY: No shortness of breath. No cough. No sputum. No hemoptysis. GASTROINTESTINAL: Positive for abdominal pain. No nausea. No vomiting. No diarrhea. No constipation. No bright red blood per rectum. No melena. GENITOURINARY: No burning on urination or hematuria. MUSCULOSKELETAL: Positive for some toe discomfort. INTEGUMENTARY: Positive for an ulcer on the toe. PSYCHIATRIC: No anxiety or depression. ENDOCRINE: No thyroid problems. HEMATOLOGIC AND LYMPHATIC: No anemia.   PHYSICAL EXAMINATION:  VITAL SIGNS: Temperature 98.2, pulse 70, respirations 19, blood pressure 138/69, pulse oximetry 98% on room air.  GENERAL: No respiratory distress.  EYES: Conjunctivae and lids normal. Pupils equal, round, and reactive to light. Extraocular muscles intact. No nystagmus. EARS, NOSE, MOUTH AND THROAT: Nasal mucosa: No erythema. Throat: No erythema. No exudate seen. Lips and gums: No lesions.  NECK: No JVD. No bruits. No lymphadenopathy. No thyromegaly. No thyroid nodules palpated.  LUNGS: Clear to auscultation. No use of accessory muscles to breathe. No rhonchi, rales, or wheeze heard.  CARDIOVASCULAR: S1, S2 normal. No gallops, rubs, or murmurs heard. Carotid upstroke 2+ bilaterally. No bruits. Dorsalis pedis pulses 1+ bilaterally. Trace edema of the lower extremity.  ABDOMEN: Soft, positive tenderness in the epigastric area. No organomegaly/splenomegaly. Normoactive bowel sounds. No masses  felt.  LYMPHATIC: No lymph nodes in the neck.  MUSCULOSKELETAL: No clubbing. No cyanosis. Trace edema of the lower extremity.  SKIN: Right third toe tip ulcer there with some drainage.   PSYCHIATRIC: The patient is alert, oriented to person, place, and time.   LABORATORY AND RADIOLOGICAL DATA: Ultrasound of the abdomen showed no evidence of acute cholecystitis, a small amount of sludge is suspected, normal common bile duct and fatty infiltration of the liver. Urinalysis negative. White blood cell count 5.6, hemoglobin and hematocrit 12.7 and 38.0, platelet count 174. Lipase 9371. Glucose 209, BUN 24, creatinine 1.03, sodium 138, potassium 3.5, chloride 102, CO2 25, calcium 9.2. Liver function tests: AST slightly elevated at 94. Troponin is negative. EKG: Normal sinus rhythm of 74 beats per minute, no acute changes.   ASSESSMENT AND PLAN:  1.  Acute pancreatitis with a lipase greater than 9000. The patient currently is feeling better with regards to his pain. Will keep n.p.o. and give IV fluids, pain medications and nausea medications IV if needed. The patient does not drink alcohol, so this is not a cause. Possible sludge on the ultrasound could be a potential gallbladder cause. I will check the patient's triglycerides. We still have to confirm what antibiotic he was recently started on.  I will hold the patient's chlorthalidone at this point in time. Lisinopril can also be a potential cause, but less likely since he has been on that for a while. I will get a CT scan of the abdomen and pelvis for further evaluation since this is the first time the patient has a pancreatitis.  2.  Right third toe infection. Follow up as outpatient with podiatry. I need to get the name of the antibiotic and then I will start that up if it does not cause pancreatitis.  3.  Hypertension. Continue current medications minus chlorthalidone.  4.  Hyperlipidemia. We will hold Zocor with elevated liver function tests.  5.  History of coronary artery disease. We will hold aspirin just in case cholecystectomy is needed and continue Coreg.  6.  If CT scan of the abdomen and pelvis is unremarkable and the antibiotic  does not cause pancreatitis, then I may decide to get a surgical consult for evaluation for potential gallstone pancreatitis.   TIME SPENT ON ADMISSION: 55 minutes.   CODE STATUS: The patient is a full code.   ____________________________ Herschell Dimesichard J. Renae GlossWieting, MD rjw:aw D: 06/08/2013 11:38:32 ET T: 06/08/2013 11:55:24 ET JOB#: [id number]  cc: Herschell Dimesichard J. Renae GlossWieting, MD, <Dictator> [facility] Salley ScarletICHARD J Brynlea Spindler MD ELECTRONICALLY SIGNED 06/09/2013 10:49

## 2014-05-11 NOTE — Discharge Summary (Signed)
PATIENT NAME:  Mark Bean, Mark Bean MR#:  [id number] DATE OF BIRTH:  01/06/1946  DATE OF ADMISSION:  06/08/2013  DATE OF DISCHARGE:  06/09/2013  DISCHARGE DIAGNOSES: 1.  Acute pancreatitis, likely doxycycline-induced.  2.  Hypertension.  3.  Coronary artery disease. 4.  Hyperlipidemia.  5.  Elevated liver enzymes, stopped Zocor. AST and ALT in the range of 100.  6.  Benign-looking mass in liver on CT scan. Need follow up MRI in 3 to 4 months to compare and have more details by PMD.   CONDITION ON DISCHARGE: Stable.   CODE STATUS: FULL CODE.   MEDICATIONS ON DISCHARGE: 1.  Chlorthalidone 50 mg oral once a day.  2.  Citalopram 20 mg oral tablet once a day.  3.  Magnesium hydroxide 30 mL oral 2 times a day.  4.  Metformin 1000 mg oral tablet 2 times a day. 5.   Lantus 60 units subcutaneous once a day.  6.  NovoLog 30 units 3 times a day subcutaneous with meals.  7.  Lisinopril 40 mg oral tablet once a day.  8.  Aspirin 325 mg oral once a day.  9.  Ambien 5 mg oral once a day at night.  10.  Percocet 5/325 mg oral tablet twice as needed.  11.  Carvedilol 12.5 mg oral tablet 2 times a day.  12.  Amoxicillin and clavulanate 875 mg oral 2 times a day for 6 days.   Advised to stop taking simvastatin and doxycycline.   INSTRUCTIONS: Diet on discharge: Low-sodium, low-fat, low-cholesterol, carbohydrate-controlled ADA diet, regular consistency diet. Time frame to follow up within 1 to 2 weeks with primary care physician. Also advised to discuss with PMD about mild elevation in AST and ALT, so simvastatin was stopped. Follow LFT in 1 to 2 weeks. Found having benign-looking mass in liver on CT scan of abdomen. Need to have MRI in 3 to 4 months to have more details and comparison.    HISTORY OF PRESENT ILLNESS: A 69 year old male who had complained of pain in abdomen in the morning. He went to breakfast, had some eggs and tomato sandwich and some coffee, and developed severe pain in the abdomen. He  went home and then called 911. As per description, the pain was 8 out of 10 in intensity, in the center epigastric area, radiating into his chest. No nausea or vomiting. No diarrhea or constipation. Nothing made it better, except for pain medication in the Emergency Room. The patient had surgery on his left foot, and was recently started on antibiotics, with doxycycline later on as his wife reported.   HOSPITAL COURSE AND STAY: In the Emergency Room, he was found having elevated lipase level of up to 9000, so admitted with acute pancreatitis for further management. During the hospital stay, we kept him n.p.o. initially with IV fluids and pain management, and his pain resolved with significant improvement in lipase next day, which came less than 1000. The patient felt much better. The workup which was done, including CAT scan of abdomen and sonogram of right upper quadrant, did not show any significant finding or reason for acute pancreatitis, but there was one benign-looking mass in liver of incidental finding, so we attributed his pancreatitis to doxycycline. We stopped it and changed it to Augmentin for his foot infection, and advised to follow with primary care doctor.  For liver mass, advised to follow with primary care in 3 to 4 months to get MRI as outpatient for comparison.  Elevated  LFTs. Mostly this was in acute setting due to pancreatitis. The patient was also taking doxycycline, so stopped that. Statins were stopped, and advised to follow with primary care to check his LFTs in the next few weeks.   Right third toe infection As mentioned above, was on doxy as outpatient. We changed it to Augmentin.   Hypertension. Continue home medication and remain under control.   Hyperlipidemia. We held Zocor with elevated liver enzymes levels.   Coronary artery disease history. We continued aspirin and carvedilol oral. The patient tolerated   diet next day and we discharged him home.   IMPORTANT  LABORATORY RESULTS IN THE HOSPITAL. WBC count was 5.6, hemoglobin was 12.7, and platelet count 174. Lipase was 9371 on admission. BUN 24, creatinine 1.03, sodium 138, potassium 3.5, chloride 102, CO2 is 25, alkaline phosphate 76, SGOT 62 and SGPT 94. Troponin less than 0.02. Urinalysis is grossly negative. Ultrasound of abdomen, limited survey:  No evidence of acute cholecystitis, small amount of sludge, normal common bile duct, fatty infiltrative changes of liver. CT abdomen and pelvis showed bilateral nonobstructing nephrolithiasis, grossly unremarkable pancreas parenchyma, no abdominal fluid, peripancreatic fat-stranding absent. Nonspecific peripherally calcified partially exophytic mass of posterior right hepatic lobe. They were to be of benign etiology. Triglyceride level was 251, HDL was 20, LDL was 29, and total cholesterol was 99.   Total time spent on this discharge is 40 minutes.     ____________________________ Mark Bean Elisabeth Pigeon, MD vgv:mr Bean: 06/12/2013 16:04:09 ET T: 06/12/2013 20:10:23 ET JOB#: [id number]  cc: Mark Bean. Elisabeth Pigeon, MD, <Dictator> Mark Blinks, MD  Altamese Dilling MD ELECTRONICALLY SIGNED 07/04/2013 9:00

## 2014-12-09 ENCOUNTER — Emergency Department
Admission: EM | Admit: 2014-12-09 | Discharge: 2014-12-09 | Disposition: A | Discharge status: Home / Self Care | Payer: No Typology Code available for payment source | Attending: Emergency Medicine | Admitting: Emergency Medicine

## 2014-12-09 ENCOUNTER — Emergency Department: Payer: No Typology Code available for payment source

## 2014-12-09 ENCOUNTER — Encounter: Payer: Self-pay | Admitting: Emergency Medicine

## 2014-12-09 DIAGNOSIS — Z7982 Long term (current) use of aspirin: Secondary | ICD-10-CM | POA: Insufficient documentation

## 2014-12-09 DIAGNOSIS — S0083XA Contusion of other part of head, initial encounter: Secondary | ICD-10-CM | POA: Diagnosis not present

## 2014-12-09 DIAGNOSIS — Z7984 Long term (current) use of oral hypoglycemic drugs: Secondary | ICD-10-CM | POA: Diagnosis not present

## 2014-12-09 DIAGNOSIS — S0990XA Unspecified injury of head, initial encounter: Secondary | ICD-10-CM | POA: Diagnosis present

## 2014-12-09 DIAGNOSIS — Z794 Long term (current) use of insulin: Secondary | ICD-10-CM | POA: Insufficient documentation

## 2014-12-09 DIAGNOSIS — S199XXA Unspecified injury of neck, initial encounter: Secondary | ICD-10-CM | POA: Insufficient documentation

## 2014-12-09 DIAGNOSIS — E119 Type 2 diabetes mellitus without complications: Secondary | ICD-10-CM | POA: Insufficient documentation

## 2014-12-09 DIAGNOSIS — Y998 Other external cause status: Secondary | ICD-10-CM | POA: Diagnosis not present

## 2014-12-09 DIAGNOSIS — I10 Essential (primary) hypertension: Secondary | ICD-10-CM | POA: Insufficient documentation

## 2014-12-09 DIAGNOSIS — Z79899 Other long term (current) drug therapy: Secondary | ICD-10-CM | POA: Diagnosis not present

## 2014-12-09 DIAGNOSIS — Y9389 Activity, other specified: Secondary | ICD-10-CM | POA: Insufficient documentation

## 2014-12-09 DIAGNOSIS — Y9289 Other specified places as the place of occurrence of the external cause: Secondary | ICD-10-CM | POA: Insufficient documentation

## 2014-12-09 DIAGNOSIS — S0093XA Contusion of unspecified part of head, initial encounter: Secondary | ICD-10-CM

## 2014-12-09 MED ORDER — CYCLOBENZAPRINE HCL 5 MG PO TABS: 5.0000 mg | ORAL_TABLET | Freq: Three times a day (TID) | ORAL | Status: DC | PRN

## 2014-12-09 MED ORDER — BUTALBITAL-APAP-CAFFEINE 50-325-40 MG PO TABS: 1.0000 | ORAL_TABLET | Freq: Four times a day (QID) | ORAL | Status: AC | PRN

## 2014-12-09 MED ORDER — BUTALBITAL-APAP-CAFFEINE 50-325-40 MG PO TABS
1.0000 | ORAL_TABLET | Freq: Once | ORAL | Status: AC
  Administered 2014-12-09: 1 via ORAL
  Filled 2014-12-09: qty 1

## 2014-12-09 MED ORDER — CYCLOBENZAPRINE HCL 10 MG PO TABS
5.0000 mg | ORAL_TABLET | Freq: Once | ORAL | Status: AC
  Administered 2014-12-09: 5 mg via ORAL
  Filled 2014-12-09: qty 1
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
## 2015-07-04 ENCOUNTER — Encounter: Payer: Self-pay | Admitting: Emergency Medicine

## 2015-07-04 ENCOUNTER — Emergency Department
Admission: EM | Admit: 2015-07-04 | Discharge: 2015-07-04 | Disposition: A | Discharge status: Home / Self Care | Payer: Medicare Other | Attending: Emergency Medicine | Admitting: Emergency Medicine

## 2015-07-04 DIAGNOSIS — Z7984 Long term (current) use of oral hypoglycemic drugs: Secondary | ICD-10-CM | POA: Diagnosis not present

## 2015-07-04 DIAGNOSIS — I10 Essential (primary) hypertension: Secondary | ICD-10-CM | POA: Diagnosis not present

## 2015-07-04 DIAGNOSIS — M1990 Unspecified osteoarthritis, unspecified site: Secondary | ICD-10-CM | POA: Insufficient documentation

## 2015-07-04 DIAGNOSIS — Z79899 Other long term (current) drug therapy: Secondary | ICD-10-CM | POA: Diagnosis not present

## 2015-07-04 DIAGNOSIS — R197 Diarrhea, unspecified: Secondary | ICD-10-CM | POA: Insufficient documentation

## 2015-07-04 DIAGNOSIS — I252 Old myocardial infarction: Secondary | ICD-10-CM | POA: Insufficient documentation

## 2015-07-04 DIAGNOSIS — Z951 Presence of aortocoronary bypass graft: Secondary | ICD-10-CM | POA: Diagnosis not present

## 2015-07-04 DIAGNOSIS — Z794 Long term (current) use of insulin: Secondary | ICD-10-CM | POA: Diagnosis not present

## 2015-07-04 DIAGNOSIS — E119 Type 2 diabetes mellitus without complications: Secondary | ICD-10-CM | POA: Insufficient documentation

## 2015-07-04 DIAGNOSIS — Z96652 Presence of left artificial knee joint: Secondary | ICD-10-CM | POA: Insufficient documentation

## 2015-07-04 DIAGNOSIS — Z7982 Long term (current) use of aspirin: Secondary | ICD-10-CM | POA: Diagnosis not present

## 2015-07-04 LAB — COMPREHENSIVE METABOLIC PANEL
ALT: 30 U/L (ref 17–63)
AST: 25 U/L (ref 15–41)
Albumin: 4.4 g/dL (ref 3.5–5.0)
Alkaline Phosphatase: 67 U/L (ref 38–126)
Anion gap: 11 (ref 5–15)
BILIRUBIN TOTAL: 1 mg/dL (ref 0.3–1.2)
BUN: 38 mg/dL — AB (ref 6–20)
CHLORIDE: 106 mmol/L (ref 101–111)
CO2: 22 mmol/L (ref 22–32)
CREATININE: 1.35 mg/dL — AB (ref 0.61–1.24)
Calcium: 9.2 mg/dL (ref 8.9–10.3)
GFR calc Af Amer: 60 mL/min — ABNORMAL LOW (ref >60)
GFR, EST NON AFRICAN AMERICAN: 52 mL/min — AB (ref >60)
Glucose, Bld: 251 mg/dL — ABNORMAL HIGH (ref 65–99)
Potassium: 3.9 mmol/L (ref 3.5–5.1)
Sodium: 139 mmol/L (ref 135–145)
Total Protein: 7.4 g/dL (ref 6.5–8.1)

## 2015-07-04 LAB — CBC
HCT: 40.7 % (ref 40.0–52.0)
Hemoglobin: 13.7 g/dL (ref 13.0–18.0)
MCH: 28 pg (ref 26.0–34.0)
MCHC: 33.6 g/dL (ref 32.0–36.0)
MCV: 83.3 fL (ref 80.0–100.0)
PLATELETS: 176 10*3/uL (ref 150–440)
RBC: 4.88 MIL/uL (ref 4.40–5.90)
RDW: 14.3 % (ref 11.5–14.5)
WBC: 4.4 10*3/uL (ref 3.8–10.6)

## 2015-07-04 LAB — TYPE AND SCREEN
ABO/RH(D): A POS
Antibody Screen: NEGATIVE

## 2015-07-04 MED ORDER — LOPERAMIDE HCL 2 MG PO TABS: 2.0000 mg | ORAL_TABLET | Freq: Four times a day (QID) | ORAL | Status: DC | PRN

## 2015-07-04 MED ORDER — LOPERAMIDE HCL 2 MG PO CAPS
2.0000 mg | ORAL_CAPSULE | Freq: Once | ORAL | Status: AC
  Administered 2015-07-04: 2 mg via ORAL
  Filled 2015-07-04: qty 1

## 2015-07-04 NOTE — Discharge Instructions (Signed)

## 2015-07-04 NOTE — ED Notes (Signed)
Pt states all of his stools are black liquid, has lost 15lbs in 2 weeks.

## 2015-07-04 NOTE — ED Notes (Addendum)
Patient states he does not want to stay overnight.  Pt reports he has been having liquid stool that is black. Pt states it has been going on for 2 weeks and thinks he has food poisoning. Pt states he has not had any diarrhea today because he hasn't eaten anything but a half of a donut and a cup of coffee.

## 2015-07-04 NOTE — ED Notes (Signed)
Pt states he has been having diarrhea immediately after every meal for 2 weeks now, denies any abd pain.

## 2015-07-04 NOTE — ED Provider Notes (Signed)
[facility] Emergency Department Provider Note  Time seen: 4:56 PM  I have reviewed the triage vital signs and the nursing notes.   HISTORY  Chief Complaint Diarrhea    HPI Mark Bean is a 70 y.o. male with a past medical history of diabetes, anxiety, hypertension, presents the emergency department with diarrhea. According to the patient for the past 2 weeks he has been having fairly frequent diarrhea. States he does not have diarrhea unless he eats but once he eats within 30 minutes to an hour he will have an episode of diarrhea. States occasionally the diarrhea is dark but also states he has been taking Pepto-Bismol intermittently which has not been helping. Denies any use of Imodium or other over-the-counter medications. Denies any fever, abdominal pain or cramping. Denies any bloody stool. Denies any vomiting.Patient states he has not had any episode of diarrhea today.   Past Medical History  Diagnosis Date  . Myocardial infarction (HCC)   . Diabetes mellitus without complication (HCC)   . Arthritis   . Anxiety   . Hypertension     dr Shonna Chock    [id number]  . Kidney stones   . Pancreatitis, acute     There are no active problems to display for this patient.   Past Surgical History  Procedure Laterality Date  . Cardiac catheterization      2000  . Coronary artery bypass graft      2000   1 stent  . Amputation Right 10/11/2012    Procedure: 2nd ray amputation, medial column fusion and ostectomy right foot;  Surgeon: [name], MD;  Location: [facility] OR;  Service: Orthopedics;  Laterality: Right;  . Ankle fusion Right 10/11/2012    Procedure: ARTHRODESIS ANKLE;  Surgeon: [name], MD;  Location: [facility] OR;  Service: Orthopedics;  Laterality: Right;  . Eye surgery Right     cataract   . Joint replacement Left     knee  . Colonoscopy    . Vasectomy    . I&d extremity Right 11/10/2012    Procedure: IRRIGATION AND DEBRIDEMENT EXTREMITY;  Surgeon:  [name], MD;  Location: [facility] OR;  Service: Orthopedics;  Laterality: Right;  Irrigation and Debridement Right Foot Wound,  Antibiotic Beads and Wound VAC  . Hardware removal Right 01/05/2013    Procedure: HARDWARE REMOVAL;  Surgeon: [name], MD;  Location[facility] OR;  Service: Orthopedics;  Laterality: Right;  Right Foot Debridement, Placement antibiotic beads, remove screw  . Tonsillectomy    . Stents    . Amputation Right 06/15/2013    Procedure: AMPUTATION DIGIT;  Surgeon: [name], MD;  Location: [facility] OR;  Service: Orthopedics;  Laterality: Right;  Right 3rd Toe Amputation    Current Outpatient Rx  Name  Route  Sig  Dispense  Refill  . aspirin 325 MG tablet   Oral   Take 325 mg by mouth at bedtime.         . butalbital-acetaminophen-caffeine (FIORICET) 50-325-40 MG tablet   Oral   Take 1-2 tablets by mouth every 6 (six) hours as needed for headache.   20 tablet   0   . carvedilol (COREG) 12.5 MG tablet   Oral   Take 12.5 mg by mouth 2 (two) times daily with a meal.         . chlorthalidone (HYGROTON) 25 MG tablet   Oral   Take 25 mg by mouth daily.         [name]  citalopram (CELEXA) 20 MG tablet   Oral   Take 20 mg by mouth daily.         . cyclobenzaprine (FLEXERIL) 5 MG tablet   Oral   Take 1 tablet (5 mg total) by mouth every 8 (eight) hours as needed for muscle spasms.   30 tablet   0   . insulin aspart (NOVOLOG) 100 UNIT/ML injection   Subcutaneous   Inject 30-35 Units into the skin 3 (three) times daily with meals.          . insulin glargine (LANTUS) 100 UNIT/ML injection   Subcutaneous   Inject 60 Units into the skin at bedtime.         [name]. lisinopril (PRINIVIL,ZESTRIL) 40 MG tablet   Oral   Take 40 mg by mouth daily.         . metFORMIN (GLUCOPHAGE) 1000 MG tablet   Oral   Take 1,000 mg by mouth 2 (two) times daily with a meal.         . naproxen sodium (ANAPROX) 220 MG tablet   Oral   Take 220-440 mg by mouth 2 (two) times daily  as needed (pain).         . Omega-3 Fatty Acids (FISH OIL TRIPLE STRENGTH) 1400 MG CAPS   Oral   Take 1,400 mg by mouth daily.         . silver sulfADIAZINE (SILVADENE) 1 % cream   Topical   Apply 1 application topically daily as needed (for wound care).         . simvastatin (ZOCOR) 40 MG tablet   Oral   Take 20 mg by mouth at bedtime.           Allergies Review of patient's allergies indicates no known allergies.  No family history on file.  Social History Social History  Substance Use Topics  . Smoking status: Never Smoker   . Smokeless tobacco: Never Used  . Alcohol Use: Yes     Comment: little    Review of Systems Constitutional: Negative for fever. Cardiovascular: Negative for chest pain. Respiratory: Negative for shortness of breath. Gastrointestinal: Negative for abdominal pain. Positive diarrhea. Musculoskeletal: Negative for back pain. Neurological: Negative for headache 10-point ROS otherwise negative.  ____________________________________________   PHYSICAL EXAM:  VITAL SIGNS: ED Triage Vitals  Enc Vitals Group     BP --      Pulse --      Resp --      Temp --      Temp src --      SpO2 --      Weight 07/04/15 1458 225 lb (102.059 kg)     Height 07/04/15 1458 5\' 8"  (1.727 m)     Head Cir --      Peak Flow --      Pain Score 07/04/15 1459 0     Pain Loc --      Pain Edu? --      Excl. in GC? --     Constitutional: Alert and oriented. Well appearing and in no distress. Eyes: Normal exam ENT   Head: Normocephalic and atraumatic.   Mouth/Throat: Mucous membranes are moist. Cardiovascular: Normal rate, regular rhythm. No murmur Respiratory: Normal respiratory effort without tachypnea nor retractions. Breath sounds are clear  Gastrointestinal: Soft and nontender. No distention. Rectal exam shows dark brown stool, guaiac negative. Musculoskeletal: Nontender with normal range of motion in all extremities.  Neurologic:  Normal  speech and  language. No gross focal neurologic deficits Skin:  Skin is warm, dry and intact.  Psychiatric: Mood and affect are normal.   ____________________________________________    INITIAL IMPRESSION / ASSESSMENT AND PLAN / ED COURSE  Pertinent labs & imaging results that were available during my care of the patient were reviewed by me and considered in my medical decision making (see chart for details).  Patient presents for intermittent diarrhea over the past 2 weeks occasionally would dark stool. Rectal exam shows dark brown stool but it is guaiac negative. Nontender abdominal exam. Labs within normal limits. I discussed a trial of loperamide for the diarrhea. I also discussed following up with GI medicine. The patient is agreeable to this plan. I discussed return precautions for worsening diarrhea or abdominal pain or fever. Patient agreeable.  ____________________________________________   FINAL CLINICAL IMPRESSION(S) / ED DIAGNOSES  Diarrhea   Minna Antis, MD 07/04/15 1708

## 2015-10-27 ENCOUNTER — Ambulatory Visit (INDEPENDENT_AMBULATORY_CARE_PROVIDER_SITE_OTHER): Payer: Medicare Other | Admitting: Orthopedic Surgery

## 2015-10-27 DIAGNOSIS — L97411 Non-pressure chronic ulcer of right heel and midfoot limited to breakdown of skin: Secondary | ICD-10-CM

## 2015-10-27 DIAGNOSIS — E11610 Type 2 diabetes mellitus with diabetic neuropathic arthropathy: Secondary | ICD-10-CM

## 2015-11-08 ENCOUNTER — Other Ambulatory Visit (HOSPITAL_COMMUNITY): Payer: Self-pay | Admitting: Family

## 2015-11-26 ENCOUNTER — Encounter (HOSPITAL_COMMUNITY): Payer: Self-pay

## 2015-11-27 ENCOUNTER — Encounter (HOSPITAL_COMMUNITY): Payer: Self-pay

## 2015-11-27 ENCOUNTER — Encounter (HOSPITAL_COMMUNITY)
Admission: RE | Admit: 2015-11-27 | Discharge: 2015-11-27 | Disposition: A | Discharge status: Home / Self Care | Payer: Medicare Other | Source: Ambulatory Visit | Attending: Orthopedic Surgery | Admitting: Orthopedic Surgery

## 2015-11-27 DIAGNOSIS — Z01812 Encounter for preprocedural laboratory examination: Secondary | ICD-10-CM | POA: Insufficient documentation

## 2015-11-27 LAB — BASIC METABOLIC PANEL
Anion gap: 10 (ref 5–15)
BUN: 18 mg/dL (ref 6–20)
CHLORIDE: 103 mmol/L (ref 101–111)
CO2: 28 mmol/L (ref 22–32)
CREATININE: 1.14 mg/dL (ref 0.61–1.24)
Calcium: 9.8 mg/dL (ref 8.9–10.3)
GFR calc Af Amer: >60 mL/min (ref >60)
GFR calc non Af Amer: >60 mL/min (ref >60)
Glucose, Bld: 176 mg/dL — ABNORMAL HIGH (ref 65–99)
Potassium: 4.3 mmol/L (ref 3.5–5.1)
SODIUM: 141 mmol/L (ref 135–145)

## 2015-11-27 LAB — CBC
HCT: 38.4 % — ABNORMAL LOW (ref 39.0–52.0)
HEMOGLOBIN: 12.8 g/dL — AB (ref 13.0–17.0)
MCH: 28.3 pg (ref 26.0–34.0)
MCHC: 33.3 g/dL (ref 30.0–36.0)
MCV: 84.8 fL (ref 78.0–100.0)
Platelets: 180 10*3/uL (ref 150–400)
RBC: 4.53 MIL/uL (ref 4.22–5.81)
RDW: 12.7 % (ref 11.5–15.5)
WBC: 5.3 10*3/uL (ref 4.0–10.5)

## 2015-11-27 LAB — GLUCOSE, CAPILLARY: Glucose-Capillary: 154 mg/dL — ABNORMAL HIGH (ref 65–99)

## 2015-11-27 NOTE — Progress Notes (Signed)
   11/27/15 1328  OBSTRUCTIVE SLEEP APNEA  Have you ever been diagnosed with sleep apnea through a sleep study? No  Do you snore loudly (loud enough to be heard through closed doors)?  1  Do you often feel tired, fatigued, or sleepy during the daytime (such as falling asleep during driving or talking to someone)? 0  Has anyone observed you stop breathing during your sleep? 0  Do you have, or are you being treated for high blood pressure? 1  BMI more than 35 kg/m2? 1  Age > 50 (1-yes) 1  Neck circumference greater than:Male 16 inches or larger, Male 17inches or larger? 1  Male Gender (Yes=1) 1  Obstructive Sleep Apnea Score 6  Score 5 or greater  Results sent to PCP

## 2015-11-27 NOTE — Progress Notes (Addendum)
[facility] Cardiologist - bruce Gwen PoundsKowalski - clearance in care everywhere documents  Chest x-ray - not needed EKG - 10-19/17 requesting abnormal Stress Test - 12/03/14 ECHO - 12/03/14 Cardiac Cath - 2000  Sending to anesthesia for review of records requested   Checks blood sugar 5 times a day fasting BS is 115   Patient denies shortness of breath, fever, cough and chest pain at PAT appointment

## 2015-11-27 NOTE — Pre-Procedure Instructions (Addendum)
Mark NakaiRoger D Bean  11/27/2015      [facility] [place] [address] [place] [address] Phone: [phone] Fax: [phone]    Your procedure is scheduled on November 15  [facility] at 0830 A.M.  Call this number if you have problems the morning of surgery:  [phone]   Remember:  Do not eat food or drink liquids after midnight.   Take these medicines the morning of surgery with A SIP OF WATER carvedilol (COREG),citalopram (CELEXA),  cyclobenzaprine (FLEXERIL)   7 days prior to surgery STOP taking any Aspirin, Aleve, Naproxen, Ibuprofen, Motrin, Advil, Goody's, BC's, all herbal medications, fish oil, and all vitamins    How to Manage Your Diabetes Before and After Surgery  Why is it important to control my blood sugar before and after surgery? . Improving blood sugar levels before and after surgery helps healing and can limit problems. . A way of improving blood sugar control is eating a healthy diet by: o  Eating less sugar and carbohydrates o  Increasing activity/exercise o  Talking with your doctor about reaching your blood sugar goals . High blood sugars (greater than 180 mg/dL) can raise your risk of infections and slow your recovery, so you will need to focus on controlling your diabetes during the weeks before surgery. . Make sure that the doctor who takes care of your diabetes knows about your planned surgery including the date and location.  How do I manage my blood sugar before surgery? . Check your blood sugar at least 4 times a day, starting 2 days before surgery, to make sure that the level is not too high or low. o Check your blood sugar the morning of your surgery when you wake up and every 2 hours until you get to the Short Stay unit. . If your blood sugar is less than 70 mg/dL, you will need to treat for low blood sugar: o Do not take insulin. o Treat  a low blood sugar (less than 70 mg/dL) with  cup of clear juice (cranberry or apple), 4 glucose tablets, OR glucose gel. o Recheck blood sugar in 15 minutes after treatment (to make sure it is greater than 70 mg/dL). If your blood sugar is not greater than 70 mg/dL on recheck, call [phone] for further instructions. . Report your blood sugar to the short stay nurse when you get to Short Stay.  . If you are admitted to the hospital after surgery: o Your blood sugar will be checked by the staff and you will probably be given insulin after surgery (instead of oral diabetes medicines) to make sure you have good blood sugar levels. o The goal for blood sugar control after surgery is 80-180 mg/dL.              WHAT DO I DO ABOUT MY DIABETES MEDICATION?   [name]. Do not take oral diabetes medicines (pills) the morning of surgery. METOFORMIN  . THE NIGHT BEFORE SURGERY, take __insulin glargine (LANTUS)_________ units of _____10______insulin.     Day of surgery take 10 units of lantus .   The day before surgery take normal dose of Novolog as usual    . If your CBG is greater than 220 mg/dL, you may take  of your sliding scale (correction) dose of insulin.    Do not wear jewelry.  Do not wear lotions, powders, or cologne, or deoderant.  Men may shave face and neck.  Do not bring valuables to the hospital.  [facility] is not responsible for any belongings or valuables.  Contacts, dentures or bridgework may not be worn into surgery.  Leave your suitcase in the car.  After surgery it may be brought to your room.  For patients admitted to the hospital, discharge time will be determined by your treatment team.  Patients discharged the day of surgery will not be allowed to drive home.    Special instructions:   [place]- Preparing For Surgery  Before surgery, you can play an important role. Because skin is not sterile, your skin needs to be as free of germs as possible. You can  reduce the number of germs on your skin by washing with CHG (chlorahexidine gluconate) Soap before surgery.  CHG is an antiseptic cleaner which kills germs and bonds with the skin to continue killing germs even after washing.  Please do not use if you have an allergy to CHG or antibacterial soaps. If your skin becomes reddened/irritated stop using the CHG.  Do not shave (including legs and underarms) for at least 48 hours prior to first CHG shower. It is OK to shave your face.  Please follow these instructions carefully.   1. Shower the NIGHT BEFORE SURGERY and the MORNING OF SURGERY with CHG.   2. If you chose to wash your hair, wash your hair first as usual with your normal shampoo.  3. After you shampoo, rinse your hair and body thoroughly to remove the shampoo.  4. Use CHG as you would any other liquid soap. You can apply CHG directly to the skin and wash gently with a scrungie or a clean washcloth.   5. Apply the CHG Soap to your body ONLY FROM THE NECK DOWN.  Do not use on open wounds or open sores. Avoid contact with your eyes, ears, mouth and genitals (private parts). Wash genitals (private parts) with your normal soap.  6. Wash thoroughly, paying special attention to the area where your surgery will be performed.  7. Thoroughly rinse your body with warm water from the neck down.  8. DO NOT shower/wash with your normal soap after using and rinsing off the CHG Soap.  9. Pat yourself dry with a CLEAN TOWEL.   10. Wear CLEAN PAJAMAS   11. Place CLEAN SHEETS on your bed the night of your first shower and DO NOT SLEEP WITH PETS.    Day of Surgery: Do not apply any deodorants/lotions. Please wear clean clothes to the hospital/surgery center.      Please read over the following fact sheets that you were given.

## 2015-11-28 NOTE — Progress Notes (Signed)
Anesthesia chart review: Patient is a 70 year old male scheduled for right BKA on 12/03/15 by Dr. Lajoyce Cornersuda.  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  [facility]. Cardiologist is Dr. Arnoldo HookerBruce Kowalski with [facility](Care Everywhere). Last visit 11/06/15 for follow-up and preoperative clearance for "tow amputation" (athough procedure is now listed as BKA). (He lists PCP as Dr. Kandyce RudMarcus Babaoff.) He wrote, "Proceed to surgery and/or invasive procedure without restriction to pre or post operative and/or procedural care. The patient is at lowest risk possible for cardiovascular complications with surgical intervention and/or invasive procedure. Currently has no evidence active and/or significant angina and/or congestive heart failure. The patient may discontinue aspirin 3 days prior to procedure and restart at a safe period thereafter."  Meds include aspirin 325 mg, Lipitor, Coreg, chlorthalidone, Celexa, Flexeril, NovoLog, Lantus, lisinopril, metformin, fish oil.  11/06/15 EKG [facility]): NSR, inferior infarct (cited on or before 11/18/14).   12/03/14 Nuclear stress test (Care Everywhere): FINDINGS: Regional wall motion:reveals normal myocardial thickening and wall motion. LVEF = 50% Left ventricular cavity: normal. Perfusion Analysis:SPECT images demonstrate homogeneous tracer distribution throughout the myocardium. Result Impression: Indeterminate Lexus scan infusion EKG due to baseline EKG changes. Normal myocardial perfusion without evidence of myocardial ischemia.  12/03/14 Echo (Care Everywhere): INTERPRETATION NORMAL LEFT VENTRICULAR SYSTOLIC FUNCTION WITH AN ESTIMATED EF = 50-55 % NORMAL RIGHT VENTRICULAR SYSTOLIC FUNCTION MILD-TO-MODERATE TRICUSPID AND MITRAL VALVE INSUFFICIENCY TRACE AORTIC  VALVE NO VALVULAR STENOSIS MILD BIATRIAL ENLARGEMENT  Preoperative labs noted. [facility]. He reports home CBGs around 115.  If not acute changes then I would anticipate the he can proceed as planned.   Mark Ochsllison Gertie Broerman, PA-C [facility]/Anesthesiology Phone [phone] 11/28/2015 1:09 PM

## 2015-12-02 ENCOUNTER — Telehealth (INDEPENDENT_AMBULATORY_CARE_PROVIDER_SITE_OTHER): Payer: Self-pay | Admitting: Radiology

## 2015-12-02 MED ORDER — CEFAZOLIN SODIUM-DEXTROSE 2-4 GM/100ML-% IV SOLN
2.0000 g | INTRAVENOUS | Status: AC
  Administered 2015-12-03: 2 g via INTRAVENOUS
  Filled 2015-12-02: qty 100

## 2015-12-02 NOTE — Telephone Encounter (Signed)
Patient's wife Mervyn GayLora calling wanting to confirm time of her husband's surgery that is tomorrow. She has two separate times. Please call her back to confirm. [phone].

## 2015-12-02 NOTE — Telephone Encounter (Signed)
Called. There was no answer.

## 2015-12-03 ENCOUNTER — Encounter (HOSPITAL_COMMUNITY)
Admission: RE | Disposition: A | Discharge status: Home / Self Care | Payer: Self-pay | Source: Ambulatory Visit | Attending: Orthopedic Surgery

## 2015-12-03 ENCOUNTER — Inpatient Hospital Stay (HOSPITAL_COMMUNITY): Payer: Medicare Other | Admitting: Vascular Surgery

## 2015-12-03 ENCOUNTER — Inpatient Hospital Stay (HOSPITAL_COMMUNITY)
Admission: RE | Admit: 2015-12-03 | Discharge: 2015-12-06 | DRG: 617 | Disposition: A | Discharge status: Home / Self Care | Payer: Medicare Other | Source: Ambulatory Visit | Attending: Orthopedic Surgery | Admitting: Orthopedic Surgery

## 2015-12-03 ENCOUNTER — Inpatient Hospital Stay (HOSPITAL_COMMUNITY): Payer: Medicare Other | Admitting: Certified Registered Nurse Anesthetist

## 2015-12-03 DIAGNOSIS — M86271 Subacute osteomyelitis, right ankle and foot: Secondary | ICD-10-CM | POA: Diagnosis present

## 2015-12-03 DIAGNOSIS — I10 Essential (primary) hypertension: Secondary | ICD-10-CM | POA: Diagnosis present

## 2015-12-03 DIAGNOSIS — Z9889 Other specified postprocedural states: Secondary | ICD-10-CM

## 2015-12-03 DIAGNOSIS — Z87442 Personal history of urinary calculi: Secondary | ICD-10-CM

## 2015-12-03 DIAGNOSIS — Z96653 Presence of artificial knee joint, bilateral: Secondary | ICD-10-CM | POA: Diagnosis present

## 2015-12-03 DIAGNOSIS — R2689 Other abnormalities of gait and mobility: Secondary | ICD-10-CM

## 2015-12-03 DIAGNOSIS — Z89421 Acquired absence of other right toe(s): Secondary | ICD-10-CM

## 2015-12-03 DIAGNOSIS — L97519 Non-pressure chronic ulcer of other part of right foot with unspecified severity: Secondary | ICD-10-CM | POA: Diagnosis present

## 2015-12-03 DIAGNOSIS — E11610 Type 2 diabetes mellitus with diabetic neuropathic arthropathy: Secondary | ICD-10-CM | POA: Diagnosis not present

## 2015-12-03 DIAGNOSIS — I252 Old myocardial infarction: Secondary | ICD-10-CM | POA: Diagnosis not present

## 2015-12-03 DIAGNOSIS — Z951 Presence of aortocoronary bypass graft: Secondary | ICD-10-CM

## 2015-12-03 DIAGNOSIS — E11621 Type 2 diabetes mellitus with foot ulcer: Secondary | ICD-10-CM | POA: Diagnosis present

## 2015-12-03 DIAGNOSIS — E1169 Type 2 diabetes mellitus with other specified complication: Principal | ICD-10-CM | POA: Diagnosis present

## 2015-12-03 DIAGNOSIS — M1990 Unspecified osteoarthritis, unspecified site: Secondary | ICD-10-CM | POA: Diagnosis present

## 2015-12-03 DIAGNOSIS — Z89021 Acquired absence of right finger(s): Secondary | ICD-10-CM | POA: Diagnosis not present

## 2015-12-03 DIAGNOSIS — Z89511 Acquired absence of right leg below knee: Secondary | ICD-10-CM

## 2015-12-03 DIAGNOSIS — IMO0002 Reserved for concepts with insufficient information to code with codable children: Secondary | ICD-10-CM

## 2015-12-03 DIAGNOSIS — E1140 Type 2 diabetes mellitus with diabetic neuropathy, unspecified: Secondary | ICD-10-CM | POA: Diagnosis present

## 2015-12-03 HISTORY — PX: APPLICATION OF WOUND VAC: SHX5189

## 2015-12-03 HISTORY — PX: AMPUTATION: SHX166

## 2015-12-03 LAB — ABO/RH: ABO/RH(D): A POS

## 2015-12-03 LAB — GLUCOSE, CAPILLARY
GLUCOSE-CAPILLARY: 291 mg/dL — AB (ref 65–99)
GLUCOSE-CAPILLARY: 295 mg/dL — AB (ref 65–99)
GLUCOSE-CAPILLARY: 260 mg/dL — AB (ref 65–99)
Glucose-Capillary: 310 mg/dL — ABNORMAL HIGH (ref 65–99)

## 2015-12-03 LAB — TYPE AND SCREEN
ABO/RH(D): A POS
Antibody Screen: NEGATIVE

## 2015-12-03 SURGERY — AMPUTATION BELOW KNEE
Anesthesia: General | Site: Leg Lower | Laterality: Right

## 2015-12-03 MED ORDER — ALBUMIN HUMAN 5 % IV SOLN
INTRAVENOUS | Status: DC | PRN
  Administered 2015-12-03: 12:00:00 via INTRAVENOUS

## 2015-12-03 MED ORDER — METHOCARBAMOL 500 MG PO TABS
ORAL_TABLET | ORAL | Status: AC
  Filled 2015-12-03: qty 1

## 2015-12-03 MED ORDER — ONDANSETRON HCL 4 MG PO TABS: 4.0000 mg | ORAL_TABLET | Freq: Four times a day (QID) | ORAL | Status: DC | PRN

## 2015-12-03 MED ORDER — CITALOPRAM HYDROBROMIDE 20 MG PO TABS
20.0000 mg | ORAL_TABLET | Freq: Every day | ORAL | Status: DC
  Administered 2015-12-04 – 2015-12-06 (×3): 20 mg via ORAL
  Filled 2015-12-03 (×3): qty 1

## 2015-12-03 MED ORDER — INSULIN GLARGINE 100 UNIT/ML ~~LOC~~ SOLN
10.0000 [IU] | Freq: Every day | SUBCUTANEOUS | Status: DC
  Administered 2015-12-03 – 2015-12-05 (×3): 10 [IU] via SUBCUTANEOUS
  Filled 2015-12-03 (×4): qty 0.1

## 2015-12-03 MED ORDER — METFORMIN HCL 500 MG PO TABS
1000.0000 mg | ORAL_TABLET | Freq: Two times a day (BID) | ORAL | Status: DC
  Administered 2015-12-03 – 2015-12-06 (×6): 1000 mg via ORAL
  Filled 2015-12-03 (×6): qty 2

## 2015-12-03 MED ORDER — ATORVASTATIN CALCIUM 80 MG PO TABS
80.0000 mg | ORAL_TABLET | Freq: Every day | ORAL | Status: DC
  Administered 2015-12-03 – 2015-12-05 (×3): 80 mg via ORAL
  Filled 2015-12-03 (×4): qty 1

## 2015-12-03 MED ORDER — CHLORHEXIDINE GLUCONATE 4 % EX LIQD: 60.0000 mL | Freq: Once | CUTANEOUS | Status: DC

## 2015-12-03 MED ORDER — INSULIN ASPART 100 UNIT/ML ~~LOC~~ SOLN
4.0000 [IU] | Freq: Three times a day (TID) | SUBCUTANEOUS | Status: DC
  Administered 2015-12-03 – 2015-12-05 (×5): 4 [IU] via SUBCUTANEOUS

## 2015-12-03 MED ORDER — ONDANSETRON HCL 4 MG/2ML IJ SOLN
INTRAMUSCULAR | Status: DC | PRN
  Administered 2015-12-03: 4 mg via INTRAVENOUS

## 2015-12-03 MED ORDER — CEFAZOLIN IN D5W 1 GM/50ML IV SOLN
1.0000 g | Freq: Four times a day (QID) | INTRAVENOUS | Status: AC
  Administered 2015-12-03 – 2015-12-04 (×3): 1 g via INTRAVENOUS
  Filled 2015-12-03 (×3): qty 50

## 2015-12-03 MED ORDER — POLYETHYLENE GLYCOL 3350 17 G PO PACK: 17.0000 g | PACK | Freq: Every day | ORAL | Status: DC | PRN

## 2015-12-03 MED ORDER — SODIUM CHLORIDE 0.9 % IV SOLN
INTRAVENOUS | Status: DC
  Administered 2015-12-04: 05:00:00 via INTRAVENOUS

## 2015-12-03 MED ORDER — LIDOCAINE HCL (CARDIAC) 20 MG/ML IV SOLN
INTRAVENOUS | Status: DC | PRN
  Administered 2015-12-03: 100 mg via INTRAVENOUS

## 2015-12-03 MED ORDER — METOCLOPRAMIDE HCL 5 MG PO TABS
5.0000 mg | ORAL_TABLET | Freq: Three times a day (TID) | ORAL | Status: DC | PRN
Start: 2015-12-03 — End: 2015-12-06

## 2015-12-03 MED ORDER — ASPIRIN 325 MG PO TABS
325.0000 mg | ORAL_TABLET | Freq: Every day | ORAL | Status: DC
  Administered 2015-12-03 – 2015-12-05 (×3): 325 mg via ORAL
  Filled 2015-12-03 (×3): qty 1

## 2015-12-03 MED ORDER — LISINOPRIL 40 MG PO TABS
40.0000 mg | ORAL_TABLET | Freq: Every day | ORAL | Status: DC
  Administered 2015-12-03 – 2015-12-06 (×4): 40 mg via ORAL
  Filled 2015-12-03 (×4): qty 1

## 2015-12-03 MED ORDER — ONDANSETRON HCL 4 MG/2ML IJ SOLN
4.0000 mg | Freq: Four times a day (QID) | INTRAMUSCULAR | Status: DC | PRN
  Administered 2015-12-04: 4 mg via INTRAVENOUS
  Filled 2015-12-03: qty 2

## 2015-12-03 MED ORDER — FENTANYL CITRATE (PF) 100 MCG/2ML IJ SOLN
INTRAMUSCULAR | Status: DC | PRN
  Administered 2015-12-03 (×2): 50 ug via INTRAVENOUS

## 2015-12-03 MED ORDER — METHOCARBAMOL 1000 MG/10ML IJ SOLN: 500.0000 mg | Freq: Four times a day (QID) | INTRAMUSCULAR | Status: DC | PRN

## 2015-12-03 MED ORDER — TRANEXAMIC ACID 1000 MG/10ML IV SOLN
2000.0000 mg | Freq: Once | INTRAVENOUS | Status: DC
  Filled 2015-12-03: qty 20

## 2015-12-03 MED ORDER — METOCLOPRAMIDE HCL 5 MG/ML IJ SOLN: 5.0000 mg | Freq: Three times a day (TID) | INTRAMUSCULAR | Status: DC | PRN

## 2015-12-03 MED ORDER — 0.9 % SODIUM CHLORIDE (POUR BTL) OPTIME
TOPICAL | Status: DC | PRN
  Administered 2015-12-03: 1000 mL

## 2015-12-03 MED ORDER — OXYCODONE HCL 5 MG PO TABS
5.0000 mg | ORAL_TABLET | ORAL | Status: DC | PRN
  Administered 2015-12-03 – 2015-12-04 (×6): 10 mg via ORAL
  Administered 2015-12-04: 5 mg via ORAL
  Administered 2015-12-04: 10 mg via ORAL
  Administered 2015-12-05 (×2): 5 mg via ORAL
  Administered 2015-12-05: 10 mg via ORAL
  Administered 2015-12-06: 5 mg via ORAL
  Filled 2015-12-03 (×4): qty 2
  Filled 2015-12-03 (×2): qty 1
  Filled 2015-12-03 (×2): qty 2
  Filled 2015-12-03 (×2): qty 1
  Filled 2015-12-03: qty 2

## 2015-12-03 MED ORDER — LACTATED RINGERS IV SOLN
INTRAVENOUS | Status: DC
  Administered 2015-12-03 (×3): via INTRAVENOUS

## 2015-12-03 MED ORDER — ACETAMINOPHEN 325 MG PO TABS
650.0000 mg | ORAL_TABLET | Freq: Four times a day (QID) | ORAL | Status: DC | PRN
  Administered 2015-12-04 – 2015-12-05 (×2): 650 mg via ORAL
  Filled 2015-12-03 (×2): qty 2

## 2015-12-03 MED ORDER — HYDROMORPHONE HCL 2 MG/ML IJ SOLN
1.0000 mg | INTRAMUSCULAR | Status: DC | PRN
  Administered 2015-12-03 – 2015-12-04 (×2): 1 mg via INTRAVENOUS
  Filled 2015-12-03 (×2): qty 1

## 2015-12-03 MED ORDER — BISACODYL 5 MG PO TBEC: 5.0000 mg | DELAYED_RELEASE_TABLET | Freq: Every day | ORAL | Status: DC | PRN

## 2015-12-03 MED ORDER — OXYCODONE HCL 5 MG PO TABS
ORAL_TABLET | ORAL | Status: AC
  Filled 2015-12-03: qty 2

## 2015-12-03 MED ORDER — METHOCARBAMOL 500 MG PO TABS
500.0000 mg | ORAL_TABLET | Freq: Four times a day (QID) | ORAL | Status: DC | PRN
  Administered 2015-12-03 – 2015-12-04 (×3): 500 mg via ORAL
  Filled 2015-12-03 (×3): qty 1

## 2015-12-03 MED ORDER — INSULIN ASPART 100 UNIT/ML ~~LOC~~ SOLN
0.0000 [IU] | Freq: Three times a day (TID) | SUBCUTANEOUS | Status: DC
  Administered 2015-12-03: 11 [IU] via SUBCUTANEOUS
  Administered 2015-12-04: 5 [IU] via SUBCUTANEOUS
  Administered 2015-12-04: 2 [IU] via SUBCUTANEOUS
  Administered 2015-12-04 – 2015-12-05 (×2): 8 [IU] via SUBCUTANEOUS
  Administered 2015-12-05: 5 [IU] via SUBCUTANEOUS
  Administered 2015-12-05: 3 [IU] via SUBCUTANEOUS
  Administered 2015-12-06: 8 [IU] via SUBCUTANEOUS
  Administered 2015-12-06: 9 [IU] via SUBCUTANEOUS

## 2015-12-03 MED ORDER — ACETAMINOPHEN 650 MG RE SUPP: 650.0000 mg | Freq: Four times a day (QID) | RECTAL | Status: DC | PRN

## 2015-12-03 MED ORDER — CARVEDILOL 12.5 MG PO TABS
12.5000 mg | ORAL_TABLET | Freq: Two times a day (BID) | ORAL | Status: DC
  Administered 2015-12-03 – 2015-12-06 (×6): 12.5 mg via ORAL
  Filled 2015-12-03 (×6): qty 1

## 2015-12-03 MED ORDER — HYDROMORPHONE HCL 1 MG/ML IJ SOLN: 0.2500 mg | INTRAMUSCULAR | Status: DC | PRN

## 2015-12-03 MED ORDER — CHLORTHALIDONE 25 MG PO TABS
25.0000 mg | ORAL_TABLET | Freq: Every day | ORAL | Status: DC
  Administered 2015-12-03 – 2015-12-06 (×4): 25 mg via ORAL
  Filled 2015-12-03 (×5): qty 1

## 2015-12-03 MED ORDER — PROPOFOL 10 MG/ML IV BOLUS
INTRAVENOUS | Status: DC | PRN
  Administered 2015-12-03: 200 mg via INTRAVENOUS
  Administered 2015-12-03: 50 mg via INTRAVENOUS

## 2015-12-03 MED ORDER — FENTANYL CITRATE (PF) 100 MCG/2ML IJ SOLN
INTRAMUSCULAR | Status: AC
  Filled 2015-12-03: qty 2

## 2015-12-03 MED ORDER — PHENYLEPHRINE HCL 10 MG/ML IJ SOLN
INTRAMUSCULAR | Status: DC | PRN
  Administered 2015-12-03: 10 ug/min via INTRAVENOUS

## 2015-12-03 SURGICAL SUPPLY — 37 items
BLADE SAW RECIP 87.9 MT (BLADE) ×3 IMPLANT
BLADE SURG 21 STRL SS (BLADE) ×3 IMPLANT
BNDG COHESIVE 6X5 TAN STRL LF (GAUZE/BANDAGES/DRESSINGS) ×6 IMPLANT
BNDG GAUZE ELAST 4 BULKY (GAUZE/BANDAGES/DRESSINGS) ×6 IMPLANT
CANISTER WOUND CARE 500ML ATS (WOUND CARE) ×2 IMPLANT
COVER SURGICAL LIGHT HANDLE (MISCELLANEOUS) ×6 IMPLANT
CUFF TOURNIQUET SINGLE 34IN LL (TOURNIQUET CUFF) IMPLANT
CUFF TOURNIQUET SINGLE 44IN (TOURNIQUET CUFF) IMPLANT
DRAPE HALF SHEET 40X57 (DRAPES) ×3 IMPLANT
DRAPE INCISE IOBAN 66X45 STRL (DRAPES) IMPLANT
DRAPE PROXIMA HALF (DRAPES) ×3 IMPLANT
DRAPE U-SHAPE 47X51 STRL (DRAPES) ×3 IMPLANT
DRSG ADAPTIC 3X8 NADH LF (GAUZE/BANDAGES/DRESSINGS) ×3 IMPLANT
DRSG PAD ABDOMINAL 8X10 ST (GAUZE/BANDAGES/DRESSINGS) ×3 IMPLANT
DRSG VAC ATS MED SENSATRAC (GAUZE/BANDAGES/DRESSINGS) ×2 IMPLANT
DURAPREP 26ML APPLICATOR (WOUND CARE) ×3 IMPLANT
ELECT REM PT RETURN 9FT ADLT (ELECTROSURGICAL) ×3
ELECTRODE REM PT RTRN 9FT ADLT (ELECTROSURGICAL) ×1 IMPLANT
GAUZE SPONGE 4X4 12PLY STRL (GAUZE/BANDAGES/DRESSINGS) ×3 IMPLANT
GLOVE BIOGEL PI IND STRL 9 (GLOVE) ×1 IMPLANT
GLOVE BIOGEL PI INDICATOR 9 (GLOVE) ×2
GLOVE SURG ORTHO 9.0 STRL STRW (GLOVE) ×3 IMPLANT
GOWN STRL REUS W/ TWL XL LVL3 (GOWN DISPOSABLE) ×2 IMPLANT
GOWN STRL REUS W/TWL XL LVL3 (GOWN DISPOSABLE) ×6
KIT BASIN OR (CUSTOM PROCEDURE TRAY) ×3 IMPLANT
KIT ROOM TURNOVER OR (KITS) ×3 IMPLANT
MANIFOLD NEPTUNE II (INSTRUMENTS) ×3 IMPLANT
NS IRRIG 1000ML POUR BTL (IV SOLUTION) ×3 IMPLANT
PACK GENERAL/GYN (CUSTOM PROCEDURE TRAY) ×3 IMPLANT
PAD ARMBOARD 7.5X6 YLW CONV (MISCELLANEOUS) ×3 IMPLANT
SPONGE LAP 18X18 X RAY DECT (DISPOSABLE) IMPLANT
STAPLER VISISTAT 35W (STAPLE) IMPLANT
STOCKINETTE IMPERVIOUS LG (DRAPES) ×3 IMPLANT
SUT SILK 2 0 (SUTURE) ×3
SUT SILK 2-0 18XBRD TIE 12 (SUTURE) ×1 IMPLANT
SUT VIC AB 1 CTX 27 (SUTURE) IMPLANT
TOWEL OR 17X26 10 PK STRL BLUE (TOWEL DISPOSABLE) ×3 IMPLANT

## 2015-12-03 NOTE — Op Note (Signed)
   Date of Surgery: 12/03/2015  INDICATIONS: Mr. 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  POSTOPERATIVE DIAGNOSIS: Same.  PROCEDURE: Transtibial amputation right Application of Prevena wound VAC  SURGEON: Lajoyce Cornersuda, M.D.  ANESTHESIA:  general  IV FLUIDS AND URINE: See anesthesia.  ESTIMATED BLOOD LOSS: min mL.  COMPLICATIONS: None.  DESCRIPTION OF PROCEDURE: The patient was brought to the operating room and underwent a general anesthetic. After adequate levels of anesthesia were obtained patient's lower extremity was prepped using DuraPrep draped into a sterile field. A timeout was called. The foot was draped out of the sterile field with impervious stockinette. A transverse incision was made 11 cm distal to the tibial tubercle. This curved proximally and a large posterior flap was created. The tibia was transected 1 cm proximal to the skin incision. The fibula was transected just proximal to the tibial incision. The tibia was beveled anteriorly. A large posterior flap was created. The sciatic nerve was pulled cut and allowed to retract. The vascular bundles were suture ligated with 2-0 silk. The deep and superficial fascial layers were closed using #1 Vicryl. The skin was closed using staples and 2-0 nylon. The wound was covered with a Prevena wound VAC. There was a good suction fit. A prosthetic shrinker was applied. Patient was extubated taken to the PACU in stable condition.  Aldean BakerMarcus Duda, MD [facility] 11:58 AM

## 2015-12-03 NOTE — Transfer of Care (Signed)
Immediate Anesthesia Transfer of Care Note  Patient: Mark Bean  Procedure(s) Performed: Procedure(s): AMPUTATION BELOW RIGHT KNEE (Right)  Patient Location: PACU  Anesthesia Type:General  Level of Consciousness: sedated  Airway & Oxygen Therapy: Patient Spontanous Breathing and Patient connected to face mask oxygen  Post-op Assessment: Report given to RN, Post -op Vital signs reviewed and stable and Patient moving all extremities X 4  Post vital signs: Reviewed and stable  Last Vitals:  Vitals:   12/03/15 0857 12/03/15 1205  BP: (!) 156/68 122/75  Pulse: 62 (!) 59  Resp: 20 13  Temp: 36.8 C 36.1 C    Last Pain:  Vitals:   12/03/15 1205  TempSrc:   PainSc: (P) Asleep      Patients Stated Pain Goal: 3 (12/03/15 0905)  Complications: No apparent anesthesia complications

## 2015-12-03 NOTE — H&P (Signed)
Mark RuffingRoger D Bean is an 70 y.o. male.   Chief Complaint: Osteomyelitis and ulceration right foot status post limb salvage intervention. HPI: Patient is a 70 year old gentleman with diabetic insensate neuropathy status post multiple surgical interventions for limb salvage of the right foot. Due to failure of limb salvage intervention patient presents at this time for right transtibial amputation.  Past Medical History:  Diagnosis Date  . Anxiety   . Arthritis   . Diabetes mellitus without complication (HCC)   . Hypertension    dr Shonna Chockkoswaski    [phone]  . Kidney stones   . Myocardial infarction   . Pancreatitis, acute    caused by medicaion    Past Surgical History:  Procedure Laterality Date  . AMPUTATION Right 10/11/2012   Procedure: 2nd ray amputation, medial column fusion and ostectomy right foot;  Surgeon: Nadara MustardMarcus V Jaella Weinert, MD;  Location: [facility] OR;  Service: Orthopedics;  Laterality: Right;  . AMPUTATION Right 06/15/2013   Procedure: AMPUTATION DIGIT;  Surgeon: Nadara MustardMarcus Arnulfo Batson V, MD;  Location: [facility] OR;  Service: Orthopedics;  Laterality: Right;  Right 3rd Toe Amputation  . ANKLE FUSION Right 10/11/2012   Procedure: ARTHRODESIS ANKLE;  Surgeon: Nadara MustardMarcus V Maghan Jessee, MD;  [facility];  Service: Orthopedics;  Laterality: Right;  . CARDIAC CATHETERIZATION     2000  . COLONOSCOPY    . CORONARY ARTERY BYPASS GRAFT     2000   1 stent  . EYE SURGERY Right    cataract   . HARDWARE REMOVAL Right 01/05/2013   Procedure: HARDWARE REMOVAL;  Surgeon: Nadara MustardMarcus V Demetris Capell, MD;  [facility];  Service: Orthopedics;  Laterality: Right;  Right Foot Debridement, Placement antibiotic beads, remove screw  . I&D EXTREMITY Right 11/10/2012   Procedure: IRRIGATION AND DEBRIDEMENT EXTREMITY;  Surgeon: Nadara MustardMarcus V Janavia Rottman, MD;  Location: [facility] OR;  Service: Orthopedics;  Laterality: Right;  Irrigation and Debridement Right Foot Wound,  Antibiotic Beads and Wound VAC  . JOINT REPLACEMENT Left    knee  . REPLACEMENT TOTAL KNEE Right  02/15/2011  . stents    . TONSILLECTOMY    . VASECTOMY      No family history on file. Social History:  reports that he has never smoked. He has never used smokeless tobacco. He reports that he does not drink alcohol or use drugs.  Allergies: No Known Allergies  No prescriptions prior to admission.    No results found for this or any previous visit (from the past 48 hour(s)). No results found.  Review of Systems  All other systems reviewed and are negative.   There were no vitals taken for this visit. Physical Exam  Patient is alert oriented no adenopathy well-dressed normal affect normal respiratory effort.  Patient has an antalgic gait. Examination patient has ulceration osteomyelitis right foot. Assessment/Plan Assessment: Diabetic insensate neuropathy with ulceration osteomyelitis right foot.  Plan: We'll plan for right transtibial amputation. Risks and benefits were discussed including risk of the wound not healing need for additional surgery. Patient states she understands wish to proceed at this time.  Nadara MustardMarcus V Cashlynn Yearwood, MD 12/03/2015, 6:54 AM

## 2015-12-03 NOTE — Anesthesia Preprocedure Evaluation (Addendum)
Anesthesia Evaluation  Patient identified by MRN, date of birth, ID band Patient awake    Reviewed: Allergy & Precautions, NPO status , Patient's Chart, lab work & pertinent test results  Airway Mallampati: II  TM Distance: >3 FB     Dental   Pulmonary neg pulmonary ROS,    breath sounds clear to auscultation       Cardiovascular hypertension, + Past MI   Rhythm:Regular Rate:Normal     Neuro/Psych    GI/Hepatic negative GI ROS, Neg liver ROS,   Endo/Other  diabetes  Renal/GU Renal disease     Musculoskeletal  (+) Arthritis ,   Abdominal   Peds  Hematology   Anesthesia Other Findings   Reproductive/Obstetrics                            Anesthesia Physical Anesthesia Plan  ASA: III  Anesthesia Plan: General   Post-op Pain Management:    Induction: Intravenous  Airway Management Planned: Oral ETT  Additional Equipment:   Intra-op Plan:   Post-operative Plan: Possible Post-op intubation/ventilation  Informed Consent: I have reviewed the patients History and Physical, chart, labs and discussed the procedure including the risks, benefits and alternatives for the proposed anesthesia with the patient or authorized representative who has indicated his/her understanding and acceptance.   Dental advisory given  Plan Discussed with: CRNA and Anesthesiologist  Anesthesia Plan Comments:         Anesthesia Quick Evaluation

## 2015-12-03 NOTE — Anesthesia Procedure Notes (Signed)
Procedure Name: LMA Insertion Date/Time: 12/03/2015 11:34 AM Performed by: Tillman AbideHAWKINS, Ethelene Closser B Pre-anesthesia Checklist: Patient identified, Emergency Drugs available, Suction available and Patient being monitored Patient Re-evaluated:Patient Re-evaluated prior to inductionOxygen Delivery Method: Circle System Utilized Preoxygenation: Pre-oxygenation with 100% oxygen Intubation Type: IV induction Ventilation: Mask ventilation without difficulty LMA: LMA inserted LMA Size: 5.0 Number of attempts: 1 Airway Equipment and Method: Bite block Placement Confirmation: positive ETCO2 Tube secured with: Tape Dental Injury: Teeth and Oropharynx as per pre-operative assessment

## 2015-12-03 NOTE — Anesthesia Postprocedure Evaluation (Signed)
Anesthesia Post Note  Patient: Mark NakaiRoger D Zilberman  Procedure(s) Performed: Procedure(s) (LRB): AMPUTATION BELOW RIGHT KNEE (Right)  Patient location during evaluation: PACU Anesthesia Type: General Level of consciousness: awake Pain management: pain level controlled Vital Signs Assessment: post-procedure vital signs reviewed and stable Respiratory status: spontaneous breathing Cardiovascular status: stable Anesthetic complications: no    Last Vitals:  Vitals:   12/03/15 1253 12/03/15 1300  BP: (!) 148/85 (!) 145/76  Pulse: 64 66  Resp: 15 16  Temp: 36.1 C 36.8 C    Last Pain:  Vitals:   12/03/15 1318  TempSrc:   PainSc: 0-No pain                 EDWARDS,Rudy Domek

## 2015-12-04 ENCOUNTER — Encounter (HOSPITAL_COMMUNITY): Payer: Self-pay | Admitting: Orthopedic Surgery

## 2015-12-04 LAB — HEMOGLOBIN A1C
HEMOGLOBIN A1C: 9.2 % — AB (ref 4.8–5.6)
MEAN PLASMA GLUCOSE: 217 mg/dL

## 2015-12-04 LAB — GLUCOSE, CAPILLARY
GLUCOSE-CAPILLARY: 219 mg/dL — AB (ref 65–99)
Glucose-Capillary: 293 mg/dL — ABNORMAL HIGH (ref 65–99)
Glucose-Capillary: 205 mg/dL — ABNORMAL HIGH (ref 65–99)
Glucose-Capillary: 169 mg/dL — ABNORMAL HIGH (ref 65–99)

## 2015-12-04 MED ORDER — SODIUM CHLORIDE 0.9% FLUSH
3.0000 mL | Freq: Two times a day (BID) | INTRAVENOUS | Status: DC
  Administered 2015-12-04 – 2015-12-06 (×4): 3 mL via INTRAVENOUS

## 2015-12-04 MED ORDER — SODIUM CHLORIDE 0.9% FLUSH: 3.0000 mL | INTRAVENOUS | Status: DC | PRN

## 2015-12-04 NOTE — Evaluation (Addendum)
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  Clinical Impression  The  Patient was able to stand and pivot to the recliner with assistance. Not yet ready to ambulate. Currently, he may be  WC mobility level. Patient prefers DC to home. Recommend OT consult. Pt admitted with above diagnosis. Pt currently with functional limitations due to the deficits listed below (see PT Problem List).  Pt will benefit from skilled PT to increase their independence and safety with mobility to allow discharge to the venue listed below.       Follow Up Recommendations Home health PT;Supervision/Assistance - 24 hour    Equipment Recommendations  None recommended by PT    Recommendations for Other Services OT consult     Precautions / Restrictions Precautions Precautions: Fall  VAC    Mobility  Bed Mobility Overal bed mobility: Needs Assistance Bed Mobility: Supine to Sit     Supine to sit: Min guard     General bed mobility comments: extra time to work through pain.  Transfers Overall transfer level: Needs assistance Equipment used: Rolling walker (2 wheeled) Transfers: Sit to/from UGI CorporationStand;Stand Pivot Transfers Sit to Stand: Mod assist;+2 physical assistance;+2 safety/equipment Stand pivot transfers: Mod assist;+2 physical assistance;+2 safety/equipment       General transfer comment: patient able to rise from the bed, stood x 1 minute, sat down for a break. Stood 2nd time, scoot pivot  on left foot to turn to recliner, cues to reach back to armrests. Did not actually take hopping steps.   Ambulation/Gait                Stairs            Wheelchair Mobility    Modified Rankin (Stroke Patients Only)       Balance  Overall balance assessment: Needs assistance Sitting-balance support: Feet supported;Bilateral upper extremity supported Sitting balance-Leahy Scale: Good     Standing balance support: During functional activity;Bilateral upper extremity supported Standing balance-Leahy Scale: Poor                               Pertinent Vitals/Pain Pain Assessment: 0-10 Pain Score: 10-Worst pain ever Pain Location: "50" pain residual limb, phantom pain Pain Descriptors / Indicators: Constant;Cramping;Grimacing;Guarding;Spasm;Shooting Pain Intervention(s): Monitored during session;Premedicated before session;Repositioned    Home Living Family/patient expects to be discharged to:: Private residence Living Arrangements: Spouse/significant other;Children;Other relatives;Non-relatives/Friends Available Help at Discharge: Family;Friend(s);Available 24 hours/day Type of Home: House Home Access: Level entry     Home Layout: One level Home Equipment: Walker - 2 wheels;Wheelchair - [employer] Corporationmanual;Bedside commode;Shower seat Additional Comments: has made basement handicapped accessible.    Prior Function Level of Independence: Independent with assistive device(s)               Hand Dominance        Extremity/Trunk Assessment   Upper Extremity Assessment: Overall WFL for tasks assessed           Lower Extremity Assessment: LLE deficits/detail   LLE Deficits / Details: able to stand and scoot to transfer to  recliner.  Cervical / Trunk Assessment: Kyphotic  Communication   Communication: HOH  Cognition Arousal/Alertness: Lethargic;Suspect due to medications Behavior During Therapy: [facility]WFL for tasks assessed/performed Overall  Cognitive Status: Within Functional Limits for tasks assessed                      General Comments      Exercises General Exercises - Lower Extremity Quad Sets: AROM;Right;5 reps   Assessment/Plan    PT Assessment Patient needs continued PT  services  PT Problem List Decreased strength;Decreased range of motion;Decreased activity tolerance;Decreased mobility;Decreased balance;Decreased knowledge of precautions;Decreased safety awareness;Decreased knowledge of use of DME;Pain          PT Treatment Interventions DME instruction;Gait training;Functional mobility training;Therapeutic activities;Therapeutic exercise;Patient/family education;Wheelchair mobility training    PT Goals (Current goals can be found in the Care Plan section)  Acute Rehab PT Goals Patient Stated Goal: to go home PT Goal Formulation: With patient/family Time For Goal Achievement: 12/18/15 Potential to Achieve Goals: Good    Frequency Min 6X/week   Barriers to discharge        Co-evaluation               End of Session Equipment Utilized During Treatment: Gait belt Activity Tolerance: Patient limited by pain Patient left: in chair;with call bell/phone within reach;with family/visitor present Nurse Communication: Mobility status         Time: [id number] PT Time Calculation (min) (ACUTE ONLY): 32 min   Charges:   PT Evaluation $PT Eval Low Complexity: 1 Procedure PT Treatments $Therapeutic Activity: 8-22 mins   PT G Codes:        Sharen HeckHill, Kaylla Cobos Elizabeth Jadie Comas PT [phone]  12/04/2015, 11:55 AM

## 2015-12-04 NOTE — Progress Notes (Signed)
Patient ID: Mark RuffingRoger D Bean, male   DOB: 10/17/1945, 70 y.o.   MRN: [id number] Postoperative day 1 right transtibial amputation. Patient has no drainage and the wound VAC. He is comfortable with no complaints his mood is fully extended. Plan for physical therapy progressive ambulation anticipate discharge to home once he is safe ambulation.

## 2015-12-04 NOTE — Progress Notes (Signed)
Results for [name], Shown D (MRN [id number]) as of 12/04/2015 12:40  Ref. Range 12/03/2015 12:04 12/03/2015 17:50 12/03/2015 20:43 12/04/2015 06:47 12/04/2015 11:40  Glucose-Capillary Latest Ref Range: 65 - 99 mg/dL 782295 (H) 956310 (H) 213260 (H) 293 (H) 219 (H)   Noted that blood sugars continue to be greater than 180 mg/dl. Recommend increasing Lantus to 20 units every HS (half of home dosage), continue Novolog MODERATE correction scale TID, and Novolog 4 units TID as meal coverage and eating at least 50% of meals if blood sugars continue to be elevated.  Will continue to monitor blood sugars while in the hospital. Smith MinceKendra Tinamarie Przybylski RN BSN CDE

## 2015-12-05 LAB — GLUCOSE, CAPILLARY
Glucose-Capillary: 194 mg/dL — ABNORMAL HIGH (ref 65–99)
Glucose-Capillary: 260 mg/dL — ABNORMAL HIGH (ref 65–99)
Glucose-Capillary: 227 mg/dL — ABNORMAL HIGH (ref 65–99)
Glucose-Capillary: 158 mg/dL — ABNORMAL HIGH (ref 65–99)

## 2015-12-05 MED ORDER — OXYCODONE-ACETAMINOPHEN 5-325 MG PO TABS: 1.0000 | ORAL_TABLET | ORAL | 0 refills | Status: DC | PRN

## 2015-12-05 NOTE — Progress Notes (Signed)
Patient ID: Mark RuffingRoger D Bean, male   DOB: 05/18/1945, 70 y.o.   MRN: [id number] Patient states that he feels comfortable for discharge to home at this time. Plan for physical therapy today if patient is safe with therapy he may be discharged after therapy. Patient will need his wound VAC changed out to the Prevena plus portable pump, call the operating room for the pump.

## 2015-12-05 NOTE — Discharge Summary (Signed)
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 male who was admitted 12/03/2015 with a chief complaint of osteomyelitis and ulceration right lower extremity, with a final diagnosis of osteomyelitis and Charcot Collapse Right Foot.  Patient was brought to the operating room on 12/03/2015 and underwent Procedure(s): AMPUTATION BELOW RIGHT KNEE.    Patient was given perioperative antibiotics: Anti-infectives    Start     Dose/Rate Route Frequency Ordered Stop   12/03/15 1730  ceFAZolin (ANCEF) IVPB 1 g/50 mL premix     1 g 100 mL/hr over 30 Minutes Intravenous Every 6 hours 12/03/15 1316 12/04/15 0527   12/03/15 0600  ceFAZolin (ANCEF) IVPB 2g/100 mL premix     2 g 200 mL/hr over 30 Minutes Intravenous To Short Stay 12/02/15 1108 12/03/15 1127    .  Patient was given sequential compression devices, early ambulation, and aspirin for DVT prophylaxis.  Recent vital signs: Patient Vitals for the past 24 hrs:  BP Temp Temp src Pulse Resp SpO2  12/05/15 0439 (!) 128/59 98.1 F (36.7 C) Oral 76 18 95 %  12/04/15 2021 132/63 98.8 F (37.1 C) Axillary 80 16 96 %  12/04/15 1328 (!) 119/55 98.3 F (36.8 C) Oral 74 16 99 %  .  Recent laboratory studies: No results found.  Discharge Medications:     Medication List    TAKE these medications   aspirin 325 MG tablet Take 325 mg by mouth at bedtime.   atorvastatin 80 MG tablet Commonly known as:  LIPITOR Take 80 mg by mouth daily at 6 PM.   butalbital-acetaminophen-caffeine 50-325-40 MG tablet Commonly known as:  FIORICET Take 1-2 tablets by mouth every 6 (six) hours as needed for headache.   carvedilol 12.5 MG  tablet Commonly known as:  COREG Take 12.5 mg by mouth 2 (two) times daily with a meal.   chlorthalidone 50 MG tablet Commonly known as:  HYGROTON Take 25 mg by mouth daily.   cholecalciferol 1000 units tablet Commonly known as:  VITAMIN D Take 1,000 Units by mouth daily.   citalopram 20 MG tablet Commonly known as:  CELEXA Take 20 mg by mouth daily.   cyclobenzaprine 5 MG tablet Commonly known as:  FLEXERIL Take 1 tablet (5 mg total) by mouth every 8 (eight) hours as needed for muscle spasms.   Fish Oil 1200 MG Caps Take 1,200 mg by mouth daily.   insulin aspart 100 UNIT/ML injection Commonly known as:  novoLOG Inject 25 Units into the skin 3 (three) times daily with meals.   insulin glargine 100 UNIT/ML injection Commonly known as:  LANTUS Inject 20 Units into the skin 2 (two) times daily.   lisinopril 40 MG tablet Commonly known as:  PRINIVIL,ZESTRIL Take 40 mg by mouth daily.   loperamide 2 MG tablet Commonly known as:  IMODIUM A-D Take 1 tablet (2 mg total) by mouth 4 (four) times daily as needed for diarrhea or loose stools.   metFORMIN 1000 MG tablet Commonly known as:  GLUCOPHAGE Take 1,000 mg by mouth 2 (two) times daily with a meal.   oxyCODONE-acetaminophen 5-325 MG tablet Commonly known as:  PERCOCET/ROXICET Take 1 tablet by mouth every 4 (four) hours  as needed for severe pain.       Diagnostic Studies: No results found.  Patient benefited maximally from their hospital stay and there were no complications.     Disposition: 01-Home or Self Care Discharge Instructions    Call MD / Call 911    Complete by:  As directed    If you experience chest pain or shortness of breath, CALL 911 and be transported to the hospital emergency room.  If you develope a fever above 101 F, pus (white drainage) or increased drainage or redness at the wound, or calf pain, call your surgeon's office.   Constipation Prevention    Complete by:  As directed    Drink  plenty of fluids.  Prune juice may be helpful.  You may use a stool softener, such as Colace (over the counter) 100 mg twice a day.  Use MiraLax (over the counter) for constipation as needed.   Diet - low sodium heart healthy    Complete by:  As directed    Increase activity slowly as tolerated    Complete by:  As directed    Negative Pressure Wound Therapy - Incisional    Complete by:  As directed    When the VAC, alarms and stops working remove the dressing and sponge. Resume using the stump shrinker sock after VAC removed.     Follow-up Information    Nadara MustardMarcus V Duda, MD Follow up.   Specialty:  Orthopedic Surgery Contact information: [address] [place] [address] [phone]        Follow up Follow up in 1 week(s).            Signed: Nadara MustardMarcus V Duda 12/05/2015, 7:17 AM

## 2015-12-05 NOTE — Plan of Care (Signed)
Problem: Pain Managment: Goal: General experience of comfort will improve Outcome: Progressing Pt has verbalized he's had better pain control during this shift & he's felt comfortable.

## 2015-12-05 NOTE — Care Management Note (Signed)
Case Management Note  Patient Details  Name: Mark RuffingRoger D Bean MRN: [id number] Date of Birth: 01/12/1946  Subjective/Objective:   70 yr old gentleman s/p right BKA.                 Action/Plan: Case manager spoke with Patient 's wife concerning home health and discharge needs. She requested [facility]. Case manager called Ayesha RumpfMary Yonjof, Liaison with [facility] with referral. Patient's wife states she wants him to have care everyday. CM explained to her how Home Health works, stated we could have an aide to assist a couple times a week, should she require more than that they would need to consider a private duty agency as well. Mrs. [name] states [facility] can [facility].  Case manager did [facility] Sophia[facility] and [facility].   Expected Discharge Date:   12/05/15               Expected Discharge Plan:  Home w Home Health Services  In-House Referral:  NA  Discharge planning Services  CM Consult  Post Acute Care Choice:  Home Health Choice offered to:  Spouse  DME Arranged:  N/A DME Agency:     HH Arranged:  PT, Nurse's Aide [facility]  Status of Service:  Completed, signed off  If discussed at [employer] Length of Stay Meetings, dates discussed:    Additional Comments:  [name], [name], RN 12/05/2015, 1:41 PM

## 2015-12-05 NOTE — Progress Notes (Signed)
Dr. Lajoyce Cornersuda contacted about change in pt's mental status. RN went in to help discharge pt, and pt states "where am I?" and that it is 1947. He is alert to self only, and inquiring when he "will have his leg taken off." VSS and no facial asymmetry or drift. Last pain medication was 10 mg oxy at 10 am. Will hold discharge until tomorrow.

## 2015-12-05 NOTE — Progress Notes (Signed)
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    PT Comments    Patient is progressing well toward mobility goals. Pt able to ambulate short distance and perform OOB transfers with min guard/min A for all mobility. Continue to progress as tolerated with anticipated d/c home with HHPT.  Follow Up Recommendations  Home health PT;Supervision/Assistance - 24 hour     Equipment Recommendations  None recommended by PT    Recommendations for Other Services OT consult     Precautions / Restrictions Precautions Precautions: Fall Restrictions Weight Bearing Restrictions: Yes RLE Weight Bearing: Non weight bearing    Mobility  Bed Mobility Overal bed mobility: Needs Assistance Bed Mobility: Supine to Sit     Supine to sit: Min assist     General bed mobility comments: increased time and effort; cues for sequencing/technique; assist to elevate trunk into sitting  Transfers Overall transfer level: Needs assistance Equipment used: Rolling walker (2 wheeled) Transfers: Sit to/from [employer] CorporationStand;Stand Pivot Transfers Sit to Stand: Min assist;+2 safety/equipment Stand pivot transfers: Min assist       General transfer comment: +2 for first trial from EOB and min A +1 from recliner and w/c; cues for safe hand/foot placement and techniques; pt performed multiple sit to stands and stand pivots from recliner to/from w/c and therapist demonstrated squat pivot bed to w/c and w/c management  Ambulation/Gait Ambulation/Gait assistance: Min assist;+2 safety/equipment Ambulation Distance (Feet): 10 Feet Assistive device: Rolling walker (2 wheeled) Gait Pattern/deviations: Step-to pattern     General Gait Details: cues for posture, sequencing,  and proximity of RW   Research scientist (physical sciences)tairs            Wheelchair Mobility Wheelchair Mobility Wheelchair mobility: Yes Wheelchair parts: Supervision/cueing  Modified Rankin (Stroke Patients Only)       Balance     Sitting balance-Leahy Scale: Fair       Standing balance-Leahy Scale: Poor                      Cognition Arousal/Alertness: Awake/alert Behavior During Therapy: WFL for tasks assessed/performed Overall Cognitive Status: Within Functional Limits for tasks assessed                      Exercises Amputee Exercises Quad Sets: AROM;Right;5 reps;Seated Hip ABduction/ADduction: AROM;Right;10 reps;Seated Hip Flexion/Marching: AROM;Right;10 reps;Seated Straight Leg Raises: AROM;Right;10 reps;Seated    General Comments General comments (skin integrity, edema, etc.): family memeber present; pt given HEP handout and reviewed handout; SpO2 between 85-93% on RA throughout session; pt educated on breathing technique and asymptomatic      Pertinent Vitals/Pain Pain Assessment: Faces Faces Pain Scale: Hurts little more Pain Location: R residual limb Pain Descriptors / Indicators: Grimacing;Guarding;Sharp Pain Intervention(s): Limited activity within patient's tolerance;Monitored during session;Repositioned;RN gave pain meds during session    Home Living                      Prior Function            PT Goals (current goals can now be found in the care plan section) Acute Rehab PT Goals Patient Stated Goal: to go home Progress towards PT goals: Progressing toward goals    Frequency    Min 6X/week  PT Plan Current plan remains appropriate    Co-evaluation             End of Session Equipment Utilized During Treatment: Gait belt Activity Tolerance: Patient tolerated treatment well Patient left: in chair;with call bell/phone within reach;with chair alarm set     Time: [id number] PT Time Calculation (min) (ACUTE ONLY): 54  min  Charges:  $Gait Training: 8-22 mins $Therapeutic Exercise: 8-22 mins $Therapeutic Activity: 8-22 mins $Wheel Chair Management: 8-22 mins                    G Codes:      Derek MoundKellyn R Deb Loudin Leesha Veno, PTA Pager: [phone]   12/05/2015, 11:12 AM

## 2015-12-06 LAB — GLUCOSE, CAPILLARY
GLUCOSE-CAPILLARY: 224 mg/dL — AB (ref 65–99)
GLUCOSE-CAPILLARY: 256 mg/dL — AB (ref 65–99)

## 2015-12-06 NOTE — Progress Notes (Signed)
Reviewed discharge instructions/medications with patient and patient's wife.  Answered all of their questions.  Switched patient over to Previna Wound Vac. Patient's wife states he has a scratch on his face that was there after surgery.  Wants to know how it happened.

## 2015-12-06 NOTE — Progress Notes (Signed)
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    PT Comments    Pt presents with mild confusion today. He is requiring decreased assist with mobility. Plan is for d/c home today. He has all needed DME. Recommend 24-hour assist from family and HHPT.  Follow Up Recommendations  Home health PT;Supervision/Assistance - 24 hour     Equipment Recommendations  None recommended by PT    Recommendations for Other Services       Precautions / Restrictions Precautions Precautions: Fall Restrictions Weight Bearing Restrictions: Yes RLE Weight Bearing: Non weight bearing    Mobility  Bed Mobility               General bed mobility comments: Pt received sitting EOB.  Transfers   Equipment used: Rolling walker (2 wheeled)   Sit to Stand: Min guard Stand pivot transfers: Min guard       General transfer comment: verbal cues for hand placement. No physical assist needed.  Ambulation/Gait Ambulation/Gait assistance: Min guard;+2 safety/equipment Ambulation Distance (Feet): 20 Feet   Gait Pattern/deviations: Step-to pattern Gait velocity: decreased   General Gait Details: short hopping steps   Stairs            Wheelchair Mobility    Modified Rankin (Stroke Patients Only)       Balance   Sitting-balance support: Feet supported;No upper extremity supported Sitting balance-Leahy Scale: Good     Standing balance support: Bilateral upper extremity supported;During functional activity Standing balance-Leahy Scale: Poor Standing balance comment: heavy reliance on RW due to BKA                    Cognition Arousal/Alertness: Awake/alert Behavior During Therapy: WFL for tasks  assessed/performed Overall Cognitive Status: Impaired/Different from baseline Area of Impairment: Orientation;Memory;Problem solving Orientation Level: Disoriented to;Situation   Memory: Decreased short-term memory       Problem Solving: Slow processing      Exercises Amputee Exercises Quad Sets: Right;AROM;5 reps;Seated Knee Flexion: AROM;Right;5 reps;Seated    General Comments        Pertinent Vitals/Pain Pain Assessment: No/denies pain    Home Living                      Prior Function            PT Goals (current goals can now be found in the care plan section) Acute Rehab PT Goals Patient Stated Goal: to go home PT Goal Formulation: With patient/family Time For Goal Achievement: 12/18/15 Potential to Achieve Goals: Good Progress towards PT goals: Progressing toward goals    Frequency    Min 6X/week      PT Plan Current plan remains appropriate    Co-evaluation             End of Session Equipment Utilized During Treatment: Gait belt Activity Tolerance: Patient tolerated treatment well Patient left: in chair;with chair alarm set;with family/visitor present;with call bell/phone within reach     Time: 0946-0959 PT Time Calculation (min) (ACUTE ONLY): 13 min  Charges:  $Gait Training: 8-22 mins                    G Codes:      Ilda FoilGarrow, Maks Cavallero Rene 12/06/2015, 11:02 AM

## 2015-12-06 NOTE — Progress Notes (Signed)
Subjective: Patient stable.  Pins his right leg stump well without pain.  He is ready to be discharged to home   Objective: Vital signs in last 24 hours: Temp:  [97.6 F (36.4 C)-100.2 F (37.9 C)] 97.6 F (36.4 C) (11/18 0444) Pulse Rate:  [70-87] 70 (11/18 0444) Resp:  [16] 16 (11/18 0444) BP: (113-130)/(58) 113/58 (11/18 0444) SpO2:  [90 %-94 %] 92 % (11/18 0444)  Intake/Output from previous day: 11/17 0701 - 11/18 0700 In: 120 [P.O.:120] Out: 700 [Urine:700] Intake/Output this shift: No intake/output data recorded.  Exam:  No cellulitis present Compartment soft Alert and oriented 3  Labs: No results for input(s): HGB in the last 72 hours. No results for input(s): WBC, RBC, HCT, PLT in the last 72 hours. No results for input(s): NA, K, CL, CO2, BUN, CREATININE, GLUCOSE, CALCIUM in the last 72 hours. No results for input(s): LABPT, INR in the last 72 hours.  Assessment/Plan: Plan discharge today.  No apparent issues with mental status at this time   Burnard BuntingG Scott Kasidi Shanker 12/06/2015, 9:45 AM

## 2015-12-08 ENCOUNTER — Encounter (INDEPENDENT_AMBULATORY_CARE_PROVIDER_SITE_OTHER): Payer: Self-pay | Admitting: Orthopedic Surgery

## 2015-12-08 ENCOUNTER — Ambulatory Visit (INDEPENDENT_AMBULATORY_CARE_PROVIDER_SITE_OTHER): Payer: Medicare Other | Admitting: Orthopedic Surgery

## 2015-12-08 VITALS — Ht 68.0 in | Wt 237.0 lb

## 2015-12-08 DIAGNOSIS — Z89511 Acquired absence of right leg below knee: Secondary | ICD-10-CM

## 2015-12-08 DIAGNOSIS — IMO0002 Reserved for concepts with insufficient information to code with codable children: Secondary | ICD-10-CM

## 2015-12-08 NOTE — Progress Notes (Signed)
Wound Care Note   Patient: Mark RuffingRoger D Lempke           Date of Birth: 05/22/1945           MRN: [id number]             [facility] Visit Date: 12/08/2015   Assessment & Plan: Visit Diagnoses:  1. Below knee amputation status, right (HCC)     Plan: Begin daily Dial soap cleansing. Dry dressings Ace wrap and shrinker. Elevation.  Follow-Up Instructions: Return in about 2 weeks (around 12/22/2015).  Orders:  No orders of the defined types were placed in this encounter.  No orders of the defined types were placed in this encounter.     Procedures: No notes on file   Clinical Data: No additional findings.   No images are attached to the encounter.   Subjective: Chief Complaint  Patient presents with  . Right Knee - Routine Post Op    12/03/15- Transtibial amputation right Application of Prevena wound VAC    Patient was status post transtibial amputation right with application of prevena wound VAC on 12/03/15. He is 5 days post op. Prevena wound vac removed today. There is minimal drainage laterally. There was no drainage in cannister. He is wearing vive compression shrinker. He is taking oxycodone for his pain and feels this is managing it well.    Review of Systems  Constitutional: Negative for chills and fever.    Miscellaneous:  -Home Health Care: [facility]  -Physical Therapy: Yes, safety in home   Objective: Vital Signs: Ht 5\' 8"  (1.727 m)   Wt 237 lb (107.5 kg)   BMI 36.04 kg/m   Physical Exam: Incision is well approximated with staples. There is just moderate swelling. Scant bloody drainage. No purulence no erythema no sign of infection.  Specialty Comments: No specialty comments available.   PMFS History: Patient Active Problem List   Diagnosis Date Noted  . Below knee amputation status, right (HCC) 12/03/2015  . 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 Subacute osteomyelitis, right ankle and foot (HCC)     Past Medical History:  Diagnosis Date  . Anxiety   . Arthritis   . Diabetes mellitus without complication (HCC)   . Hypertension    dr Shonna [place]i    [phone]  . Kidney stones   . Myocardial infarction   . Pancreatitis, acute    caused by medicaion    History reviewed. No pertinent family history. Past Surgical History:  Procedure Laterality Date  . AMPUTATION Right 10/11/2012   Procedure: 2nd ray amputation, medial column fusion and ostectomy right foot;  Surgeon: Nadara MustardMarcus V Duda, MD;  [facility]: [facility];  Service: Orthopedics;  Laterality: Right;  . AMPUTATION Right 06/15/2013   Procedure: AMPUTATION DIGIT;  Surgeon: Nadara MustardMarcus Duda V, MD;  Location: [facility] OR;  Service: Orthopedics;  Laterality: Right;  Right 3rd Toe Amputation  . AMPUTATION Right 12/03/2015   Procedure: AMPUTATION BELOW RIGHT KNEE;  Surgeon: Nadara MustardMarcus V Duda, MD;  Location: [facility] OR;  Service: Orthopedics;  Laterality: Right;  . ANKLE FUSION Right 10/11/2012   Procedure: ARTHRODESIS ANKLE;  Surgeon: Nadara MustardMarcus V Duda, MD;  [facility];  Service: Orthopedics;  Laterality: Right;  . APPLICATION OF WOUND VAC  12/03/2015  . CARDIAC CATHETERIZATION     2000  . COLONOSCOPY    . CORONARY ARTERY BYPASS GRAFT     2000   1 stent  . EYE SURGERY Right  cataract   . HARDWARE REMOVAL Right 01/05/2013   Procedure: HARDWARE REMOVAL;  Surgeon: Nadara MustardMarcus V Duda, MD;  [facility];  Service: Orthopedics;  Laterality: Right;  Right Foot Debridement, Placement antibiotic beads, remove screw  . I&D EXTREMITY Right 11/10/2012   Procedure: IRRIGATION AND DEBRIDEMENT EXTREMITY;  Surgeon: Nadara MustardMarcus V Duda, MD;  Location: [facility] OR;  Service: Orthopedics;  Laterality: Right;  Irrigation and Debridement Right Foot Wound,  Antibiotic Beads and Wound VAC  . JOINT REPLACEMENT Left    knee  . REPLACEMENT TOTAL KNEE Right 02/15/2011  . stents    . TONSILLECTOMY    . VASECTOMY     Social History   Occupational History  . Not on file.   Social History Main  Topics  . Smoking status: Never Smoker  . Smokeless tobacco: Never Used  . Alcohol use No     Comment: little  . Drug use: No  . Sexual activity: Not on file

## 2015-12-09 ENCOUNTER — Telehealth (INDEPENDENT_AMBULATORY_CARE_PROVIDER_SITE_OTHER): Payer: Self-pay | Admitting: Radiology

## 2015-12-09 NOTE — Telephone Encounter (Signed)
Arline AspCindy [facility], I advised ok, 2-3 x week.

## 2015-12-10 ENCOUNTER — Ambulatory Visit (INDEPENDENT_AMBULATORY_CARE_PROVIDER_SITE_OTHER): Payer: Medicare Other | Admitting: Orthopedic Surgery

## 2015-12-18 ENCOUNTER — Telehealth (INDEPENDENT_AMBULATORY_CARE_PROVIDER_SITE_OTHER): Payer: Self-pay | Admitting: *Deleted

## 2015-12-18 NOTE — Telephone Encounter (Signed)
Mark Bean From physical therapy called requesting a rollator walker with a seat. [facility] in [place] is where it can be faxed [phone]. They will need order, diagnosis and pt demographics.

## 2015-12-19 ENCOUNTER — Other Ambulatory Visit (INDEPENDENT_AMBULATORY_CARE_PROVIDER_SITE_OTHER): Payer: Self-pay

## 2015-12-22 ENCOUNTER — Encounter (INDEPENDENT_AMBULATORY_CARE_PROVIDER_SITE_OTHER): Payer: Self-pay | Admitting: Orthopedic Surgery

## 2015-12-22 ENCOUNTER — Ambulatory Visit (INDEPENDENT_AMBULATORY_CARE_PROVIDER_SITE_OTHER): Payer: Medicare Other | Admitting: Orthopedic Surgery

## 2015-12-22 DIAGNOSIS — Z89511 Acquired absence of right leg below knee: Secondary | ICD-10-CM

## 2015-12-22 NOTE — Progress Notes (Signed)
Office Visit Note   Patient: Mark Bean           Date of Birth: 02/15/1945           MRN: [id number] Visit Date: 12/22/2015              [facility] by: [facility] [address]  [place], [place] [address] [facility]   Assessment & Plan: Visit Diagnoses:  1. S/P below knee amputation, right (HCC)     Plan:  Plan follow-up in the office in 4 weeks. Patient is given a prescription for [employer] for prosthetic shrinker and a cane 3 level prosthesis. Patient works in [employer] he is on uneven terrain he has to walk up and down ladders on uneven surfaces as well as up and down bleachers and will require K three-level prosthesis for his activities of daily living for work. Follow-Up Instructions: Return in about 4 weeks (around 01/19/2016).   Orders:  No orders of the defined types were placed in this encounter.  No orders of the defined types were placed in this encounter.     Procedures: No procedures performed   Clinical Data: No additional findings.   Subjective: Chief Complaint  Patient presents with  . Right Leg - Follow-up  . Follow-up    Patient states doing okay.  Staples still intact. Looks a little red.      Review of Systems   Objective: Vital Signs: There were no vitals taken for this visit.  Physical Exam On examination patient is 3 weeks out transtibial amputation. The incision has some redness but there is no swelling the skin is wrinkling well there is no drainage no odor no signs of infection. A harvest the staples today A she was given a second stump shrinker he now has a triple XL and a double XL shrinker. Ortho Exam  Specialty Comments:  No specialty comments available.  Imaging: No results found.   PMFS History: Patient Active Problem List   Diagnosis Date Noted  . S/P below knee amputation, right (HCC) 12/03/2015  . 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 Subacute osteomyelitis,  right ankle and foot (HCC)    Past Medical History:  Diagnosis Date  . Anxiety   . Arthritis   . Diabetes mellitus without complication (HCC)   . Hypertension    dr Shonna Ch[place]    [phone]  . Kidney stones   . Myocardial infarction   . Pancreatitis, acute    caused by medicaion    No family history on file.  Past Surgical History:  Procedure Laterality Date  . AMPUTATION Right 10/11/2012   Procedure: 2nd ray amputation, medial column fusion and ostectomy right foot;  Surgeon: Nadara MustardMarcus V Dandrae Kustra, MD;  [facility];  Service: Orthopedics;  Laterality: Right;  . AMPUTATION Right 06/15/2013   Procedure: AMPUTATION DIGIT;  Surgeon: Nadara MustardMarcus Challis Crill V, MD;  [facility];  Service: Orthopedics;  Laterality: Right;  Right 3rd Toe Amputation  . AMPUTATION Right 12/03/2015   Procedure: AMPUTATION BELOW RIGHT KNEE;  Surgeon: Nadara MustardMarcus V Niveah Boerner, MD;  [facility];  Service: Orthopedics;  Laterality: Right;  . ANKLE FUSION Right 10/11/2012   Procedure: ARTHRODESIS ANKLE;  Surgeon: Nadara MustardMarcus V Jaqualyn Juday, MD;  [facility];  Service: Orthopedics;  Laterality: Right;  . APPLICATION OF WOUND VAC  12/03/2015  . CARDIAC CATHETERIZATION     2000  . COLONOSCOPY    . CORONARY ARTERY BYPASS  GRAFT     2000   1 stent  . EYE SURGERY Right    cataract   . HARDWARE REMOVAL Right 01/05/2013   Procedure: HARDWARE REMOVAL;  Surgeon: Nadara MustardMarcus V Kiyoto Slomski, MD;  [facility];  Service: Orthopedics;  Laterality: Right;  Right Foot Debridement, Placement antibiotic beads, remove screw  . I&D EXTREMITY Right 11/10/2012   Procedure: IRRIGATION AND DEBRIDEMENT EXTREMITY;  Surgeon: Nadara MustardMarcus V Eletha Culbertson, MD;  [facility];  Service: Orthopedics;  Laterality: Right;  Irrigation and Debridement Right Foot Wound,  Antibiotic Beads and Wound VAC  . JOINT REPLACEMENT Left    knee  . REPLACEMENT TOTAL KNEE Right 02/15/2011  . stents    . TONSILLECTOMY    . VASECTOMY     Social History   Occupational History  . Not on file.   Social History  Main Topics  . Smoking status: Never Smoker  . Smokeless tobacco: Never Used  . Alcohol use No     Comment: little  . Drug use: No  . Sexual activity: Not on file

## 2015-12-24 ENCOUNTER — Telehealth (INDEPENDENT_AMBULATORY_CARE_PROVIDER_SITE_OTHER): Payer: Self-pay | Admitting: Orthopedic Surgery

## 2015-12-24 NOTE — Telephone Encounter (Signed)
Mark Bean (PT) with [facility] called advised need to cancel order for a rolling walker. [facility] The number to contact her is [phone]

## 2015-12-25 NOTE — Telephone Encounter (Signed)
Noted  

## 2015-12-29 ENCOUNTER — Telehealth (INDEPENDENT_AMBULATORY_CARE_PROVIDER_SITE_OTHER): Payer: Self-pay

## 2015-12-29 NOTE — Telephone Encounter (Signed)
Trey PaulaJeff with [facility] would like verbal orders for skilled nursing for wound care for right BKA and change dressings 2 times a week?  Call back number is [phone].

## 2015-12-30 NOTE — Telephone Encounter (Signed)
I called left voicemail giving verbal ok for this patient's wound care.

## 2015-12-31 ENCOUNTER — Ambulatory Visit (INDEPENDENT_AMBULATORY_CARE_PROVIDER_SITE_OTHER): Payer: Medicare Other | Admitting: Family

## 2015-12-31 DIAGNOSIS — Z89511 Acquired absence of right leg below knee: Secondary | ICD-10-CM

## 2015-12-31 MED ORDER — DOXYCYCLINE HYCLATE 100 MG PO TABS: 100.0000 mg | ORAL_TABLET | Freq: Two times a day (BID) | ORAL | 0 refills | Status: DC

## 2015-12-31 MED ORDER — GABAPENTIN 100 MG PO CAPS: ORAL_CAPSULE | ORAL | 1 refills | Status: DC

## 2015-12-31 NOTE — Progress Notes (Signed)
Office Visit Note   Patient: Mark Bean           Date of Birth: 09/28/1945           MRN: [id number] Visit Date: 12/31/2015              [facility] [address]  [place], [place] [address] [facility]   Assessment & Plan: Visit Diagnoses:  1. S/P below knee amputation, right (HCC)     Plan: Central wound packed open with Iodosorb dressing today. Iodosorb placed on other areas of eschar. Wrapped with Ace wrap. Will follow with [employer] for an appropriately sized shrinker. Follow-up as scheduled on January 4.   Follow-Up Instructions: Return as scheduled.   Orders:  No orders of the defined types were placed in this encounter.  Meds ordered this encounter  Medications  . gabapentin (NEURONTIN) 100 MG capsule    Sig: Take 1 daily at bedtime times 1 week, then 1 tablet twice a day times 2 weeks, then 1 tablet 3 times a day    Dispense:  90 capsule    Refill:  1  . doxycycline (VIBRA-TABS) 100 MG tablet    Sig: Take 1 tablet (100 mg total) by mouth 2 (two) times daily.    Dispense:  28 tablet    Refill:  0      Procedures: No procedures performed   Clinical Data: No additional findings.   Subjective: Chief Complaint  Patient presents with  . Right Leg - Routine Post Op    Right bka 12/03/15    Patient is 4 weeks s/p right below the knee amputation.  The incision is red, slightly swollen and has an odor. There is a moderate amount of yellow drainage. There is a dry dressing in place. [facility] is providing home health. They will do dressing changes 3 times a week.    Review of Systems  Constitutional: Negative for chills and fever.  Skin: Positive for wound.     Objective: Vital Signs: There were no vitals taken for this visit.  Physical Exam  Constitutional: He is oriented to person, place, and time. He appears well-developed and well-nourished.  Pulmonary/Chest: Effort normal.  Musculoskeletal:  Scattered  eschar along incision. Centrally there is a 15 mm area that is 10 mm deep. This is filled in with exudative and necrotic tissue. There is no drainage. Does have a foul odor. Some exposed suture medially this was harvested today. Lateral eschar debrided with a 10 blade knife back to bleeding viable tissue. No cellulitis.   Neurological: He is alert and oriented to person, place, and time.  Psychiatric: He has a normal mood and affect.  Nursing note reviewed.   Ortho Exam  Specialty Comments:  No specialty comments available.  Imaging: No results found.   PMFS History: Patient Active Problem List   Diagnosis Date Noted  . S/P below knee amputation, right (HCC) 12/03/2015  . 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 Subacute osteomyelitis, right ankle and foot (HCC)    Past Medical History:  Diagnosis Date  . Anxiety   . Arthritis   . Diabetes mellitus without complication (HCC)   . Hypertension    dr Shonna Chock[place]i    [phone]  . Kidney stones   . Myocardial infarction   . Pancreatitis, acute    caused by medicaion    No family history on file.  Past Surgical History:  Procedure Laterality Date  . AMPUTATION Right 10/11/2012   Procedure: 2nd ray amputation, medial column fusion and ostectomy right foot;  Surgeon: Nadara MustardMarcus V Duda, MD;  [facility];  Service: Orthopedics;  Laterality: Right;  . AMPUTATION Right 06/15/2013   Procedure: AMPUTATION DIGIT;  Surgeon: Nadara MustardMarcus Duda V, MD;  [facility];  Service: Orthopedics;  Laterality: Right;  Right 3rd Toe Amputation  . AMPUTATION Right 12/03/2015   Procedure: AMPUTATION BELOW RIGHT KNEE;  Surgeon: Nadara MustardMarcus V Duda, MD;  [facility];  Service: Orthopedics;  Laterality: Right;  . ANKLE FUSION Right 10/11/2012   Procedure: ARTHRODESIS ANKLE;  Surgeon: Nadara MustardMarcus V Duda, MD;  [facility];  Service: Orthopedics;  Laterality: Right;  . APPLICATION OF WOUND VAC  12/03/2015  . CARDIAC CATHETERIZATION     2000  .  COLONOSCOPY    . CORONARY ARTERY BYPASS GRAFT     2000   1 stent  . EYE SURGERY Right    cataract   . HARDWARE REMOVAL Right 01/05/2013   Procedure: HARDWARE REMOVAL;  Surgeon: Nadara MustardMarcus V Duda, MD;  [facility];  Service: Orthopedics;  Laterality: Right;  Right Foot Debridement, Placement antibiotic beads, remove screw  . I&D EXTREMITY Right 11/10/2012   Procedure: IRRIGATION AND DEBRIDEMENT EXTREMITY;  Surgeon: Nadara MustardMarcus V Duda, MD;  [facility];  Service: Orthopedics;  Laterality: Right;  Irrigation and Debridement Right Foot Wound,  Antibiotic Beads and Wound VAC  . JOINT REPLACEMENT Left    knee  . REPLACEMENT TOTAL KNEE Right 02/15/2011  . stents    . TONSILLECTOMY    . VASECTOMY     Social History   Occupational History  . Not on file.   Social History Main Topics  . Smoking status: Never Smoker  . Smokeless tobacco: Never Used  . Alcohol use No     Comment: little  . Drug use: No  . Sexual activity: Not on file

## 2016-01-01 ENCOUNTER — Telehealth (INDEPENDENT_AMBULATORY_CARE_PROVIDER_SITE_OTHER): Payer: Self-pay | Admitting: Orthopedic Surgery

## 2016-01-01 ENCOUNTER — Telehealth (INDEPENDENT_AMBULATORY_CARE_PROVIDER_SITE_OTHER): Payer: Self-pay | Admitting: *Deleted

## 2016-01-01 NOTE — Telephone Encounter (Signed)
[facility] 1x a week for 4 weeks. CB: [phone]

## 2016-01-01 NOTE — Telephone Encounter (Signed)
Pt wife Mervyn GayLora requesting meds Gabapentin and Doxycycline to be refilled for pt. Pt was seen yesterday and prescibed these. Wife Mervyn GayLora called in upset and said it was suppose to be sent in yesterday. Lora's number is [phone]

## 2016-01-01 NOTE — Telephone Encounter (Signed)
I called and spoke with pharmacist to [facility] [address] [place]. I called patient wife Mervyn GayLora apologized and advised it had been called in.

## 2016-01-02 NOTE — Telephone Encounter (Signed)
I called spoke with Tresa EndoKelly to give verbal order

## 2016-01-06 ENCOUNTER — Telehealth (INDEPENDENT_AMBULATORY_CARE_PROVIDER_SITE_OTHER): Payer: Self-pay | Admitting: Radiology

## 2016-01-06 ENCOUNTER — Other Ambulatory Visit (INDEPENDENT_AMBULATORY_CARE_PROVIDER_SITE_OTHER): Payer: Self-pay

## 2016-01-06 MED ORDER — COLLAGENASE 250 UNIT/GM EX OINT: 1.0000 "application " | TOPICAL_OINTMENT | Freq: Every day | CUTANEOUS | 0 refills | Status: AC

## 2016-01-06 NOTE — Telephone Encounter (Signed)
[facility] called and says the wound has a lot ot black eschar/slough, and she wants to know if a Rx for Santyl can be sent to pt's pharmacy?  Pls send Rx if ok.   No need to call her back.

## 2016-01-06 NOTE — Telephone Encounter (Signed)
This was entered into the chart and faxed to pharm verbal ok per Denny PeonErin

## 2016-01-21 ENCOUNTER — Ambulatory Visit (INDEPENDENT_AMBULATORY_CARE_PROVIDER_SITE_OTHER): Payer: Medicare Other | Admitting: Orthopedic Surgery

## 2016-01-21 DIAGNOSIS — Z89511 Acquired absence of right leg below knee: Secondary | ICD-10-CM

## 2016-01-21 MED ORDER — DOXYCYCLINE HYCLATE 100 MG PO TABS: 100.0000 mg | ORAL_TABLET | Freq: Two times a day (BID) | ORAL | 0 refills | Status: DC

## 2016-01-21 NOTE — Progress Notes (Signed)
Office Visit Note   Patient: Mark Bean           Date of Birth: 05/28/1945           MRN: [id number] Visit Date: 01/21/2016              [facility] [address]  [place], [place] [address] [facility]   Assessment & Plan: Visit Diagnoses:  1. S/P below knee amputation, right (HCC)     Plan: Continue home health nursing with Santyl dressing changes 3 times a week. The necrotic tissue was debrided from 3 wounds of skin soft tissue and necrotic tissue back to bleeding viable granulation tissue. Refill prescription for doxycycline.  Follow-Up Instructions: Return in about 3 weeks (around 02/11/2016).   Orders:  No orders of the defined types were placed in this encounter.  Meds ordered this encounter  Medications  . doxycycline (VIBRA-TABS) 100 MG tablet    Sig: Take 1 tablet (100 mg total) by mouth 2 (two) times daily.    Dispense:  28 tablet    Refill:  0      Procedures: No procedures performed   Clinical Data: No additional findings.   Subjective: Chief Complaint  Patient presents with  . Right Leg - Routine Post Op    HPI patient is about 6 weeks out has multiple areas of wound breakdown. There is a moderate amount of bloody drainage there is no cellulitis no temperature.  Review of Systems   Objective: Vital Signs: There were no vitals taken for this visit.  Physical Exam examination patient is alert oriented. The skin is doing much better. There is no skin maceration no cellulitis no purulent drainage.  Ortho Exam  Specialty Comments:  No specialty comments available.  Imaging: No results found.   PMFS History: Patient Active Problem List   Diagnosis Date Noted  . S/P below knee amputation, right (HCC) 12/03/2015  . 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 Subacute osteomyelitis, right ankle and foot (HCC)    Past Medical History:  Diagnosis Date  . Anxiety   . Arthritis     . Diabetes mellitus without complication (HCC)   . Hypertension    dr Shonna Chock[place]i    [id number]  . Kidney stones   . Myocardial infarction   . Pancreatitis, acute    caused by medicaion    No family history on file.  Past Surgical History:  Procedure Laterality Date  . AMPUTATION Right 10/11/2012   Procedure: 2nd ray amputation, medial column fusion and ostectomy right foot;  Surgeon: Nadara MustardMarcus V Kellyann Ordway, MD;  [facility];  Service: Orthopedics;  Laterality: Right;  . AMPUTATION Right 06/15/2013   Procedure: AMPUTATION DIGIT;  Surgeon: Nadara MustardMarcus Rose Hippler V, MD;  [facility]: [facility];  Service: Orthopedics;  Laterality: Right;  Right 3rd Toe Amputation  . AMPUTATION Right 12/03/2015   Procedure: AMPUTATION BELOW RIGHT KNEE;  Surgeon: Nadara MustardMarcus V Heaton Sarin, MD;  [facility]: [facility];  Service: Orthopedics;  Laterality: Right;  . ANKLE FUSION Right 10/11/2012   Procedure: ARTHRODESIS ANKLE;  Surgeon: Nadara MustardMarcus V Jolly Bleicher, MD;  [facility];  Service: Orthopedics;  Laterality: Right;  . APPLICATION OF WOUND VAC  12/03/2015  . CARDIAC CATHETERIZATION     2000  . COLONOSCOPY    . CORONARY ARTERY BYPASS GRAFT     2000   1 stent  . EYE SURGERY Right    cataract   .  HARDWARE REMOVAL Right 01/05/2013   Procedure: HARDWARE REMOVAL;  Surgeon: [name], MD;  Location: [facility] OR;  Service: Orthopedics;  Laterality: Right;  Right Foot Debridement, Placement antibiotic beads, remove screw  . I&D EXTREMITY Right 11/10/2012   Procedure: IRRIGATION AND DEBRIDEMENT EXTREMITY;  Surgeon: [name], MD;  [facility];  Service: Orthopedics;  Laterality: Right;  Irrigation and Debridement Right Foot Wound,  Antibiotic Beads and Wound VAC  . JOINT REPLACEMENT Left    knee  . REPLACEMENT TOTAL KNEE Right 02/15/2011  . stents    . TONSILLECTOMY    . VASECTOMY     Social History   Occupational History  . Not on file.   Social History Main Topics  . Smoking status: Never Smoker  . Smokeless tobacco: Never Used  . Alcohol use No      Comment: little  . Drug use: No  . Sexual activity: Not on file

## 2016-01-23 ENCOUNTER — Telehealth (INDEPENDENT_AMBULATORY_CARE_PROVIDER_SITE_OTHER): Payer: Self-pay | Admitting: Orthopedic Surgery

## 2016-01-23 NOTE — Telephone Encounter (Signed)
Patient's wife calling for Rx Oxycodone for R foot. Please call when ready.  He has only 2 left.

## 2016-01-26 ENCOUNTER — Other Ambulatory Visit (INDEPENDENT_AMBULATORY_CARE_PROVIDER_SITE_OTHER): Payer: Self-pay | Admitting: Orthopedic Surgery

## 2016-01-26 MED ORDER — OXYCODONE-ACETAMINOPHEN 5-325 MG PO TABS: 1.0000 | ORAL_TABLET | ORAL | 0 refills | Status: DC | PRN

## 2016-01-26 NOTE — Telephone Encounter (Signed)
Pt wife called about RX

## 2016-01-26 NOTE — Telephone Encounter (Signed)
rx written

## 2016-01-26 NOTE — Telephone Encounter (Signed)
I called and left voicemail advising rx is at front desk for pick up.

## 2016-01-28 ENCOUNTER — Telehealth (INDEPENDENT_AMBULATORY_CARE_PROVIDER_SITE_OTHER): Payer: Self-pay | Admitting: Radiology

## 2016-01-28 NOTE — Telephone Encounter (Signed)
Misty StanleyLisa, RN with [facility] called and states Mr. Mark Bean wound is draining a lot more, it is warm to touch and red. She is measuring it at 5.7cm deep and it is tunneling 3cm at 2 o'clock. I worked patient in for appt tomorrow. Patient aware.

## 2016-01-29 ENCOUNTER — Ambulatory Visit (INDEPENDENT_AMBULATORY_CARE_PROVIDER_SITE_OTHER): Payer: Medicare Other | Admitting: Orthopedic Surgery

## 2016-01-29 ENCOUNTER — Encounter (INDEPENDENT_AMBULATORY_CARE_PROVIDER_SITE_OTHER): Payer: Self-pay | Admitting: Family

## 2016-01-29 VITALS — Ht 68.0 in | Wt 237.0 lb

## 2016-01-29 DIAGNOSIS — Z89511 Acquired absence of right leg below knee: Secondary | ICD-10-CM

## 2016-01-29 DIAGNOSIS — L97211 Non-pressure chronic ulcer of right calf limited to breakdown of skin: Secondary | ICD-10-CM | POA: Insufficient documentation

## 2016-01-29 MED ORDER — SILVER SULFADIAZINE 1 % EX CREA: 1.0000 "application " | TOPICAL_CREAM | Freq: Every day | CUTANEOUS | 3 refills | Status: DC

## 2016-01-29 NOTE — Progress Notes (Signed)
Office Visit Note   Patient: Mark Bean           Date of Birth: 1945-09-02           MRN: [id number] Visit Date: 01/29/2016              Requested by: Janae Sauce [address]  [place], [place] [address] PCP: [facility]  Chief Complaint  Patient presents with  . Right Leg - Pain    12/03/15 right BKA    HPI: Patient has had increased drainage and had tunneling of the medial side of the limb. There is an odor and redness to the site. The pt states that he is taking Doxy bid and the wound is bing packed with gauze and covered with a dry dressing. There is some swelling to the limb. The patient has no complaints of pain. Nicole Hafley L Franci Oshana, RMA  Home health nursing changing the dressing 3 times a week.  Assessment & Plan: Visit Diagnoses:  1. S/P unilateral BKA (below knee amputation), right (HCC)   2. Non-pressure chronic ulcer of right calf, limited to breakdown of skin (HCC)     Plan: Plan to continue dressing changes continue with oral antibiotics there is good granulation tissue though the wound is quite deep we will see how he progresses in 2 weeks. Discussed the possible need for revision amputation.  Follow-Up Instructions: Return in about 2 weeks (around 02/12/2016).   Ortho Exam Examination the medial wound is 2 cm diameter 2 cm deep and has healthy granulation tissue the base there is no necrotic tissue no drainage no signs of infection. The lateral ulcers have much improved granulation tissue after using the Santyl we will now switch to Silvadene.  Imaging: No results found.  Orders:  No orders of the defined types were placed in this encounter.  Meds ordered this encounter  Medications  . silver sulfADIAZINE (SILVADENE) 1 % cream    Sig: Apply 1 application topically daily. Apply to affected area daily plus dry dressing    Dispense:  400 g    Refill:  3     Procedures: No procedures performed  Clinical Data: No additional  findings.  Subjective: Review of Systems  Objective: Vital Signs: Ht 5\' 8"  (1.727 m)   Wt 237 lb (107.5 kg)   BMI 36.04 kg/m   Specialty Comments:  No specialty comments available.  PMFS History: Patient Active Problem List   Diagnosis Date Noted  . Non-pressure chronic ulcer of right calf, limited to breakdown of skin (HCC) 01/29/2016  . S/P unilateral BKA (below knee amputation), right (HCC) 12/03/2015  . 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 Subacute osteomyelitis, right ankle and foot (HCC)    Past Medical History:  Diagnosis Date  . Anxiety   . Arthritis   . Diabetes mellitus without complication (HCC)   . Hypertension    dr Shonna Chock    [id number]  . Kidney stones   . Myocardial infarction   . Pancreatitis, acute    caused by medicaion    No family history on file.  Past Surgical History:  Procedure Laterality Date  . AMPUTATION Right 10/11/2012   Procedure: 2nd ray amputation, medial column fusion and ostectomy right foot;  Surgeon: [name], MD;  [facility];  Service: Orthopedics;  Laterality: Right;  . AMPUTATION Right 06/15/2013   Procedure: AMPUTATION DIGIT;  Surgeon: [name], MD;  Location: [facility]  OR;  Service: Orthopedics;  Laterality: Right;  Right 3rd Toe Amputation  . AMPUTATION Right 12/03/2015   Procedure: AMPUTATION BELOW RIGHT KNEE;  Surgeon: Nadara MustardMarcus V Duda, MD;  [facility];  Service: Orthopedics;  Laterality: Right;  . ANKLE FUSION Right 10/11/2012   Procedure: ARTHRODESIS ANKLE;  Surgeon: Nadara MustardMarcus V Duda, MD;  [facility];  Service: Orthopedics;  Laterality: Right;  . APPLICATION OF WOUND VAC  12/03/2015  . CARDIAC CATHETERIZATION     2000  . COLONOSCOPY    . CORONARY ARTERY BYPASS GRAFT     2000   1 stent  . EYE SURGERY Right    cataract   . HARDWARE REMOVAL Right 01/05/2013   Procedure: HARDWARE REMOVAL;  Surgeon: Nadara MustardMarcus V Duda, MD;  [facility];  Service: Orthopedics;  Laterality: Right;  Right Foot  Debridement, Placement antibiotic beads, remove screw  . I&D EXTREMITY Right 11/10/2012   Procedure: IRRIGATION AND DEBRIDEMENT EXTREMITY;  Surgeon: Nadara MustardMarcus V Duda, MD;  [facility];  Service: Orthopedics;  Laterality: Right;  Irrigation and Debridement Right Foot Wound,  Antibiotic Beads and Wound VAC  . JOINT REPLACEMENT Left    knee  . REPLACEMENT TOTAL KNEE Right 02/15/2011  . stents    . TONSILLECTOMY    . VASECTOMY     Social History   Occupational History  . Not on file.   Social History Main Topics  . Smoking status: Never Smoker  . Smokeless tobacco: Never Used  . Alcohol use No     Comment: little  . Drug use: No  . Sexual activity: Not on file

## 2016-01-30 ENCOUNTER — Telehealth (INDEPENDENT_AMBULATORY_CARE_PROVIDER_SITE_OTHER): Payer: Self-pay | Admitting: Orthopedic Surgery

## 2016-01-30 ENCOUNTER — Other Ambulatory Visit (INDEPENDENT_AMBULATORY_CARE_PROVIDER_SITE_OTHER): Payer: Self-pay | Admitting: Orthopedic Surgery

## 2016-01-30 NOTE — Telephone Encounter (Signed)
Mark Bean with [facility] calling for verbal orders of how to treat pt, he stated they don't have anything on pt yet.   [phone]

## 2016-02-02 NOTE — Telephone Encounter (Signed)
I advised that yes normally silvadene dressing needs to be changed daily. Family does not feel comfortable packing tunneling wound. Advised that than tid is fine, patient has been advised possibility of revision surgery. They are going to try to teach family and patient wound care if not ok per MD for TID dressing change.

## 2016-02-11 ENCOUNTER — Ambulatory Visit (INDEPENDENT_AMBULATORY_CARE_PROVIDER_SITE_OTHER): Payer: Medicare Other | Admitting: Orthopedic Surgery

## 2016-02-11 ENCOUNTER — Encounter (INDEPENDENT_AMBULATORY_CARE_PROVIDER_SITE_OTHER): Payer: Self-pay | Admitting: Orthopedic Surgery

## 2016-02-11 VITALS — Ht 68.0 in | Wt 237.0 lb

## 2016-02-11 DIAGNOSIS — L97211 Non-pressure chronic ulcer of right calf limited to breakdown of skin: Secondary | ICD-10-CM

## 2016-02-11 NOTE — Progress Notes (Signed)
Office Visit Note   Patient: Mark Bean           Date of Birth: 1945-02-23           MRN: [id number] Visit Date: 02/11/2016              Requested by: Janae Sauce [address]  [place], [place] [address] PCP: [facility]  Chief Complaint  Patient presents with  . Right Leg - Follow-up    Right below the knee amputation 12/03/15    HPI: [facility] comes to the home three times a week and packs open medial side of right below the knee amputation with silvadene. The pt does have a moderate amount of thick yellow drainage. There is depth to the wound. There is no redness and no odor. The pt is taking Doxycycline 100 mg BID. He does not have any questions or concerns today. Rodena Medin, RMA    Assessment & Plan: Visit Diagnoses:  1. Non-pressure chronic ulcer of right calf, limited to breakdown of skin (HCC)     Plan: Continue with wound care with Silvadene dressing changes. The 2 lateral wounds are almost completely healed the medial wound continues to show improvement. Discussed that if we get to the point where were not showing progressive improvement with the medial wound we will plan for revision of this area.  Follow-Up Instructions: Return in about 3 weeks (around 03/03/2016).   Ortho Exam Semination patient's leg has no cellulitis there is some clear drainage laterally the medial 2 wounds have 100% beefy granulation tissue and are flat. The lateral wound is 2 cm in diameter 1 cm deep after debridement there was good bleeding tissue.  Imaging: No results found.  Orders:  No orders of the defined types were placed in this encounter.  No orders of the defined types were placed in this encounter.    Procedures: No procedures performed  Clinical Data: No additional findings.  Subjective: Review of Systems  Objective: Vital Signs: Ht 5\' 8"  (1.727 m)   Wt 237 lb (107.5 kg)   BMI 36.04 kg/m   Specialty Comments:  No specialty  comments available.  PMFS History: Patient Active Problem List   Diagnosis Date Noted  . Non-pressure chronic ulcer of right calf, limited to breakdown of skin (HCC) 01/29/2016  . S/P unilateral BKA (below knee amputation), right (HCC) 12/03/2015  . 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 Subacute osteomyelitis, right ankle and foot (HCC)    Past Medical History:  Diagnosis Date  . Anxiety   . Arthritis   . Diabetes mellitus without complication (HCC)   . Hypertension    dr Shonna Chock    [id number]  . Kidney stones   . Myocardial infarction   . Pancreatitis, acute    caused by medicaion    No family history on file.  Past Surgical History:  Procedure Laterality Date  . AMPUTATION Right 10/11/2012   Procedure: 2nd ray amputation, medial column fusion and ostectomy right foot;  Surgeon: [name], MD;  [facility];  Service: Orthopedics;  Laterality: Right;  . AMPUTATION Right 06/15/2013   Procedure: AMPUTATION DIGIT;  Surgeon: [name], MD;  [facility];  Service: Orthopedics;  Laterality: Right;  Right 3rd Toe Amputation  . AMPUTATION Right 12/03/2015   Procedure: AMPUTATION BELOW RIGHT KNEE;  Surgeon: [name], MD;  [facility];  Service: Orthopedics;  Laterality: Right;  .  ANKLE FUSION Right 10/11/2012   Procedure: ARTHRODESIS ANKLE;  Surgeon: Nadara MustardMarcus V Cassell Voorhies, MD;  [facility];  Service: Orthopedics;  Laterality: Right;  . APPLICATION OF WOUND VAC  12/03/2015  . CARDIAC CATHETERIZATION     2000  . COLONOSCOPY    . CORONARY ARTERY BYPASS GRAFT     2000   1 stent  . EYE SURGERY Right    cataract   . HARDWARE REMOVAL Right 01/05/2013   Procedure: HARDWARE REMOVAL;  Surgeon: Nadara MustardMarcus V Vadim Centola, MD;  [facility];  Service: Orthopedics;  Laterality: Right;  Right Foot Debridement, Placement antibiotic beads, remove screw  . I&D EXTREMITY Right 11/10/2012   Procedure: IRRIGATION AND DEBRIDEMENT EXTREMITY;  Surgeon: Nadara MustardMarcus V Nelida Mandarino, MD;   [facility];  Service: Orthopedics;  Laterality: Right;  Irrigation and Debridement Right Foot Wound,  Antibiotic Beads and Wound VAC  . JOINT REPLACEMENT Left    knee  . REPLACEMENT TOTAL KNEE Right 02/15/2011  . stents    . TONSILLECTOMY    . VASECTOMY     Social History   Occupational History  . Not on file.   Social History Main Topics  . Smoking status: Never Smoker  . Smokeless tobacco: Never Used  . Alcohol use No     Comment: little  . Drug use: No  . Sexual activity: Not on file

## 2016-02-12 ENCOUNTER — Other Ambulatory Visit (INDEPENDENT_AMBULATORY_CARE_PROVIDER_SITE_OTHER): Payer: Self-pay | Admitting: Orthopedic Surgery

## 2016-02-25 ENCOUNTER — Other Ambulatory Visit (INDEPENDENT_AMBULATORY_CARE_PROVIDER_SITE_OTHER): Payer: Self-pay | Admitting: Family

## 2016-03-03 ENCOUNTER — Encounter (INDEPENDENT_AMBULATORY_CARE_PROVIDER_SITE_OTHER): Payer: Self-pay

## 2016-03-03 ENCOUNTER — Ambulatory Visit (INDEPENDENT_AMBULATORY_CARE_PROVIDER_SITE_OTHER): Payer: Medicare Other | Admitting: Orthopedic Surgery

## 2016-03-03 ENCOUNTER — Encounter (INDEPENDENT_AMBULATORY_CARE_PROVIDER_SITE_OTHER): Payer: Self-pay | Admitting: Orthopedic Surgery

## 2016-03-03 VITALS — Ht 68.0 in | Wt 237.0 lb

## 2016-03-03 DIAGNOSIS — Z89511 Acquired absence of right leg below knee: Secondary | ICD-10-CM

## 2016-03-03 NOTE — Progress Notes (Signed)
Office Visit Note   Patient: Mark Bean           Date of Birth: 10/06/1945           MRN: [id number] Visit Date: 03/03/2016              [facility] [address]  [place], [place] [address] [facility]  Chief Complaint  Patient presents with  . Right Knee - Follow-up    12/03/15(91d) RBKA      HPI: Patient is a 71 y.o male who presents for follow up of right transtibial amputation. He has a wound medially that [facility] home [facility] has been assisting with dressing every other day. There is decrease in depth. There is no redness or odor. Patient's lateral wounds are both healed. Patient is wearing shrinker. Patient is very pleased with his progress overall. He is on oral antibiotic of doxycycline. Donalee CitrinStepheney L Peele, RT    Assessment & Plan: Visit Diagnoses:  1. S/P unilateral BKA (below knee amputation), right (HCC)     Plan: Continue to pack the wound open with 2 x 2 gauze and Silvadene. Patient is given a medical compression stocking to wear on top of the packing. He is to wear this at all times. Patient will follow up with [facility] for prosthetic fitting  Follow-Up Instructions: Return in about 4 weeks (around 03/31/2016).   Ortho Exam Examination the ulcer has 100% beefy granulation tissue the ulcer is 10 mm in diameter 10 mm deep. This is healing quite nicely there is no cellulitis no drainage no odor  Imaging: No results found.  Orders:  No orders of the defined types were placed in this encounter.  No orders of the defined types were placed in this encounter.    Procedures: No procedures performed  Clinical Data: No additional findings.  Subjective: Review of Systems  Objective: Vital Signs: Ht 5\' 8"  (1.727 m)   Wt 237 lb (107.5 kg)   BMI 36.04 kg/m   Specialty Comments:  No specialty comments available.  PMFS History: Patient Active Problem List   Diagnosis Date Noted  . Non-pressure chronic ulcer of  right calf, limited to breakdown of skin (HCC) 01/29/2016  . S/P unilateral BKA (below knee amputation), right (HCC) 12/03/2015  . 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 Subacute osteomyelitis, right ankle and foot (HCC)    Past Medical History:  Diagnosis Date  . Anxiety   . Arthritis   . Diabetes mellitus without complication (HCC)   . Hypertension    dr Shonna Chock[place]    [id number]  . Kidney stones   . Myocardial infarction   . Pancreatitis, acute    caused by medicaion    History reviewed. No pertinent family history.  Past Surgical History:  Procedure Laterality Date  . AMPUTATION Right 10/11/2012   Procedure: 2nd ray amputation, medial column fusion and ostectomy right foot;  Surgeon: Nadara MustardMarcus V Duda, MD;  [facility];  Service: Orthopedics;  Laterality: Right;  . AMPUTATION Right 06/15/2013   Procedure: AMPUTATION DIGIT;  Surgeon: Nadara MustardMarcus Duda V, MD;  [facility];  Service: Orthopedics;  Laterality: Right;  Right 3rd Toe Amputation  . AMPUTATION Right 12/03/2015   Procedure: AMPUTATION BELOW RIGHT KNEE;  Surgeon: Nadara MustardMarcus V Duda, MD;  [facility];  Service: Orthopedics;  Laterality: Right;  . ANKLE FUSION Right 10/11/2012   Procedure: ARTHRODESIS ANKLE;  Surgeon: Nadara MustardMarcus V Duda, MD;  [facility];  Service: Orthopedics;  Laterality: Right;  . APPLICATION OF WOUND VAC  12/03/2015  . CARDIAC CATHETERIZATION     2000  . COLONOSCOPY    . CORONARY ARTERY BYPASS GRAFT     2000   1 stent  . EYE SURGERY Right    cataract   . HARDWARE REMOVAL Right 01/05/2013   Procedure: HARDWARE REMOVAL;  Surgeon: [name], MD;  Location: [facility] OR;  Service: Orthopedics;  Laterality: Right;  Right Foot Debridement, Placement antibiotic beads, remove screw  . I&D EXTREMITY Right 11/10/2012   Procedure: IRRIGATION AND DEBRIDEMENT EXTREMITY;  Surgeon: [name], MD;  [facility];  Service: Orthopedics;  Laterality: Right;  Irrigation and Debridement Right Foot Wound,   Antibiotic Beads and Wound VAC  . JOINT REPLACEMENT Left    knee  . REPLACEMENT TOTAL KNEE Right 02/15/2011  . stents    . TONSILLECTOMY    . VASECTOMY     Social History   Occupational History  . Not on file.   Social History Main Topics  . Smoking status: Never Smoker  . Smokeless tobacco: Never Used  . Alcohol use No     Comment: little  . Drug use: No  . Sexual activity: Not on file

## 2016-03-04 ENCOUNTER — Other Ambulatory Visit (INDEPENDENT_AMBULATORY_CARE_PROVIDER_SITE_OTHER): Payer: Self-pay | Admitting: Family

## 2016-03-10 ENCOUNTER — Telehealth (INDEPENDENT_AMBULATORY_CARE_PROVIDER_SITE_OTHER): Payer: Self-pay | Admitting: Orthopedic Surgery

## 2016-03-10 NOTE — Telephone Encounter (Signed)
Misty StanleyLisa (nurse) with [facility] called needing verbal orders to [facility] The number to contact her is [phone]

## 2016-03-10 NOTE — Telephone Encounter (Signed)
I called and left voicemail giving verbal ok for start of PT.

## 2016-03-17 ENCOUNTER — Other Ambulatory Visit (INDEPENDENT_AMBULATORY_CARE_PROVIDER_SITE_OTHER): Payer: Self-pay | Admitting: Orthopedic Surgery

## 2016-03-18 NOTE — Telephone Encounter (Signed)
Refill

## 2016-03-18 NOTE — Telephone Encounter (Signed)
Lets hold on antibiotic for now, no refill

## 2016-03-18 NOTE — Telephone Encounter (Signed)
No antibiotic refill at this time

## 2016-03-25 ENCOUNTER — Other Ambulatory Visit (INDEPENDENT_AMBULATORY_CARE_PROVIDER_SITE_OTHER): Payer: Self-pay | Admitting: Orthopedic Surgery

## 2016-03-31 ENCOUNTER — Encounter (INDEPENDENT_AMBULATORY_CARE_PROVIDER_SITE_OTHER): Payer: Self-pay | Admitting: Orthopedic Surgery

## 2016-03-31 ENCOUNTER — Ambulatory Visit (INDEPENDENT_AMBULATORY_CARE_PROVIDER_SITE_OTHER): Payer: Medicare Other | Admitting: Orthopedic Surgery

## 2016-03-31 VITALS — Ht 68.0 in | Wt 237.0 lb

## 2016-03-31 DIAGNOSIS — Z89511 Acquired absence of right leg below knee: Secondary | ICD-10-CM

## 2016-03-31 NOTE — Progress Notes (Signed)
Office Visit Note   Patient: Mark Bean           Date of Birth: 04/29/1945           MRN: [id number] Visit Date: 03/31/2016              [facility]: [facility] [address]  [place], [place] [id number] [facility]CP[facility]  Chief Complaint  Patient presents with  . Right Leg - Follow-up    12/03/15 right below the knee amputation 119 days out    HPI: Patient is 4 months out s/p a right below the knee amputation. Applying a small amount of silvadene to a medial side open area at the incision line and wearing a Vive shrinker.  P[place] [facility] [employer] [facility] [place]. Otherwise there are no questions or concerns today.  Patient states his amputation and has finally healed.    Assessment & Plan: Visit Diagnoses: No diagnosis found.  Plan: We will continue to follow with [employer] for fabrication of his prosthetic. [facility]. He will follow up with us in 3 months.  Follow-Up Instructions: No Follow-up on file.   Physical Exam  Constitutional: Appears well-developed.  Head: Normocephalic.  Eyes: EOM are normal.  Neck: Normal range of motion.  Cardiovascular: Normal rate.   Pulmonary/Chest: Effort normal.  Neurological: Is alert.  Skin: Skin is warm.  Psychiatric: Has a normal mood and affect. Right below the knee amputation is well-healed. This is well consolidated. There are no open areas noted drainage no redness no sign of infection Ortho Exam  Imaging: No results found.  Labs: Lab Results  Component Value Date   HGBA1C 9.2 (H) 12/03/2015   HGBA1C 7.2 (H) 11/10/2012   HGBA1C 9.2 (H) 02/18/2012    Orders:  No orders of the defined types were placed in this encounter.  No orders of the defined types were placed in this encounter.    Procedures: No procedures performed  Clinical Data: No additional findings.  Subjective: Review of Systems    Constitutional: Negative for chills and fever.  Musculoskeletal: Negative for myalgias.  Skin: Negative for color change and wound.    Objective: Vital Signs: Ht 5\' 8"  (1.727 m)   Wt 237 lb (107.5 kg)   BMI 36.04 kg/m   Specialty Comments:  No specialty comments available.  PMFS History: Patient Active Problem List   Diagnosis Date Noted  . Non-pressure chronic ulcer of right calf, limited to breakdown of skin (HCC) 01/29/2016  . S/P unilateral BKA (below knee amputation), right (HCC) 12/03/2015  . 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 Subacute osteomyelitis, right ankle and foot (HCC)    Past Medical History:  Diagnosis Date  . Anxiety   . Arthritis   . Diabetes mellitus without complication (HCC)   . Hypertension    dr [place]onna [place]    [id number]  . Kidney stones   . Myocardial infarction   . Pancreatitis, acute    caused by medicaion    No family history on file.  Past Surgical History:  Procedure Laterality Date  . AMPUTATION Right 10/11/2012   Procedure: 2nd ray amputation, medial column fusion and ostectomy right foot;  Surgeon: Nadara MustardMarcus V Duda, MD;  Location: [facility] OR;  Service: Orthopedics;  Laterality: Right;  . AMPUTATION Right 06/15/2013   Procedure: AMPUTATION DIGIT;  Surgeon: Aldean BakerMarcus Duda  V, MD;  Location: [facility] OR;  Service: Orthopedics;  Laterality: Right;  Right 3rd Toe Amputation  . AMPUTATION Right 12/03/2015   Procedure: AMPUTATION BELOW RIGHT KNEE;  Surgeon: [name], MD;  Location: [facility] OR;  Service: Orthopedics;  Laterality: Right;  . ANKLE FUSION Right 10/11/2012   Procedure: ARTHRODESIS ANKLE;  Surgeon: [name], MD;  [facility];  Service: Orthopedics;  Laterality: Right;  . APPLICATION OF WOUND VAC  12/03/2015  . CARDIAC CATHETERIZATION     2000  . COLONOSCOPY    . CORONARY ARTERY BYPASS GRAFT     2000   1 stent  . EYE SURGERY Right    cataract   . HARDWARE REMOVAL Right 01/05/2013   Procedure: HARDWARE REMOVAL;  Surgeon:  [name], MD;  Location: [facility] OR;  Service: Orthopedics;  Laterality: Right;  Right Foot Debridement, Placement antibiotic beads, remove screw  . I&D EXTREMITY Right 11/10/2012   Procedure: IRRIGATION AND DEBRIDEMENT EXTREMITY;  Surgeon: [name], MD;  Location: [facility] OR;  Service: Orthopedics;  Laterality: Right;  Irrigation and Debridement Right Foot Wound,  Antibiotic Beads and Wound VAC  . JOINT REPLACEMENT Left    knee  . REPLACEMENT TOTAL KNEE Right 02/15/2011  . stents    . TONSILLECTOMY    . VASECTOMY     Social History   Occupational History  . Not on file.   Social History Main Topics  . Smoking status: Never Smoker  . Smokeless tobacco: Never Used  . Alcohol use No     Comment: little  . Drug use: No  . Sexual activity: Not on file

## 2016-04-02 ENCOUNTER — Telehealth (INDEPENDENT_AMBULATORY_CARE_PROVIDER_SITE_OTHER): Payer: Self-pay | Admitting: Orthopedic Surgery

## 2016-04-02 NOTE — Telephone Encounter (Signed)
Copy of 12/03/2015 OP Note mailed to patient per his request.

## 2016-04-26 ENCOUNTER — Other Ambulatory Visit (INDEPENDENT_AMBULATORY_CARE_PROVIDER_SITE_OTHER): Payer: Self-pay | Admitting: Family

## 2016-06-23 ENCOUNTER — Other Ambulatory Visit (INDEPENDENT_AMBULATORY_CARE_PROVIDER_SITE_OTHER): Payer: Self-pay | Admitting: Family

## 2016-06-23 ENCOUNTER — Ambulatory Visit (INDEPENDENT_AMBULATORY_CARE_PROVIDER_SITE_OTHER): Payer: Medicare Other | Admitting: Family

## 2016-06-23 DIAGNOSIS — Z89511 Acquired absence of right leg below knee: Secondary | ICD-10-CM

## 2016-06-23 NOTE — Progress Notes (Signed)
Office Visit Note   Patient: Mark Bean           Date of Birth: [birth year]           MRN: [id number] Visit Date: 06/23/2016              [facility]ed by: [facility] [address] [place], [place] [address] PCP: [facility]  No chief complaint on file.     HPI: The patient is 71 year old gentleman who presents today for concern of a blister to his stump. He status post right below the knee amputation last year. He has been up and his prosthetic however he does state he is only been able to ambulate for about an hour at a time each day.   Assessment & Plan: Visit Diagnoses:  1. S/P unilateral BKA (below knee amputation), right (HCC)     Plan: We will apply Neosporin and a Band-Aid until this heals. Recommended staying of this prosthesis until his ulcer has healed. If this reoccurs recommended he return to his prosthetists for modifications to his prosthetic.  Follow-Up Instructions: Return if symptoms worsen or fail to improve.   Ortho Exam  Patient is alert, oriented, no adenopathy, well-dressed, normal affect, normal respiratory effort. Right residual limb is well consolidated. There is a central ulcer from an bearing this is 1 centimeter by 3 mm there is bleeding granulation tissue in the wound bed. This is superficial. There is no drainage no odor no surrounding erythema or sign of infection  Imaging: No results found.  Labs: Lab Results  Component Value Date   HGBA1C 9.2 (H) 12/03/2015   HGBA1C 7.2 (H) 11/10/2012   HGBA1C 9.2 (H) 02/18/2012    Orders:  No orders of the defined types were placed in this encounter.  No orders of the defined types were placed in this encounter.    Procedures: No procedures performed  Clinical Data: No additional findings.  ROS:  All other systems negative, except as noted in the HPI. Review of Systems  Constitutional: Negative for chills and fever.  Cardiovascular: Negative for leg swelling.  Skin:  Positive for wound. Negative for color change and rash.    Objective: Vital Signs: There were no vitals taken for this visit.  Specialty Comments:  No specialty comments available.  PMFS History: Patient Active Problem List   Diagnosis Date Noted  . Non-pressure chronic ulcer of right calf, limited to breakdown of skin (HCC) 01/29/2016  . S/P unilateral BKA (below knee amputation), right (HCC) 12/03/2015  . 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 Subacute osteomyelitis, right ankle and foot (HCC)    Past Medical History:  Diagnosis Date  . Anxiety   . Arthritis   . Diabetes mellitus without complication (HCC)   . Hypertension    dr Shonna Chock    [id number]  . Kidney stones   . Myocardial infarction   . Pancreatitis, acute    caused by medicaion    No family history on file.  Past Surgical History:  Procedure Laterality Date  . AMPUTATION Right 10/11/2012   Procedure: 2nd ray amputation, medial column fusion and ostectomy right foot;  Surgeon: [name], MD;  [facility]: [facility];  Service: Orthopedics;  Laterality: Right;  . AMPUTATION Right 06/15/2013   Procedure: AMPUTATION DIGIT;  Surgeon: [name], MD;  Location: [facility] OR;  Service: Orthopedics;  Laterality: Right;  Right 3rd Toe Amputation  . AMPUTATION Right 12/03/2015  Procedure: AMPUTATION BELOW RIGHT KNEE;  Surgeon: Nadara MustardMarcus V Duda, MD;  Location: [facility] OR;  Service: Orthopedics;  Laterality: Right;  . ANKLE FUSION Right 10/11/2012   Procedure: ARTHRODESIS ANKLE;  Surgeon: Nadara MustardMarcus V Duda, MD;  [facility];  Service: Orthopedics;  Laterality: Right;  . APPLICATION OF WOUND VAC  12/03/2015  . CARDIAC CATHETERIZATION     2000  . COLONOSCOPY    . CORONARY ARTERY BYPASS GRAFT     2000   1 stent  . EYE SURGERY Right    cataract   . HARDWARE REMOVAL Right 01/05/2013   Procedure: HARDWARE REMOVAL;  Surgeon: Nadara MustardMarcus V Duda, MD;  [facility];  Service: Orthopedics;  Laterality: Right;  Right Foot  Debridement, Placement antibiotic beads, remove screw  . I&D EXTREMITY Right 11/10/2012   Procedure: IRRIGATION AND DEBRIDEMENT EXTREMITY;  Surgeon: Nadara MustardMarcus V Duda, MD;  Location: [facility] OR;  Service: Orthopedics;  Laterality: Right;  Irrigation and Debridement Right Foot Wound,  Antibiotic Beads and Wound VAC  . JOINT REPLACEMENT Left    knee  . REPLACEMENT TOTAL KNEE Right 02/15/2011  . stents    . TONSILLECTOMY    . VASECTOMY     Social History   Occupational History  . Not on file.   Social History Main Topics  . Smoking status: Never Smoker  . Smokeless tobacco: Never Used  . Alcohol use No     Comment: little  . Drug use: No  . Sexual activity: Not on file

## 2016-06-28 ENCOUNTER — Ambulatory Visit (INDEPENDENT_AMBULATORY_CARE_PROVIDER_SITE_OTHER): Payer: Medicare Other | Admitting: Family

## 2016-06-28 ENCOUNTER — Ambulatory Visit (INDEPENDENT_AMBULATORY_CARE_PROVIDER_SITE_OTHER): Payer: Medicare Other | Admitting: Orthopedic Surgery

## 2016-07-14 ENCOUNTER — Telehealth (INDEPENDENT_AMBULATORY_CARE_PROVIDER_SITE_OTHER): Payer: Self-pay | Admitting: Orthopedic Surgery

## 2016-07-14 NOTE — Telephone Encounter (Signed)
IC patient, [facility]. [facility]

## 2016-07-22 ENCOUNTER — Other Ambulatory Visit: Payer: Self-pay

## 2016-07-22 ENCOUNTER — Emergency Department
Admission: EM | Admit: 2016-07-22 | Discharge: 2016-07-23 | Disposition: A | Discharge status: Home / Self Care | Payer: Medicare Other | Attending: Emergency Medicine | Admitting: Emergency Medicine

## 2016-07-22 DIAGNOSIS — Z794 Long term (current) use of insulin: Secondary | ICD-10-CM | POA: Insufficient documentation

## 2016-07-22 DIAGNOSIS — Z7982 Long term (current) use of aspirin: Secondary | ICD-10-CM | POA: Insufficient documentation

## 2016-07-22 DIAGNOSIS — N200 Calculus of kidney: Secondary | ICD-10-CM | POA: Diagnosis not present

## 2016-07-22 DIAGNOSIS — I10 Essential (primary) hypertension: Secondary | ICD-10-CM | POA: Insufficient documentation

## 2016-07-22 DIAGNOSIS — E1165 Type 2 diabetes mellitus with hyperglycemia: Secondary | ICD-10-CM | POA: Diagnosis not present

## 2016-07-22 DIAGNOSIS — R1031 Right lower quadrant pain: Secondary | ICD-10-CM | POA: Diagnosis present

## 2016-07-22 DIAGNOSIS — Z7984 Long term (current) use of oral hypoglycemic drugs: Secondary | ICD-10-CM | POA: Diagnosis not present

## 2016-07-22 DIAGNOSIS — Z79899 Other long term (current) drug therapy: Secondary | ICD-10-CM | POA: Insufficient documentation

## 2016-07-22 DIAGNOSIS — R739 Hyperglycemia, unspecified: Secondary | ICD-10-CM

## 2016-07-22 LAB — COMPREHENSIVE METABOLIC PANEL
ALT: 17 U/L (ref 17–63)
AST: 19 U/L (ref 15–41)
Albumin: 3.8 g/dL (ref 3.5–5.0)
Alkaline Phosphatase: 66 U/L (ref 38–126)
Anion gap: 10 (ref 5–15)
BILIRUBIN TOTAL: 0.6 mg/dL (ref 0.3–1.2)
BUN: 27 mg/dL — AB (ref 6–20)
CO2: 28 mmol/L (ref 22–32)
Calcium: 9.8 mg/dL (ref 8.9–10.3)
Chloride: 100 mmol/L — ABNORMAL LOW (ref 101–111)
Creatinine, Ser: 1.29 mg/dL — ABNORMAL HIGH (ref 0.61–1.24)
GFR calc Af Amer: >60 mL/min (ref >60)
GFR, EST NON AFRICAN AMERICAN: 54 mL/min — AB (ref >60)
Glucose, Bld: 364 mg/dL — ABNORMAL HIGH (ref 65–99)
POTASSIUM: 4.1 mmol/L (ref 3.5–5.1)
Sodium: 138 mmol/L (ref 135–145)
TOTAL PROTEIN: 7.1 g/dL (ref 6.5–8.1)

## 2016-07-22 LAB — CBC
HEMATOCRIT: 37.9 % — AB (ref 40.0–52.0)
Hemoglobin: 12.8 g/dL — ABNORMAL LOW (ref 13.0–18.0)
MCH: 27.3 pg (ref 26.0–34.0)
MCHC: 33.7 g/dL (ref 32.0–36.0)
MCV: 80.9 fL (ref 80.0–100.0)
PLATELETS: 192 10*3/uL (ref 150–440)
RBC: 4.68 MIL/uL (ref 4.40–5.90)
RDW: 14.4 % (ref 11.5–14.5)
WBC: 8 10*3/uL (ref 3.8–10.6)

## 2016-07-22 LAB — TROPONIN I: Troponin I: <0.03 ng/mL (ref <0.03)

## 2016-07-22 LAB — LIPASE, BLOOD: Lipase: 21 U/L (ref 11–51)

## 2016-07-22 MED ORDER — MORPHINE SULFATE (PF) 4 MG/ML IV SOLN
4.0000 mg | Freq: Once | INTRAVENOUS | Status: AC
  Administered 2016-07-22: 4 mg via INTRAVENOUS
  Filled 2016-07-22: qty 1

## 2016-07-22 MED ORDER — ONDANSETRON 4 MG PO TBDP
4.0000 mg | ORAL_TABLET | Freq: Once | ORAL | Status: AC | PRN
  Administered 2016-07-22: 4 mg via ORAL
  Filled 2016-07-22: qty 1

## 2016-07-22 NOTE — ED Triage Notes (Signed)
Pt reports having generalized trunk pain from chest to lower abdomen.  Pt reports having nausea without vomiting and diarrhea.  Pt states that his back also hurts and that he has done nothing, but lay around all day.  Pt states that this is the worst pain that he has ever been in.  Pt fidgeting and unable to sit still in triage.

## 2016-07-22 NOTE — ED Provider Notes (Signed)
[facility] Emergency Department Provider Note   ____________________________________________   First MD Initiated Contact with Patient 07/22/16 2315     (approximate)  I have reviewed the triage vital signs and the nursing notes.   HISTORY  Chief Complaint Abdominal Pain; Chest Pain; and Back Pain    HPI Mark Bean is a 71 y.o. male who presents to the ED from home with a chief complaint of right flank pain. History of kidney stones and states this feels similarly. Reports sudden onset right flank pain, nonradiating, associated with nausea only. Denies associatedfever, chills, chest pain, shortness of breath, urinary symptoms, testicular pain or swelling. Denies recent travel or trauma. Patient received morphine prior to my arrival and is resting more comfortably.   Past Medical History:  Diagnosis Date  . Anxiety   . Arthritis   . Diabetes mellitus without complication (HCC)   . Hypertension    dr Shonna Chock    [id number]  . Kidney stones   . Myocardial infarction (HCC)   . Pancreatitis, acute    caused by medicaion    Patient Active Problem List   Diagnosis Date Noted  . Non-pressure chronic ulcer of right calf, limited to breakdown of skin (HCC) 01/29/2016  . S/P unilateral BKA (below knee amputation), right (HCC) 12/03/2015  . 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 Subacute osteomyelitis, right ankle and foot [facility])     Past Surgical History:  Procedure Laterality Date  . AMPUTATION Right 10/11/2012   Procedure: 2nd ray amputation, medial column fusion and ostectomy right foot;  Surgeon: [name], MD;  Location: [facility] OR;  Service: Orthopedics;  Laterality: Right;  . AMPUTATION Right 06/15/2013   Procedure: AMPUTATION DIGIT;  Surgeon: [name], MD;  Location: [facility] OR;  Service: Orthopedics;  Laterality: Right;  Right 3rd Toe Amputation  . AMPUTATION Right 12/03/2015   Procedure: AMPUTATION BELOW RIGHT KNEE;   Surgeon: [name], MD;  Location: [facility] OR;  Service: Orthopedics;  Laterality: Right;  . ANKLE FUSION Right 10/11/2012   Procedure: ARTHRODESIS ANKLE;  Surgeon: [name], MD;  Location: [facility] OR;  Service: Orthopedics;  Laterality: Right;  . APPLICATION OF WOUND VAC  12/03/2015  . CARDIAC CATHETERIZATION     2000  . COLONOSCOPY    . CORONARY ARTERY BYPASS GRAFT     2000   1 stent  . EYE SURGERY Right    cataract   . HARDWARE REMOVAL Right 01/05/2013   Procedure: HARDWARE REMOVAL;  Surgeon: [name], MD;  Location: [facility] OR;  Service: Orthopedics;  Laterality: Right;  Right Foot Debridement, Placement antibiotic beads, remove screw  . I&D EXTREMITY Right 11/10/2012   Procedure: IRRIGATION AND DEBRIDEMENT EXTREMITY;  Surgeon: [name], MD;  Location: [facility] OR;  Service: Orthopedics;  Laterality: Right;  Irrigation and Debridement Right Foot Wound,  Antibiotic Beads and Wound VAC  . JOINT REPLACEMENT Left    knee  . REPLACEMENT TOTAL KNEE Right 02/15/2011  . stents    . TONSILLECTOMY    . VASECTOMY      Prior to Admission medications   Medication Sig Start Date End Date Taking? Authorizing Provider  aspirin 325 MG tablet Take 325 mg by mouth at bedtime.    [provider]  atorvastatin (LIPITOR) 80 MG tablet Take 80 mg by mouth daily at 6 PM.    [provider]  carvedilol (COREG) 12.5 MG tablet Take 12.5 mg by mouth  2 (two) times daily with a meal.    [provider]  chlorthalidone (HYGROTON) 50 MG tablet Take 25 mg by mouth daily.     [provider]  cholecalciferol (VITAMIN D) 1000 units tablet Take 1,000 Units by mouth daily.    [provider]  ciprofloxacin (CIPRO) 500 MG tablet Take 1 tablet (500 mg total) by mouth 2 (two) times daily. 07/23/16   Irean Hong, MD  citalopram (CELEXA) 20 MG tablet Take 20 mg by mouth daily.    [provider]  cyclobenzaprine (FLEXERIL) 5 MG tablet Take 1 tablet (5 mg total) by mouth  every 8 (eight) hours as needed for muscle spasms. 12/09/14   Beers, Charmayne Sheer, PA-C  doxycycline (VIBRA-TABS) 100 MG tablet TAKE 1 TABLET BY MOUTH TWICE A DAY 03/05/16   [name], MD  doxycycline (VIBRAMYCIN) 100 MG capsule Take 100 mg by mouth 2 (two) times daily. 01/01/16   [provider]  gabapentin (NEURONTIN) 100 MG capsule TAKE 1 CAPSULE (100 MG TOTAL) BY MOUTH 3 (THREE) TIMES DAILY. 06/23/16   [name], MD  insulin aspart (NOVOLOG) 100 UNIT/ML injection Inject 25 Units into the skin 3 (three) times daily with meals.     [provider]  insulin glargine (LANTUS) 100 UNIT/ML injection Inject 20 Units into the skin 2 (two) times daily.     [provider]  lisinopril (PRINIVIL,ZESTRIL) 40 MG tablet Take 40 mg by mouth daily.    [provider]  loperamide (IMODIUM A-D) 2 MG tablet Take 1 tablet (2 mg total) by mouth 4 (four) times daily as needed for diarrhea or loose stools. 07/04/15   Minna Antis, MD  metFORMIN (GLUCOPHAGE) 1000 MG tablet Take 1,000 mg by mouth 2 (two) times daily with a meal.    [provider]  Omega-3 Fatty Acids (FISH OIL) 1200 MG CAPS Take 1,200 mg by mouth daily.    [provider]  ondansetron (ZOFRAN ODT) 4 MG disintegrating tablet Take 1 tablet (4 mg total) by mouth every 8 (eight) hours as needed for nausea or vomiting. 07/23/16   Irean Hong, MD  oxyCODONE-acetaminophen (ROXICET) 5-325 MG tablet Take 1 tablet by mouth every 4 (four) hours as needed for severe pain. 07/23/16   Irean Hong, MD  silver sulfADIAZINE (SILVADENE) 1 % cream Apply 1 application topically daily. Apply to affected area daily plus dry dressing 01/29/16   [name], MD  tamsulosin (FLOMAX) 0.4 MG CAPS capsule Take 1 capsule (0.4 mg total) by mouth daily. 07/23/16   Irean Hong, MD    Allergies Patient has no known allergies.  No family history on file.  Social History Social History  Substance Use Topics  . Smoking  status: Never Smoker  . Smokeless tobacco: Never Used  . Alcohol use No     Comment: little    Review of Systems  Constitutional: No fever/chills. Eyes: No visual changes. ENT: No sore throat. Cardiovascular: Denies chest pain. Respiratory: Denies shortness of breath. Gastrointestinal: Positive for right flank pain. No abdominal pain.  Positive for nausea, no vomiting.  No diarrhea.  No constipation. Genitourinary: Negative for dysuria. Musculoskeletal: Negative for back pain. Skin: Negative for rash. Neurological: Negative for headaches, focal weakness or numbness.   ____________________________________________   PHYSICAL EXAM:  VITAL SIGNS: ED Triage Vitals  Enc Vitals Group     BP 07/22/16 2222 (!) 178/81     Pulse Rate 07/22/16 2222 73  Resp 07/22/16 2222 (!) 26     Temp 07/22/16 2222 98.7 F (37.1 C)     Temp Source 07/22/16 2222 Oral     SpO2 07/22/16 2222 98 %     Weight 07/22/16 2223 209 lb (94.8 kg)     Height 07/22/16 2223 5\' 8"  (1.727 m)     Head Circumference --      Peak Flow --      Pain Score 07/22/16 2222 10     Pain Loc --      Pain Edu? --      Excl. in GC? --     Constitutional: Alert and oriented. Well appearing and in no acute distress. Eyes: Conjunctivae are normal. PERRL. EOMI. Head: Atraumatic. Nose: No congestion/rhinnorhea. Mouth/Throat: Mucous membranes are moist.  Oropharynx non-erythematous. Neck: No stridor.   Cardiovascular: Normal rate, regular rhythm. Grossly normal heart sounds.  Good peripheral circulation. Respiratory: Normal respiratory effort.  No retractions. Lungs CTAB. Gastrointestinal: Soft and nontender to light or deep palpation. No distention. No abdominal bruits. No CVA tenderness. Musculoskeletal: Right BKA. Neurologic:  Normal speech and language. No gross focal neurologic deficits are appreciated.  Skin:  Skin is warm, dry and intact. No rash noted. Psychiatric: Mood and affect are normal. Speech and behavior  are normal.  ____________________________________________   LABS (all labs ordered are listed, but only abnormal results are displayed)  Labs Reviewed  COMPREHENSIVE METABOLIC PANEL - Abnormal; Notable for the following:       Result Value   Chloride 100 (*)    Glucose, Bld 364 (*)    BUN 27 (*)    Creatinine, Ser 1.29 (*)    GFR calc non Af Amer 54 (*)    All other components within normal limits  CBC - Abnormal; Notable for the following:    Hemoglobin 12.8 (*)    HCT 37.9 (*)    All other components within normal limits  URINALYSIS, COMPLETE (UACMP) WITH MICROSCOPIC - Abnormal; Notable for the following:    Color, Urine YELLOW (*)    APPearance CLEAR (*)    Glucose, UA >=500 (*)    Hgb urine dipstick LARGE (*)    Protein, ur 30 (*)    Squamous Epithelial / LPF 0-5 (*)    All other components within normal limits  GLUCOSE, CAPILLARY - Abnormal; Notable for the following:    Glucose-Capillary 311 (*)    All other components within normal limits  URINE CULTURE  LIPASE, BLOOD  TROPONIN I   ____________________________________________  EKG  ED ECG REPORT I, Blue Winther J, the attending physician, personally viewed and interpreted this ECG.   Date: 07/23/2016  EKG Time: 2226  Rate: 73  Rhythm: normal EKG, normal sinus rhythm  Axis: Normal  Intervals: IVCD  ST&T Change: Nonspecific  ____________________________________________  RADIOLOGY  Ct Renal Stone Study  Result Date: 07/23/2016 CLINICAL DATA:  Right flank pain.  History of stones. EXAM: CT ABDOMEN AND PELVIS WITHOUT CONTRAST TECHNIQUE: Multidetector CT imaging of the abdomen and pelvis was performed following the standard protocol without IV contrast. COMPARISON:  CT 06/08/2013 FINDINGS: Lower chest: Multifocal atelectasis at the lung bases. No pleural fluid. Coronary artery calcifications are seen. Hepatobiliary: Partially exophytic partially calcified 11 mm lesion from a right hepatic lobe, unchanged from  prior exam. No new hepatic lesion allowing for lack contrast. Calcified gallstone within physiologically distended gallbladder. No biliary dilatation. No pericholecystic inflammation. Pancreas: No ductal dilatation or inflammation. Spleen: Elongated measuring 16.8 cm AP. Volume  of 440 cm^3 (dimensions 16.8 x 9.5 x 5.3cm), no significant change from prior exam. Adrenals/Urinary Tract: Obstructing 3 x 4 mm stone at the right ureterovesicular junction with moderate proximal hydroureteronephrosis. Mild to moderate right perinephric edema. Multiple, at least 4, nonobstructing stones in the right kidney. No left hydronephrosis. There multiple, at least 3, nonobstructing stones in the left kidney. Mild nonspecific left perinephric edema. Left ureter is decompressed. Urinary bladder is nondistended, however there is diffuse bladder wall thickening. Mild thickening of the left adrenal gland. No discrete adrenal nodule. Stomach/Bowel: Stomach distended with ingested contents. No bowel inflammation, obstruction or wall thickening. Normal appendix. Moderate colonic stool burden. Diverticulosis of the descending and sigmoid colon without acute diverticulitis. Vascular/Lymphatic: Mild aortic atherosclerosis. Right external iliac stent. Small retroperitoneal nodes are likely reactive. Reproductive: Prominent sized prostate gland spanning 5.4 cm. Enlarged seminal vesicles are again seen, unchanged from prior exam. Other: No ascites or free air. Fat in the left inguinal canal. Soft tissue densities in the anterior abdominal wall likely secondary to medication injections. Musculoskeletal: There are no acute or suspicious osseous abnormalities. Degenerative change in the spine with primarily facet arthropathy. IMPRESSION: 1. Obstructing 3 x 4 mm stone at the right ureterovesicular junction with moderate hydronephrosis. 2. Bilateral nonobstructing renal calculi. 3. Bladder wall thickening, thicken despite the degree of nondistention.  Recommend correlation with urinalysis to exclude urinary tract infection. 4. Chronic findings include partially calcified exophytic right lobe liver lesion, splenomegaly, and colonic diverticulosis. 5. Aortic atherosclerosis. Electronically Signed   By: Rubye Oaks M.D.   On: 07/23/2016 01:33    ____________________________________________   PROCEDURES  Procedure(s) performed: None  Procedures  Critical Care performed: No  ____________________________________________   INITIAL IMPRESSION / ASSESSMENT AND PLAN / ED COURSE  Pertinent labs & imaging results that were available during my care of the patient were reviewed by me and considered in my medical decision making (see chart for details).  71 year old male who presents with right flank pain, history of kidney stones. Resting more comfortably after initial administration of morphine. Initial laboratory results remarkable for hyperglycemia and mild elevation of renal function. Will re-dose analgesia, obtain CT renal colic study, initiate IV fluid resuscitation and reassess.  Clinical Course as of Jul 24 646  Fri Jul 23, 2016  0213 Patient resting in no acute distress. Updated patient and spouse of CT imaging results. Voices no complaints at this time. IV fluids infusing. Urine sent for analysis.  [JS]  [id number] Updated patient and spouse on urinalysis results. Repeat blood sugar trending down; now 311. Urine culture is pending. There are no nitrites or leukocytes on urinalysis, and WBCs are most likely secondary to large amount of RBCs. However, will empirically treat with Cipro until urine culture comes back. Patient is a [place] patient will follow-up with the [place]. I have also given him contact information for local urology follow-up. Strict return precautions given. Both verbalize understanding and agree with plan of care.  [JS]    Clinical Course User Index [JS] Irean Hong, MD      ____________________________________________   FINAL CLINICAL IMPRESSION(S) / ED DIAGNOSES  Final diagnoses:  Kidney stone  Hyperglycemia      NEW MEDICATIONS STARTED DURING THIS VISIT:  Discharge Medication List as of 07/23/2016  2:56 AM    START taking these medications   Details  ciprofloxacin (CIPRO) 500 MG tablet Take 1 tablet (500 mg total) by mouth 2 (two) times daily., Starting Fri 07/23/2016, Print    ondansetron [facility]  ODT) 4 MG disintegrating tablet Take 1 tablet (4 mg total) by mouth every 8 (eight) hours as needed for nausea or vomiting., Starting Fri 07/23/2016, Print    tamsulosin (FLOMAX) 0.4 MG CAPS capsule Take 1 capsule (0.4 mg total) by mouth daily., Starting Fri 07/23/2016, Print         Note:  This document was prepared using Dragon voice recognition software and may include unintentional dictation errors.    Irean HongSung, Harden Bramer J, MD 07/23/16 [phone]

## 2016-07-23 ENCOUNTER — Emergency Department: Payer: Medicare Other

## 2016-07-23 LAB — URINALYSIS, COMPLETE (UACMP) WITH MICROSCOPIC
BACTERIA UA: NONE SEEN
Bilirubin Urine: NEGATIVE
Ketones, ur: NEGATIVE mg/dL
Leukocytes, UA: NEGATIVE
NITRITE: NEGATIVE
PROTEIN: 30 mg/dL — AB
SPECIFIC GRAVITY, URINE: 1.015 (ref 1.005–1.030)
pH: 5 (ref 5.0–8.0)

## 2016-07-23 LAB — GLUCOSE, CAPILLARY: GLUCOSE-CAPILLARY: 311 mg/dL — AB (ref 65–99)

## 2016-07-23 MED ORDER — OXYCODONE-ACETAMINOPHEN 5-325 MG PO TABS: 1.0000 | ORAL_TABLET | ORAL | 0 refills | Status: DC | PRN

## 2016-07-23 MED ORDER — MORPHINE SULFATE (PF) 4 MG/ML IV SOLN
4.0000 mg | Freq: Once | INTRAVENOUS | Status: AC
  Administered 2016-07-23: 4 mg via INTRAVENOUS
  Filled 2016-07-23: qty 1

## 2016-07-23 MED ORDER — SODIUM CHLORIDE 0.9 % IV BOLUS (SEPSIS)
1000.0000 mL | Freq: Once | INTRAVENOUS | Status: AC
  Administered 2016-07-23: 1000 mL via INTRAVENOUS

## 2016-07-23 MED ORDER — TAMSULOSIN HCL 0.4 MG PO CAPS: 0.4000 mg | ORAL_CAPSULE | Freq: Every day | ORAL | 0 refills | Status: DC

## 2016-07-23 MED ORDER — ONDANSETRON 4 MG PO TBDP: 4.0000 mg | ORAL_TABLET | Freq: Three times a day (TID) | ORAL | 0 refills | Status: DC | PRN

## 2016-07-23 MED ORDER — ONDANSETRON HCL 4 MG/2ML IJ SOLN
4.0000 mg | Freq: Once | INTRAMUSCULAR | Status: AC
  Administered 2016-07-23: 4 mg via INTRAVENOUS
  Filled 2016-07-23: qty 2

## 2016-07-23 MED ORDER — CIPROFLOXACIN HCL 500 MG PO TABS: 500.0000 mg | ORAL_TABLET | Freq: Two times a day (BID) | ORAL | 0 refills | Status: DC

## 2016-07-23 MED ORDER — CIPROFLOXACIN HCL 500 MG PO TABS
500.0000 mg | ORAL_TABLET | Freq: Once | ORAL | Status: AC
  Administered 2016-07-23: 500 mg via ORAL
  Filled 2016-07-23: qty 1

## 2016-07-23 NOTE — Discharge Instructions (Signed)
1. Take pain & nausea medicines as needed (Percocet/Zofran #30). Make sure to take a stool softener while taking narcotic pain medicines. 2. Take Flomax 0.4mg daily x 14 days. 3. Drink plenty of bottled or filtered water daily. 4. Return to the ER for worsening symptoms, persistent vomiting, fever, difficulty breathing or other concerns.  

## 2016-07-23 NOTE — ED Notes (Signed)
Patient transported to CT 

## 2016-07-23 NOTE — ED Notes (Signed)
Patient returned from CT

## 2016-07-24 LAB — URINE CULTURE: Culture: NO GROWTH

## 2016-07-25 ENCOUNTER — Emergency Department
Admission: EM | Admit: 2016-07-25 | Discharge: 2016-07-25 | Disposition: A | Discharge status: Home / Self Care | Payer: Medicare Other | Attending: Emergency Medicine | Admitting: Emergency Medicine

## 2016-07-25 DIAGNOSIS — Z7984 Long term (current) use of oral hypoglycemic drugs: Secondary | ICD-10-CM | POA: Insufficient documentation

## 2016-07-25 DIAGNOSIS — Z794 Long term (current) use of insulin: Secondary | ICD-10-CM | POA: Diagnosis not present

## 2016-07-25 DIAGNOSIS — Z7982 Long term (current) use of aspirin: Secondary | ICD-10-CM | POA: Diagnosis not present

## 2016-07-25 DIAGNOSIS — I252 Old myocardial infarction: Secondary | ICD-10-CM | POA: Diagnosis not present

## 2016-07-25 DIAGNOSIS — I10 Essential (primary) hypertension: Secondary | ICD-10-CM | POA: Diagnosis not present

## 2016-07-25 DIAGNOSIS — E119 Type 2 diabetes mellitus without complications: Secondary | ICD-10-CM | POA: Insufficient documentation

## 2016-07-25 DIAGNOSIS — Z79891 Long term (current) use of opiate analgesic: Secondary | ICD-10-CM | POA: Insufficient documentation

## 2016-07-25 DIAGNOSIS — Z951 Presence of aortocoronary bypass graft: Secondary | ICD-10-CM | POA: Diagnosis not present

## 2016-07-25 DIAGNOSIS — R1011 Right upper quadrant pain: Secondary | ICD-10-CM | POA: Diagnosis present

## 2016-07-25 DIAGNOSIS — E11610 Type 2 diabetes mellitus with diabetic neuropathic arthropathy: Secondary | ICD-10-CM | POA: Diagnosis not present

## 2016-07-25 DIAGNOSIS — N200 Calculus of kidney: Secondary | ICD-10-CM | POA: Insufficient documentation

## 2016-07-25 DIAGNOSIS — Z8719 Personal history of other diseases of the digestive system: Secondary | ICD-10-CM | POA: Insufficient documentation

## 2016-07-25 DIAGNOSIS — Z89511 Acquired absence of right leg below knee: Secondary | ICD-10-CM | POA: Diagnosis not present

## 2016-07-25 DIAGNOSIS — Z96651 Presence of right artificial knee joint: Secondary | ICD-10-CM | POA: Diagnosis not present

## 2016-07-25 LAB — URINALYSIS, ROUTINE W REFLEX MICROSCOPIC
Bacteria, UA: NONE SEEN
Bilirubin Urine: NEGATIVE
Glucose, UA: >=500 mg/dL — AB
Ketones, ur: NEGATIVE mg/dL
Leukocytes, UA: NEGATIVE
Nitrite: NEGATIVE
PH: 5 (ref 5.0–8.0)
Protein, ur: NEGATIVE mg/dL
SPECIFIC GRAVITY, URINE: 1.014 (ref 1.005–1.030)

## 2016-07-25 NOTE — ED Triage Notes (Signed)
Pt presents to ED via POV with c/o RIGHT flank pain r/t kidney stones. Pt reports he was seen here a few days ago for the same; pt states he was r/x'd pain medication but didn't take any this morning "because it hurt too bad" and decided to seek treatment in the ED. Pt also denies any instructions being given during the last d/c about scheduling follow-up appointment with Urology. Pt does report straining his urine and has not noticed any stones in the filter.

## 2016-07-25 NOTE — Discharge Instructions (Signed)
Please follow up with the Mark Bean and urology. Please seek medical attention for any high fevers, chest pain, shortness of breath, change in behavior, persistent vomiting, bloody stool or any other new or concerning symptoms.

## 2016-07-25 NOTE — ED Provider Notes (Signed)
[facility] Emergency Department Provider Note  ____________________________________________   I have reviewed the triage vital signs and the nursing notes.   HISTORY  Chief Complaint Flank Pain   History limited by: Not Limited   HPI Mark Bean is a 71 y.o. male who presents to the emergency department today because of concerns for continued abdominal pain. Is located in the right upper quadrant. It has been persistent since he was seen in the emergency department a few days ago and diagnosed with a kidney stone. He did not take his pain medication this morning. He has not had any significant vomiting with the pain. He denies any bad odor or difficulty with urination. He denies any fevers. At the time of my examination the patient states that his pain has improved. He has not contacted the [place] or urology.  Past Medical History:  Diagnosis Date  . Anxiety   . Arthritis   . Diabetes mellitus without complication (HCC)   . Hypertension    dr Shonna Chock    [id number]  . Kidney stones   . Myocardial infarction (HCC)   . Pancreatitis, acute    caused by medicaion    Patient Active Problem List   Diagnosis Date Noted  . Non-pressure chronic ulcer of right calf, limited to breakdown of skin (HCC) 01/29/2016  . S/P unilateral BKA (below knee amputation), right (HCC) 12/03/2015  . 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 Subacute osteomyelitis, right ankle and foot [facility]     Past Surgical History:  Procedure Laterality Date  . AMPUTATION Right 10/11/2012   Procedure: 2nd ray amputation, medial column fusion and ostectomy right foot;  Surgeon: [name], MD;  Location: [facility] OR;  Service: Orthopedics;  Laterality: Right;  . AMPUTATION Right 06/15/2013   Procedure: AMPUTATION DIGIT;  Surgeon: [name], MD;  Location: [facility] OR;  Service: Orthopedics;  Laterality: Right;  Right 3rd Toe Amputation  . AMPUTATION Right 12/03/2015   Procedure:  AMPUTATION BELOW RIGHT KNEE;  Surgeon: [name], MD;  Location: [facility] OR;  Service: Orthopedics;  Laterality: Right;  . ANKLE FUSION Right 10/11/2012   Procedure: ARTHRODESIS ANKLE;  Surgeon: [name], MD;  Location: [facility] OR;  Service: Orthopedics;  Laterality: Right;  . APPLICATION OF WOUND VAC  12/03/2015  . CARDIAC CATHETERIZATION     2000  . COLONOSCOPY    . CORONARY ARTERY BYPASS GRAFT     2000   1 stent  . EYE SURGERY Right    cataract   . HARDWARE REMOVAL Right 01/05/2013   Procedure: HARDWARE REMOVAL;  Surgeon: [name], MD;  Location: [facility] OR;  Service: Orthopedics;  Laterality: Right;  Right Foot Debridement, Placement antibiotic beads, remove screw  . I&D EXTREMITY Right 11/10/2012   Procedure: IRRIGATION AND DEBRIDEMENT EXTREMITY;  Surgeon: [name], MD;  Location: [facility] OR;  Service: Orthopedics;  Laterality: Right;  Irrigation and Debridement Right Foot Wound,  Antibiotic Beads and Wound VAC  . JOINT REPLACEMENT Left    knee  . REPLACEMENT TOTAL KNEE Right 02/15/2011  . stents    . TONSILLECTOMY    . VASECTOMY      Prior to Admission medications   Medication Sig Start Date End Date Taking? Authorizing Provider  aspirin 325 MG tablet Take 325 mg by mouth at bedtime.    [provider]  atorvastatin (LIPITOR) 80 MG tablet Take 80 mg by mouth daily at 6 PM.  [provider]  carvedilol (COREG) 12.5 MG tablet Take 12.5 mg by mouth 2 (two) times daily with a meal.    [provider]  chlorthalidone (HYGROTON) 50 MG tablet Take 25 mg by mouth daily.     [provider]  cholecalciferol (VITAMIN D) 1000 units tablet Take 1,000 Units by mouth daily.    [provider]  ciprofloxacin (CIPRO) 500 MG tablet Take 1 tablet (500 mg total) by mouth 2 (two) times daily. 07/23/16   Irean Hong, MD  citalopram (CELEXA) 20 MG tablet Take 20 mg by mouth daily.    [provider]  cyclobenzaprine (FLEXERIL) 5 MG tablet Take 1  tablet (5 mg total) by mouth every 8 (eight) hours as needed for muscle spasms. 12/09/14   Beers, Charmayne Sheer, PA-C  doxycycline (VIBRA-TABS) 100 MG tablet TAKE 1 TABLET BY MOUTH TWICE A DAY 03/05/16   [name], MD  doxycycline (VIBRAMYCIN) 100 MG capsule Take 100 mg by mouth 2 (two) times daily. 01/01/16   [provider]  gabapentin (NEURONTIN) 100 MG capsule TAKE 1 CAPSULE (100 MG TOTAL) BY MOUTH 3 (THREE) TIMES DAILY. 06/23/16   [name], MD  insulin aspart (NOVOLOG) 100 UNIT/ML injection Inject 25 Units into the skin 3 (three) times daily with meals.     [provider]  insulin glargine (LANTUS) 100 UNIT/ML injection Inject 20 Units into the skin 2 (two) times daily.     [provider]  lisinopril (PRINIVIL,ZESTRIL) 40 MG tablet Take 40 mg by mouth daily.    [provider]  loperamide (IMODIUM A-D) 2 MG tablet Take 1 tablet (2 mg total) by mouth 4 (four) times daily as needed for diarrhea or loose stools. 07/04/15   Minna Antis, MD  metFORMIN (GLUCOPHAGE) 1000 MG tablet Take 1,000 mg by mouth 2 (two) times daily with a meal.    [provider]  Omega-3 Fatty Acids (FISH OIL) 1200 MG CAPS Take 1,200 mg by mouth daily.    [provider]  ondansetron (ZOFRAN ODT) 4 MG disintegrating tablet Take 1 tablet (4 mg total) by mouth every 8 (eight) hours as needed for nausea or vomiting. 07/23/16   Irean Hong, MD  oxyCODONE-acetaminophen (ROXICET) 5-325 MG tablet Take 1 tablet by mouth every 4 (four) hours as needed for severe pain. 07/23/16   Irean Hong, MD  silver sulfADIAZINE (SILVADENE) 1 % cream Apply 1 application topically daily. Apply to affected area daily plus dry dressing 01/29/16   [name], MD  tamsulosin (FLOMAX) 0.4 MG CAPS capsule Take 1 capsule (0.4 mg total) by mouth daily. 07/23/16   Irean Hong, MD    Allergies Patient has no known allergies.  No family history on file.  Social History Social History   Substance Use Topics  . Smoking status: Never Smoker  . Smokeless tobacco: Never Used  . Alcohol use No     Comment: little    Review of Systems Constitutional: No fever/chills Eyes: No visual changes. ENT: No sore throat. Cardiovascular: Denies chest pain. Respiratory: Denies shortness of breath. Gastrointestinal: positive for right upper quadrant pain Genitourinary: Negative for dysuria. Musculoskeletal: Negative for back pain. Skin: Negative for rash. Neurological: Negative for headaches, focal weakness or numbness.  ____________________________________________   PHYSICAL EXAM:  VITAL SIGNS: ED Triage Vitals  Enc Vitals Group     BP 07/25/16 0628 (!) 168/74     Pulse Rate 07/25/16 0628 74  Resp 07/25/16 0628 16     Temp 07/25/16 0628 98.4 F (36.9 C)     Temp Source 07/25/16 0628 Oral     SpO2 07/25/16 0628 97 %     Weight 07/25/16 0629 210 lb (95.3 kg)     Height 07/25/16 0629 5\' 8"  (1.727 m)     Head Circumference --      Peak Flow --      Pain Score 07/25/16 0628 9   Constitutional: Alert and oriented. Well appearing and in no distress. Eyes: Conjunctivae are normal.  ENT   Head: Normocephalic and atraumatic.   Nose: No congestion/rhinnorhea.   Mouth/Throat: Mucous membranes are moist.   Neck: No stridor. Hematological/Lymphatic/Immunilogical: No cervical lymphadenopathy. Cardiovascular: Normal rate, regular rhythm.  No murmurs, rubs, or gallops.  Respiratory: Normal respiratory effort without tachypnea nor retractions. Breath sounds are clear and equal bilaterally. No wheezes/rales/rhonchi. Gastrointestinal: Soft and Minimally tender in the right lower quadrant. No CVA tenderness. No rebound. No guarding.  Genitourinary: Deferred Musculoskeletal: Normal range of motion in all extremities. No lower extremity edema. Neurologic:  Normal speech and language. No gross focal neurologic deficits are appreciated.  Skin:  Skin is warm, dry and  intact. No rash noted. Psychiatric: Mood and affect are normal. Speech and behavior are normal. Patient exhibits appropriate insight and judgment.  ____________________________________________    LABS (pertinent positives/negatives)  None  ____________________________________________   EKG  None  ____________________________________________    RADIOLOGY  None  ____________________________________________   PROCEDURES  Procedures  ____________________________________________   INITIAL IMPRESSION / ASSESSMENT AND PLAN / ED COURSE  Pertinent labs & imaging results that were available during my care of the patient were reviewed by me and considered in my medical decision making (see chart for details).  Patient presented to the emergency department today with continued pain from a kidney stone.. The patient primarily had questions about management. I discussed with patient expected management given size. Additionally discussed with patient importance [facility]. At this point patient has been taking his Flomax and antibiotics. No fevers, bad odor or problems with urination to suggest infection. Will plan on discharging with urology information.  ____________________________________________   FINAL CLINICAL IMPRESSION(S) / ED DIAGNOSES  Final diagnoses:  Kidney stone     Note: This dictation was prepared with Dragon dictation. Any transcriptional errors that result from this process are unintentional     Phineas SemenGoodman, Shiya Fogelman, MD 07/25/16 [phone]

## 2016-08-02 ENCOUNTER — Ambulatory Visit (INDEPENDENT_AMBULATORY_CARE_PROVIDER_SITE_OTHER): Payer: Medicare Other | Admitting: Podiatry

## 2016-08-02 ENCOUNTER — Encounter: Payer: Self-pay | Admitting: Podiatry

## 2016-08-02 VITALS — BP 172/84 | HR 71

## 2016-08-02 DIAGNOSIS — T148XXA Other injury of unspecified body region, initial encounter: Secondary | ICD-10-CM

## 2016-08-02 DIAGNOSIS — B351 Tinea unguium: Secondary | ICD-10-CM | POA: Diagnosis not present

## 2016-08-02 DIAGNOSIS — M79675 Pain in left toe(s): Secondary | ICD-10-CM

## 2016-08-02 NOTE — Progress Notes (Signed)
   Subjective:    Patient ID: Mark Bean, male    DOB: 05/26/1945, 71 y.o.   MRN: [id number]  HPI this patient presents the office with chief complaint of a injured toenail on the big toe of his left foot.  He says he caught his toe last night and the toenail started to bleed.  He says he had previously made an appointment to be seen in my office for his long thick painful nails.  He does give a history of right leg amputation due to subacute osteomyelitis to both his ankle and his foot. He also relates that Charcot foot was involved in the amputation process.  He presents the office today for an evaluation and treatment of his injured big toenail, left foot.  He does give a history of diabetes mellitus with neuropathy.  He presents the office today for an evaluation of the left great toe and to schedule future nail appointments.    Review of Systems  All other systems reviewed and are negative.      Objective:   Physical Exam GENERAL APPEARANCE: Alert, conversant. Appropriately groomed. No acute distress.  VASCULAR: Pedal pulses are  [facility]  [facility] .  Capillary refill time is immediate to all digits,  Normal temperature gradient.   NEUROLOGIC: sensation is diminished  to 5.07 monofilament  Test..  Light touch left foot, Muscle strength normal.  MUSCULOSKELETAL: acceptable muscle strength, tone and stability..  Intrinsic muscluature intact .  Rectus appearance of foot and digits noted .  BK amputation right leg. NAILS  . Subungual hematoma noted on the left great toenail.  The nail plate is detached from the nailbed.  Thick disfigured discolored hallux toenail, left foot. DERMATOLOGIC: skin color, texture, and turgor are within normal limits.  No preulcerative lesions or ulcers  are seen, no interdigital maceration noted.  No open lesions present.         Assessment & Plan:  Subungual hematoma with onychomycosis left hallux.  Diabetes with neuropathy.  Initial exam.  Excision of nail plate left hallux. Neosporin and dry sterile dressing. Home instructions given. Patient to return the office if the problem persists. Otherwise patient will be seen in 3 months for preventative foot care services

## 2016-08-16 ENCOUNTER — Ambulatory Visit: Payer: Self-pay | Admitting: Urology

## 2016-08-18 ENCOUNTER — Other Ambulatory Visit (INDEPENDENT_AMBULATORY_CARE_PROVIDER_SITE_OTHER): Payer: Self-pay | Admitting: Orthopedic Surgery

## 2016-11-01 ENCOUNTER — Ambulatory Visit: Payer: Medicare Other | Admitting: Podiatry

## 2016-11-19 ENCOUNTER — Encounter: Payer: Self-pay | Admitting: Emergency Medicine

## 2016-11-19 ENCOUNTER — Emergency Department
Admission: EM | Admit: 2016-11-19 | Discharge: 2016-11-19 | Disposition: A | Discharge status: Home / Self Care | Payer: Medicare Other | Attending: Emergency Medicine | Admitting: Emergency Medicine

## 2016-11-19 DIAGNOSIS — Y92009 Unspecified place in unspecified non-institutional (private) residence as the place of occurrence of the external cause: Secondary | ICD-10-CM

## 2016-11-19 DIAGNOSIS — S3992XA Unspecified injury of lower back, initial encounter: Secondary | ICD-10-CM | POA: Diagnosis present

## 2016-11-19 DIAGNOSIS — Z7982 Long term (current) use of aspirin: Secondary | ICD-10-CM | POA: Diagnosis not present

## 2016-11-19 DIAGNOSIS — Z951 Presence of aortocoronary bypass graft: Secondary | ICD-10-CM | POA: Insufficient documentation

## 2016-11-19 DIAGNOSIS — E119 Type 2 diabetes mellitus without complications: Secondary | ICD-10-CM | POA: Insufficient documentation

## 2016-11-19 DIAGNOSIS — Z89511 Acquired absence of right leg below knee: Secondary | ICD-10-CM | POA: Insufficient documentation

## 2016-11-19 DIAGNOSIS — Z794 Long term (current) use of insulin: Secondary | ICD-10-CM | POA: Insufficient documentation

## 2016-11-19 DIAGNOSIS — W01198A Fall on same level from slipping, tripping and stumbling with subsequent striking against other object, initial encounter: Secondary | ICD-10-CM | POA: Diagnosis not present

## 2016-11-19 DIAGNOSIS — Y929 Unspecified place or not applicable: Secondary | ICD-10-CM | POA: Diagnosis not present

## 2016-11-19 DIAGNOSIS — Y998 Other external cause status: Secondary | ICD-10-CM | POA: Diagnosis not present

## 2016-11-19 DIAGNOSIS — Z96652 Presence of left artificial knee joint: Secondary | ICD-10-CM | POA: Insufficient documentation

## 2016-11-19 DIAGNOSIS — Y9389 Activity, other specified: Secondary | ICD-10-CM | POA: Insufficient documentation

## 2016-11-19 DIAGNOSIS — I252 Old myocardial infarction: Secondary | ICD-10-CM | POA: Diagnosis not present

## 2016-11-19 DIAGNOSIS — W19XXXA Unspecified fall, initial encounter: Secondary | ICD-10-CM

## 2016-11-19 DIAGNOSIS — I10 Essential (primary) hypertension: Secondary | ICD-10-CM | POA: Diagnosis not present

## 2016-11-19 DIAGNOSIS — S7002XA Contusion of left hip, initial encounter: Secondary | ICD-10-CM | POA: Insufficient documentation

## 2016-11-19 MED ORDER — OXYCODONE-ACETAMINOPHEN 5-325 MG PO TABS: 1.0000 | ORAL_TABLET | Freq: Four times a day (QID) | ORAL | 0 refills | Status: DC | PRN

## 2016-11-19 MED ORDER — OXYCODONE-ACETAMINOPHEN 5-325 MG PO TABS
1.0000 | ORAL_TABLET | Freq: Once | ORAL | Status: AC
  Administered 2016-11-19: 1 via ORAL
  Filled 2016-11-19: qty 1

## 2016-11-19 NOTE — ED Triage Notes (Addendum)
Pt in via POV, reports mechanical fall landing on hand railing.  Pt reports left lower back pain.  Pt ambulatory to triage room without difficulty.  NAD noted at this time.

## 2016-11-19 NOTE — ED Provider Notes (Signed)
[facility] Emergency Department Provider Note   ____________________________________________   First MD Initiated Contact with Patient 11/19/16 1423     (approximate)  I have reviewed the triage vital signs and the nursing notes.   HISTORY  Chief Complaint Fall   HPI Mark Bean is a 71 y.o. male   Complaint of left lower back pain. Patient states that he fell against a metal railing. Patient states that he is a BKA right side and did not have his prosthesis intact which caused him to fall. He continues to be ambulatory without assistance. Patient drove himself to the emergency department and is alone.  He  denies any head injury or loss of consciousness. He  denies any hematuria. Patient rates his pain as 10 over 10.   Past Medical History:  Diagnosis Date  . Anxiety   . Arthritis   . Diabetes mellitus without complication ([place])   . Hypertension    dr Farley Ly    [id number]  . Kidney stones   . Myocardial infarction ([place])   . Pancreatitis, acute    caused by medicaion    Patient Active Problem List   Diagnosis Date Noted  . Non-pressure chronic ulcer of right calf, limited to breakdown of skin ([place]) 01/29/2016  . S/P unilateral BKA (below knee amputation), right ([place]) 12/03/2015  . 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 Subacute osteomyelitis, right ankle and foot [facility])     Past Surgical History:  Procedure Laterality Date  . AMPUTATION Right 10/11/2012   Procedure: 2nd ray amputation, medial column fusion and ostectomy right foot;  Surgeon: Newt Minion, MD;  Location: [place];  Service: Orthopedics;  Laterality: Right;  . AMPUTATION Right 06/15/2013   Procedure: AMPUTATION DIGIT;  Surgeon: Newt Minion, MD;  Location: [place];  Service: Orthopedics;  Laterality: Right;  Right 3rd Toe Amputation  . AMPUTATION Right 12/03/2015   Procedure: AMPUTATION BELOW RIGHT KNEE;  Surgeon: Newt Minion, MD;  Location: [place];   Service: Orthopedics;  Laterality: Right;  . ANKLE FUSION Right 10/11/2012   Procedure: ARTHRODESIS ANKLE;  Surgeon: Newt Minion, MD;  Location: [place];  Service: Orthopedics;  Laterality: Right;  . APPLICATION OF WOUND VAC  12/03/2015  . CARDIAC CATHETERIZATION     2000  . COLONOSCOPY    . CORONARY ARTERY BYPASS GRAFT     2000   1 stent  . EYE SURGERY Right    cataract   . HARDWARE REMOVAL Right 01/05/2013   Procedure: HARDWARE REMOVAL;  Surgeon: Newt Minion, MD;  Location: [place];  Service: Orthopedics;  Laterality: Right;  Right Foot Debridement, Placement antibiotic beads, remove screw  . I&D EXTREMITY Right 11/10/2012   Procedure: IRRIGATION AND DEBRIDEMENT EXTREMITY;  Surgeon: Newt Minion, MD;  Location: [place];  Service: Orthopedics;  Laterality: Right;  Irrigation and Debridement Right Foot Wound,  Antibiotic Beads and Wound VAC  . JOINT REPLACEMENT Left    knee  . REPLACEMENT TOTAL KNEE Right 02/15/2011  . stents    . TONSILLECTOMY    . VASECTOMY      Prior to Admission medications   Medication Sig Start Date End Date Taking? Authorizing Provider  aspirin (ADULT ASPIRIN EC LOW STRENGTH) 81 MG EC tablet aspirin    [provider]  atorvastatin (LIPITOR) 80 MG tablet atorvastatin 80 mg tablet  Take 1 tablet every day by oral route.    [provider]  carvedilol (COREG) 12.5 MG tablet Take 12.5 mg by mouth 2 (two) times daily with a meal.    [provider]  chlorthalidone (HYGROTON) 50 MG tablet chlorthalidone 50 mg tablet  Take 1 tablet every day by oral route.    [provider]  citalopram (CELEXA) 40 MG tablet citalopram 40 mg tablet  Take 1 tablet every day by oral route.    [provider]  cyanocobalamin 100 MCG tablet Take 100 mcg by mouth daily.    [provider]  dextrose (GLUTOSE) 40 % GEL Place 0.5 mL/kg inside cheek as needed. Take 1 tube by mouth every day  As directed,use as needed for low blood sugar     [provider]  gabapentin (NEURONTIN) 100 MG capsule TAKE 1 CAPSULE BY MOUTH THREE TIMES A DAY 08/18/16   Suzan Slick, NP  glucagon (GLUCAGEN) 1 MG SOLR injection Inject 1 mg into the vein once as needed for low blood sugar. Emergency kit , inject 70m kit intramuscularly as directed as needed for critical low blood sugar    [provider]  insulin aspart (NOVOLOG) 100 unit/mL injection Inject 10 Units into the skin 3 (three) times daily before meals. 100unit/ML,inject 16 units under skin 3 times daily with meals    [provider]  oxyCODONE-acetaminophen (PERCOCET) 5-325 MG tablet Take 1 tablet by mouth every 6 (six) hours as needed for severe pain. 11/19/16   SJohnn Hai PA-C  UNABLE TO FIND cholecalciferol (vit D3) 1,000 unit-vitamin K2 (MK4) 100 mcg tablet    [provider]    Allergies Patient has no known allergies.  No family history on file.  Social History Social History  Substance Use Topics  . Smoking status: Never Smoker  . Smokeless tobacco: Never Used  . Alcohol use No     Comment: little    Review of Systems Constitutional: No fever/chills Cardiovascular: Denies chest pain. Respiratory: Denies shortness of breath. Gastrointestinal: No abdominal pain.  No nausea, no vomiting.  Genitourinary: negative for hematuria Musculoskeletal: positive for left-sided low back pain. Skin: Negative for rash. Neurological: Negative for headaches, focal weakness or numbness. ____________________________________________   PHYSICAL EXAM:  VITAL SIGNS: ED Triage Vitals  Enc Vitals Group     BP 11/19/16 1353 (!) 150/121     Pulse Rate 11/19/16 1353 70     Resp 11/19/16 1353 16     Temp 11/19/16 1353 98.8 F (37.1 C)     Temp Source 11/19/16 1353 Oral     SpO2 11/19/16 1353 94 %     Weight 11/19/16 1353 220 lb (99.8 kg)     Height 11/19/16 1353 _0  (1.727 m)     Head Circumference --      Peak Flow --      Pain Score 11/19/16  1352 10     Pain Loc --      Pain Edu? --      Excl. in [place] --    Constitutional: Alert and oriented. Well appearing and in no acute distress. Eyes: Conjunctivae are normal.  Head: Atraumatic. Nose: no trauma Neck: No stridor.   Cardiovascular: Normal rate, regular rhythm. Grossly normal heart sounds.  Good peripheral circulation. Respiratory: Normal respiratory effort.  No retractions. Lungs CTAB.  No ecchymosis or abrasions are noted. There is no tenderness on palpation of the left ribs. Gastrointestinal: Soft and nontender. No distention.  No CVA tenderness. Musculoskeletal: on examination the back there is no point tenderness  on palpation of the thoracic or lumbar spine. There is some minimal tenderness on palpation of the left iliac crest. No soft tissue swelling is present. No ecchymosis or abrasions were seen. Patient continues to ambulate in the room without any assistance. Neurologic:  Normal speech and language. No gross focal neurologic deficits are appreciated.  Skin:  Skin is warm, dry and intact. No ecchymosis or abrasions were noted. Psychiatric: Mood and affect are normal. Speech and behavior are normal.  ____________________________________________   LABS (all labs ordered are listed, but only abnormal results are displayed)  Labs Reviewed - No data to display  RADIOLOGY  deferred ____________________________________________   PROCEDURES  Procedure(s) performed: None  Procedures  Critical Care performed: No  ____________________________________________   INITIAL IMPRESSION / ASSESSMENT AND PLAN / ED COURSE  As part of my medical decision making, I reviewed the following data within the electronic MEDICAL RECORD NUMBER Notes from prior ED visits and [place] Controlled Substance Database   ----------------------------------------- 3:26 PM on 11/19/2016 ----------------------------------------- Patient became very disgruntled because provider would not give her  narcotic medication and let him drive home. He announced that he was leaving however he did have someone come to pick him up. I spoke with his friend who understands that Mr. Keil needs to remove his vehicle in the parking lot if he is given a pain medication. Patient was given Percocet. He is also given a prescription for one every 6 hours. He is encouraged to use ice as needed and follow-up with his regular doctor at the [facility]. ____________________________________________   FINAL CLINICAL IMPRESSION(S) / ED DIAGNOSES  Final diagnoses:  Contusion of left hip, initial encounter  Fall in home, initial encounter      NEW MEDICATIONS STARTED DURING THIS VISIT:  Discharge Medication List as of 11/19/2016  3:33 PM    START taking these medications   Details  oxyCODONE-acetaminophen (PERCOCET) 5-325 MG tablet Take 1 tablet by mouth every 6 (six) hours as needed for severe pain., Starting Fri 11/19/2016, Print         Note:  This document was prepared using Dragon voice recognition software and may include unintentional dictation errors.    Johnn Hai, PA-C 11/19/16 1836    Carrie Mew, MD 11/20/16 1630

## 2016-11-19 NOTE — ED Notes (Signed)
NAD noted at time of D/C. Pt denies questions or concerns. Pt ambulatory to the lobby at this time.  

## 2016-11-19 NOTE — Discharge Instructions (Signed)
Follow-up with your  primary care provider if any continued problems. Use ice to your left side as needed for pain or discomfort. Take Percocet every 6 hours as needed for pain. Do not drive or operate machinery while taking this medication. Do not drink alcohol while taking this medication.

## 2017-01-03 ENCOUNTER — Other Ambulatory Visit (INDEPENDENT_AMBULATORY_CARE_PROVIDER_SITE_OTHER): Payer: Self-pay | Admitting: Family

## 2017-04-04 ENCOUNTER — Encounter: Payer: Self-pay | Admitting: *Deleted

## 2017-04-06 ENCOUNTER — Ambulatory Visit
Admission: RE | Admit: 2017-04-06 | Discharge: 2017-04-06 | Disposition: A | Discharge status: Home / Self Care | Payer: Medicare Other | Source: Ambulatory Visit | Attending: Ophthalmology | Admitting: Ophthalmology

## 2017-04-06 ENCOUNTER — Ambulatory Visit: Payer: Medicare Other | Admitting: Certified Registered"

## 2017-04-06 ENCOUNTER — Encounter
Admission: RE | Disposition: A | Discharge status: Home / Self Care | Payer: Self-pay | Source: Ambulatory Visit | Attending: Ophthalmology

## 2017-04-06 DIAGNOSIS — I10 Essential (primary) hypertension: Secondary | ICD-10-CM | POA: Insufficient documentation

## 2017-04-06 DIAGNOSIS — Z79899 Other long term (current) drug therapy: Secondary | ICD-10-CM | POA: Diagnosis not present

## 2017-04-06 DIAGNOSIS — I2510 Atherosclerotic heart disease of native coronary artery without angina pectoris: Secondary | ICD-10-CM | POA: Insufficient documentation

## 2017-04-06 DIAGNOSIS — E119 Type 2 diabetes mellitus without complications: Secondary | ICD-10-CM | POA: Diagnosis not present

## 2017-04-06 DIAGNOSIS — Z77098 Contact with and (suspected) exposure to other hazardous, chiefly nonmedicinal, chemicals: Secondary | ICD-10-CM | POA: Diagnosis not present

## 2017-04-06 DIAGNOSIS — Z794 Long term (current) use of insulin: Secondary | ICD-10-CM | POA: Diagnosis not present

## 2017-04-06 DIAGNOSIS — Z951 Presence of aortocoronary bypass graft: Secondary | ICD-10-CM | POA: Insufficient documentation

## 2017-04-06 DIAGNOSIS — G4730 Sleep apnea, unspecified: Secondary | ICD-10-CM | POA: Diagnosis not present

## 2017-04-06 DIAGNOSIS — E7800 Pure hypercholesterolemia, unspecified: Secondary | ICD-10-CM | POA: Diagnosis not present

## 2017-04-06 DIAGNOSIS — F419 Anxiety disorder, unspecified: Secondary | ICD-10-CM | POA: Diagnosis not present

## 2017-04-06 DIAGNOSIS — Z89511 Acquired absence of right leg below knee: Secondary | ICD-10-CM | POA: Diagnosis not present

## 2017-04-06 DIAGNOSIS — H2511 Age-related nuclear cataract, right eye: Secondary | ICD-10-CM | POA: Diagnosis present

## 2017-04-06 DIAGNOSIS — Z96652 Presence of left artificial knee joint: Secondary | ICD-10-CM | POA: Insufficient documentation

## 2017-04-06 HISTORY — DX: Contact with and (suspected) exposure to other hazardous, chiefly nonmedicinal, chemicals: Z77.098

## 2017-04-06 HISTORY — DX: Sleep apnea, unspecified: G47.30

## 2017-04-06 HISTORY — DX: Atherosclerotic heart disease of native coronary artery without angina pectoris: I25.10

## 2017-04-06 HISTORY — PX: CATARACT EXTRACTION W/PHACO: SHX586

## 2017-04-06 LAB — GLUCOSE, CAPILLARY: Glucose-Capillary: 179 mg/dL — ABNORMAL HIGH (ref 65–99)

## 2017-04-06 SURGERY — PHACOEMULSIFICATION, CATARACT, WITH IOL INSERTION
Anesthesia: Monitor Anesthesia Care | Site: Eye | Laterality: Right | Wound class: Clean

## 2017-04-06 MED ORDER — EPINEPHRINE PF 1 MG/ML IJ SOLN
INTRAMUSCULAR | Status: DC | PRN
  Administered 2017-04-06: 09:00:00 via OPHTHALMIC

## 2017-04-06 MED ORDER — POVIDONE-IODINE 5 % OP SOLN
OPHTHALMIC | Status: DC | PRN
  Administered 2017-04-06: 1 via OPHTHALMIC

## 2017-04-06 MED ORDER — GLYCOPYRROLATE 0.2 MG/ML IJ SOLN
INTRAMUSCULAR | Status: AC
Start: 2017-04-06 — End: ?
  Filled 2017-04-06: qty 1

## 2017-04-06 MED ORDER — CARBACHOL 0.01 % IO SOLN
INTRAOCULAR | Status: DC | PRN
  Administered 2017-04-06: 0.5 mL via INTRAOCULAR

## 2017-04-06 MED ORDER — MIDAZOLAM HCL 2 MG/2ML IJ SOLN
INTRAMUSCULAR | Status: DC | PRN
  Administered 2017-04-06: 2 mg via INTRAVENOUS

## 2017-04-06 MED ORDER — SODIUM HYALURONATE 10 MG/ML IO SOLN
INTRAOCULAR | Status: DC | PRN
  Administered 2017-04-06 (×2): 0.55 mL via INTRAOCULAR

## 2017-04-06 MED ORDER — ARMC OPHTHALMIC DILATING DROPS
1.0000 "application " | OPHTHALMIC | Status: AC
  Administered 2017-04-06 (×3): 1 via OPHTHALMIC

## 2017-04-06 MED ORDER — ARMC OPHTHALMIC DILATING DROPS
OPHTHALMIC | Status: AC
  Administered 2017-04-06: 1 via OPHTHALMIC
  Filled 2017-04-06: qty 0.4

## 2017-04-06 MED ORDER — MIDAZOLAM HCL 2 MG/2ML IJ SOLN
INTRAMUSCULAR | Status: AC
  Filled 2017-04-06: qty 2

## 2017-04-06 MED ORDER — FENTANYL CITRATE (PF) 100 MCG/2ML IJ SOLN
INTRAMUSCULAR | Status: AC
  Filled 2017-04-06: qty 2

## 2017-04-06 MED ORDER — FENTANYL CITRATE (PF) 100 MCG/2ML IJ SOLN
INTRAMUSCULAR | Status: DC | PRN
  Administered 2017-04-06 (×2): 50 ug via INTRAVENOUS

## 2017-04-06 MED ORDER — MOXIFLOXACIN HCL 0.5 % OP SOLN
OPHTHALMIC | Status: DC | PRN
  Administered 2017-04-06: 0.2 mL via OPHTHALMIC

## 2017-04-06 MED ORDER — MOXIFLOXACIN HCL 0.5 % OP SOLN: 1.0000 [drp] | OPHTHALMIC | Status: DC | PRN

## 2017-04-06 MED ORDER — ONDANSETRON HCL 4 MG/2ML IJ SOLN
INTRAMUSCULAR | Status: DC | PRN
  Administered 2017-04-06: 4 mg via INTRAVENOUS

## 2017-04-06 MED ORDER — SODIUM HYALURONATE 23 MG/ML IO SOLN
INTRAOCULAR | Status: AC
  Filled 2017-04-06: qty 0.6

## 2017-04-06 MED ORDER — EPINEPHRINE PF 1 MG/ML IJ SOLN
INTRAMUSCULAR | Status: AC
  Filled 2017-04-06: qty 2

## 2017-04-06 MED ORDER — ONDANSETRON HCL 4 MG/2ML IJ SOLN
INTRAMUSCULAR | Status: AC
Start: 2017-04-06 — End: ?
  Filled 2017-04-06: qty 2

## 2017-04-06 MED ORDER — SODIUM HYALURONATE 23 MG/ML IO SOLN
INTRAOCULAR | Status: DC | PRN
  Administered 2017-04-06: 0.6 mL via INTRAOCULAR

## 2017-04-06 MED ORDER — SODIUM CHLORIDE 0.9 % IV SOLN
INTRAVENOUS | Status: DC
  Administered 2017-04-06: 08:00:00 via INTRAVENOUS

## 2017-04-06 MED ORDER — BSS IO SOLN
INTRAOCULAR | Status: DC | PRN
  Administered 2017-04-06: 4 mL via OPHTHALMIC

## 2017-04-06 MED ORDER — LIDOCAINE HCL (PF) 4 % IJ SOLN
INTRAMUSCULAR | Status: AC
  Filled 2017-04-06: qty 5

## 2017-04-06 MED ORDER — GLYCOPYRROLATE 0.2 MG/ML IJ SOLN
INTRAMUSCULAR | Status: DC | PRN
  Administered 2017-04-06: 0.1 mg via INTRAVENOUS

## 2017-04-06 MED ORDER — POVIDONE-IODINE 5 % OP SOLN
OPHTHALMIC | Status: AC
  Filled 2017-04-06: qty 30

## 2017-04-06 MED ORDER — MOXIFLOXACIN HCL 0.5 % OP SOLN
OPHTHALMIC | Status: AC
  Filled 2017-04-06: qty 3

## 2017-04-06 SURGICAL SUPPLY — 20 items
DISSECTOR HYDRO NUCLEUS 50X22 (MISCELLANEOUS) ×3 IMPLANT
GLOVE BIO SURGEON STRL SZ8 (GLOVE) ×3 IMPLANT
GLOVE BIOGEL M 6.5 STRL (GLOVE) ×3 IMPLANT
GLOVE SURG LX 7.5 STRW (GLOVE) ×2
GLOVE SURG LX STRL 7.5 STRW (GLOVE) ×1 IMPLANT
GOWN STRL REUS W/ TWL LRG LVL3 (GOWN DISPOSABLE) ×2 IMPLANT
GOWN STRL REUS W/TWL LRG LVL3 (GOWN DISPOSABLE) ×6
KIT SLEEVE INFUSION .9 MICRO (MISCELLANEOUS) ×2 IMPLANT
LABEL CATARACT MEDS ST (LABEL) ×3 IMPLANT
LENS IOL TECNIS ITEC 20.0 (Intraocular Lens) ×2 IMPLANT
PACK CATARACT (MISCELLANEOUS) ×3 IMPLANT
PACK CATARACT KING (MISCELLANEOUS) ×3 IMPLANT
PACK EYE AFTER SURG (MISCELLANEOUS) ×3 IMPLANT
RING MALYGIN (MISCELLANEOUS) ×2 IMPLANT
SOL BAL SALT 15ML (MISCELLANEOUS) ×3
SOL BSS BAG (MISCELLANEOUS) ×3
SOLUTION BAL SALT 15ML (MISCELLANEOUS) IMPLANT
SOLUTION BSS BAG (MISCELLANEOUS) ×1 IMPLANT
WATER STERILE IRR 250ML POUR (IV SOLUTION) ×3 IMPLANT
WIPE NON LINTING 3.25X3.25 (MISCELLANEOUS) ×3 IMPLANT

## 2017-04-06 NOTE — Anesthesia Postprocedure Evaluation (Signed)
Anesthesia Post Note  Patient: Mark Bean  Procedure(s) Performed: CATARACT EXTRACTION PHACO AND INTRAOCULAR LENS PLACEMENT (IOC) (Right Eye)  Patient location during evaluation: PACU Anesthesia Type: MAC Level of consciousness: awake and alert Pain management: pain level controlled Vital Signs Assessment: post-procedure vital signs reviewed and stable Respiratory status: spontaneous breathing, nonlabored ventilation, respiratory function stable and patient connected to nasal cannula oxygen Cardiovascular status: stable and blood pressure returned to baseline Postop Assessment: no apparent nausea or vomiting Anesthetic complications: no     Last Vitals:  Vitals:   04/06/17 0924 04/06/17 0933  BP: 133/69 119/68  Pulse: 66 (!) 57  Resp: 14 16  Temp: (!) 36.2 C   SpO2: 96%     Last Pain:  Vitals:   04/06/17 0924  TempSrc: Temporal                 Martha Clan

## 2017-04-06 NOTE — Op Note (Signed)
OPERATIVE NOTE  Mark RuffingRoger D Bean [id number] 04/06/2017   PREOPERATIVE DIAGNOSIS:  Nuclear sclerotic cataract right eye.  H25.11   POSTOPERATIVE DIAGNOSIS:     1.  Nuclear sclerotic cataract right eye.   2.  Intraoperative floppy iris syndrome.   PROCEDURE:  Phacoemusification with posterior chamber intraocular lens placement of the right eye   LENS:   Implant Name Type Inv. Item Serial No. Manufacturer Lot No. LRB No. Used  LENS IOL DIOP 20.0 - [id number] Intraocular Lens LENS IOL DIOP 20.0 [id number]  Right 1       PCB00 +20.0   ULTRASOUND TIME: 1 minutes 01 seconds.  CDE 8.07   SURGEON:  Willey BladeBradley Eddy Liszewski, MD, MPH  ANESTHESIOLOGIST: Anesthesiologist: Lenard SimmerKarenz, Andrew, MD CRNA: Sherol DadeMacMang, Josephine H, CRNA   ANESTHESIA:  Topical with tetracaine drops augmented with 1% preservative-free intracameral lidocaine.  ESTIMATED BLOOD LOSS: less than 1 mL.   COMPLICATIONS:  None.   DESCRIPTION OF PROCEDURE:  The patient was identified in the holding room and transported to the operating room and placed in the supine position under the operating microscope.  The right eye was identified as the operative eye and it was prepped and draped in the usual sterile ophthalmic fashion.   A 1.0 millimeter clear-corneal paracentesis was made at the 10:30 position. 0.5 ml of preservative-free 1% lidocaine with epinephrine was injected into the anterior chamber.  The anterior chamber was filled with Healon 5 viscoelastic.    A malyugin ring was placed because the pupil was miotic and did not adequately viscodilate.  A 2.4 millimeter keratome was used to make a near-clear corneal incision at the 8:00 position.  A curvilinear capsulorrhexis was made with a cystotome and capsulorrhexis forceps.  Balanced salt solution was used to hydrodissect and hydrodelineate the nucleus.   Phacoemulsification was then used in stop and chop fashion to remove the lens nucleus and epinucleus.  The remaining cortex was  then removed using the irrigation and aspiration handpiece. Healon was then placed into the capsular bag to distend it for lens placement.  A lens was then injected into the capsular bag.    The ring was removed.   The remaining viscoelastic was aspirated.   Wounds were hydrated with balanced salt solution.  The anterior chamber was inflated to a physiologic pressure with balanced salt solution.   Intracameral vigamox 0.1 mL undiluted was injected into the eye and a drop placed onto the ocular surface.  No wound leaks were noted.  The patient was taken to the recovery room in stable condition without complications of anesthesia or surgery  Willey BladeBradley Zoi Devine 04/06/2017, 9:20 AM

## 2017-04-06 NOTE — H&P (Signed)
The History and Physical notes are on paper, have been signed, and are to be scanned.   I have examined the patient and there are no changes to the H&P.   Willey BladeBradley Danile Trier 04/06/2017 8:28 AM

## 2017-04-06 NOTE — Anesthesia Preprocedure Evaluation (Signed)
Anesthesia Evaluation  Patient identified by MRN, date of birth, ID band Patient awake    Reviewed: Allergy & Precautions, H&P , NPO status , Patient's Chart, lab work & pertinent test results, reviewed documented beta blocker date and time   History of Anesthesia Complications Negative for: history of anesthetic complications  Airway Mallampati: III  TM Distance: >3 FB Neck ROM: full    Dental  (+) Partial Upper, Dental Advidsory Given   Pulmonary neg shortness of breath, sleep apnea , neg COPD, neg recent URI,           Cardiovascular Exercise Tolerance: Good hypertension, (-) angina+ CAD, + Past MI, + Cardiac Stents and + CABG  (-) dysrhythmias (-) Valvular Problems/Murmurs     Neuro/Psych PSYCHIATRIC DISORDERS Anxiety negative neurological ROS     GI/Hepatic negative GI ROS, Neg liver ROS,   Endo/Other  diabetes  Renal/GU Renal disease (kidney stones)  negative genitourinary   Musculoskeletal   Abdominal   Peds  Hematology negative hematology ROS (+)   Anesthesia Other Findings Past Medical History: No date: Anxiety No date: Arthritis No date: Coronary artery disease No date: Diabetes mellitus without complication (HCC) No date: Exposure to Agent Orange No date: Hypertension     Comment:  dr Farley Ly    [id number] No date: Kidney stones No date: Myocardial infarction [facility])     Comment:  silent mi No date: Pancreatitis, acute     Comment:  caused by medicaion No date: Sleep apnea     Comment:  c pap   Reproductive/Obstetrics negative OB ROS                             Anesthesia Physical Anesthesia Plan  ASA: III  Anesthesia Plan: MAC   Post-op Pain Management:    Induction:   PONV Risk Score and Plan:   Airway Management Planned: Natural Airway and Nasal Cannula  Additional Equipment:   Intra-op Plan:   Post-operative Plan:   Informed Consent: I have reviewed  the patients History and Physical, chart, labs and discussed the procedure including the risks, benefits and alternatives for the proposed anesthesia with the patient or authorized representative who has indicated his/her understanding and acceptance.   Dental Advisory Given  Plan Discussed with: Anesthesiologist, CRNA and Surgeon  Anesthesia Plan Comments:         Anesthesia Quick Evaluation

## 2017-04-06 NOTE — Discharge Instructions (Addendum)
Follow Dr. Elmer BalesKing's eye drop instruction sheet as reviewed.  Eye Surgery Discharge Instructions  Expect mild scratchy sensation or mild soreness. DO NOT RUB YOUR EYE!  The day of surgery:  Minimal physical activity, but bed rest is not required  No reading, computer work, or close hand work  No bending, lifting, or straining.  May watch TV  For 24 hours:  No driving, legal decisions, or alcoholic beverages  Safety precautions  Eat anything you prefer: It is better to start with liquids, then soup then solid foods.  _____ Eye patch should be worn until postoperative exam tomorrow.  ____ Solar shield eyeglasses should be worn for comfort in the sunlight/patch while sleeping  Resume all regular medications including aspirin or Coumadin if these were discontinued prior to surgery. You may shower, bathe, shave, or wash your hair. Tylenol may be taken for mild discomfort.  Call your doctor if you experience significant pain, nausea, or vomiting, fever > 101 or other signs of infection. [phone] or [phone] Specific instructions:

## 2017-04-06 NOTE — Transfer of Care (Signed)
Immediate Anesthesia Transfer of Care Note  Patient: Mark Bean  Procedure(s) Performed: CATARACT EXTRACTION PHACO AND INTRAOCULAR LENS PLACEMENT (IOC) (Right Eye)  Patient Location: PACU and Short Stay  Anesthesia Type:MAC  Level of Consciousness: awake, alert , oriented and patient cooperative  Airway & Oxygen Therapy: Patient Spontanous Breathing  Post-op Assessment: Report given to RN, Post -op Vital signs reviewed and stable and Patient moving all extremities X 4  Post vital signs: Reviewed and stable  Last Vitals:  Vitals:   04/06/17 0734  BP: (!) 141/68  Pulse: (!) 59  Resp: 17  Temp: (!) 36.4 C  SpO2: 96%    Last Pain:  Vitals:   04/06/17 0734  TempSrc: Temporal         Complications: No apparent anesthesia complications

## 2017-04-06 NOTE — Anesthesia Post-op Follow-up Note (Signed)
Anesthesia QCDR form completed.        

## 2017-12-15 ENCOUNTER — Other Ambulatory Visit: Payer: Self-pay

## 2017-12-15 ENCOUNTER — Emergency Department: Payer: Medicare Other

## 2017-12-15 ENCOUNTER — Emergency Department
Admission: EM | Admit: 2017-12-15 | Discharge: 2017-12-15 | Disposition: A | Discharge status: Home / Self Care | Payer: Medicare Other | Attending: Emergency Medicine | Admitting: Emergency Medicine

## 2017-12-15 ENCOUNTER — Encounter: Payer: Self-pay | Admitting: Intensive Care

## 2017-12-15 DIAGNOSIS — Z955 Presence of coronary angioplasty implant and graft: Secondary | ICD-10-CM | POA: Diagnosis not present

## 2017-12-15 DIAGNOSIS — Z794 Long term (current) use of insulin: Secondary | ICD-10-CM | POA: Insufficient documentation

## 2017-12-15 DIAGNOSIS — Z96651 Presence of right artificial knee joint: Secondary | ICD-10-CM | POA: Insufficient documentation

## 2017-12-15 DIAGNOSIS — I2510 Atherosclerotic heart disease of native coronary artery without angina pectoris: Secondary | ICD-10-CM | POA: Diagnosis not present

## 2017-12-15 DIAGNOSIS — I252 Old myocardial infarction: Secondary | ICD-10-CM | POA: Diagnosis not present

## 2017-12-15 DIAGNOSIS — Z951 Presence of aortocoronary bypass graft: Secondary | ICD-10-CM | POA: Insufficient documentation

## 2017-12-15 DIAGNOSIS — E119 Type 2 diabetes mellitus without complications: Secondary | ICD-10-CM | POA: Insufficient documentation

## 2017-12-15 DIAGNOSIS — I10 Essential (primary) hypertension: Secondary | ICD-10-CM | POA: Insufficient documentation

## 2017-12-15 DIAGNOSIS — Z79899 Other long term (current) drug therapy: Secondary | ICD-10-CM | POA: Insufficient documentation

## 2017-12-15 DIAGNOSIS — R0789 Other chest pain: Secondary | ICD-10-CM | POA: Diagnosis not present

## 2017-12-15 DIAGNOSIS — R0602 Shortness of breath: Secondary | ICD-10-CM | POA: Insufficient documentation

## 2017-12-15 DIAGNOSIS — Z7982 Long term (current) use of aspirin: Secondary | ICD-10-CM | POA: Diagnosis not present

## 2017-12-15 DIAGNOSIS — R079 Chest pain, unspecified: Secondary | ICD-10-CM | POA: Diagnosis present

## 2017-12-15 LAB — BASIC METABOLIC PANEL
Anion gap: 8 (ref 5–15)
BUN: 18 mg/dL (ref 8–23)
CALCIUM: 9.7 mg/dL (ref 8.9–10.3)
CHLORIDE: 104 mmol/L (ref 98–111)
CO2: 31 mmol/L (ref 22–32)
CREATININE: 1.09 mg/dL (ref 0.61–1.24)
GFR calc non Af Amer: >60 mL/min (ref >60)
Glucose, Bld: 243 mg/dL — ABNORMAL HIGH (ref 70–99)
Potassium: 3.8 mmol/L (ref 3.5–5.1)
SODIUM: 143 mmol/L (ref 135–145)

## 2017-12-15 LAB — CBC
HCT: 38.4 % — ABNORMAL LOW (ref 39.0–52.0)
Hemoglobin: 12.6 g/dL — ABNORMAL LOW (ref 13.0–17.0)
MCH: 27.7 pg (ref 26.0–34.0)
MCHC: 32.8 g/dL (ref 30.0–36.0)
MCV: 84.4 fL (ref 80.0–100.0)
NRBC: 0 % (ref 0.0–0.2)
PLATELETS: 185 10*3/uL (ref 150–400)
RBC: 4.55 MIL/uL (ref 4.22–5.81)
RDW: 12.9 % (ref 11.5–15.5)
WBC: 4.6 10*3/uL (ref 4.0–10.5)

## 2017-12-15 LAB — TROPONIN I
Troponin I: <0.03 ng/mL (ref <0.03)
Troponin I: <0.03 ng/mL (ref <0.03)

## 2017-12-15 MED ORDER — ASPIRIN 81 MG PO CHEW
243.0000 mg | CHEWABLE_TABLET | Freq: Once | ORAL | Status: AC
  Administered 2017-12-15: 243 mg via ORAL
  Filled 2017-12-15: qty 3

## 2017-12-15 NOTE — ED Provider Notes (Signed)
[facility] Emergency Department Provider Note ____________________________________________   First MD Initiated Contact with Patient 12/15/17 1703     (approximate)  I have reviewed the triage vital signs and the nursing notes.   HISTORY  Chief Complaint Chest Pain    HPI Mark Bean is a 72 y.o. male with PMH as noted below including CAD status post CABG more than 10 years ago who presents with chest pain, acute onset this afternoon approximately 30 minutes prior to coming to the hospital, described as pressure-like and substernal in location, nonradiating.  He reports some associated shortness of breath, but denies nausea or lightheadedness.  He states he ate a bologna sandwich earlier but denies any unusual foods or eating more than normal.  He has not had any similar chest pain in a long time.   Past Medical History:  Diagnosis Date  . Anxiety   . Arthritis   . Coronary artery disease   . Diabetes mellitus without complication ([place])   . Exposure to [employer]   . Hypertension    dr Farley Ly    [id number]  . Kidney stones   . Myocardial infarction ([place])    silent mi  . Pancreatitis, acute    caused by medicaion  . Sleep apnea    c pap    Patient Active Problem List   Diagnosis Date Noted  . Non-pressure chronic ulcer of right calf, limited to breakdown of skin ([place]) 01/29/2016  . S/P unilateral BKA (below knee amputation), right ([place]) 12/03/2015  . 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 Subacute osteomyelitis, right ankle and foot [facility])     Past Surgical History:  Procedure Laterality Date  . AMPUTATION Right 10/11/2012   Procedure: 2nd ray amputation, medial column fusion and ostectomy right foot;  Surgeon: Newt Minion, MD;  Location: [place];  Service: Orthopedics;  Laterality: Right;  . AMPUTATION Right 06/15/2013   Procedure: AMPUTATION DIGIT;  Surgeon: Newt Minion, MD;  Location: [place];  Service: Orthopedics;   Laterality: Right;  Right 3rd Toe Amputation  . AMPUTATION Right 12/03/2015   Procedure: AMPUTATION BELOW RIGHT KNEE;  Surgeon: Newt Minion, MD;  Location: [place];  Service: Orthopedics;  Laterality: Right;  . ANKLE FUSION Right 10/11/2012   Procedure: ARTHRODESIS ANKLE;  Surgeon: Newt Minion, MD;  Location: [place];  Service: Orthopedics;  Laterality: Right;  . APPLICATION OF WOUND VAC  12/03/2015  . CARDIAC CATHETERIZATION     2000  . CATARACT EXTRACTION W/PHACO Right 04/06/2017   Procedure: CATARACT EXTRACTION PHACO AND INTRAOCULAR LENS PLACEMENT (IOC);  Surgeon: Eulogio Bear, MD;  Location: [facility];  Service: Ophthalmology;  Laterality: Right;  [place] 01:01.5 AP% 13.1 CDE 8.07 Fluid pack lot # [id number]  . COLONOSCOPY    . CORONARY ANGIOPLASTY     stent  . CORONARY ARTERY BYPASS GRAFT     2000   1 stent  . EYE SURGERY Right    cataract   . HARDWARE REMOVAL Right 01/05/2013   Procedure: HARDWARE REMOVAL;  Surgeon: Newt Minion, MD;  Location: [place];  Service: Orthopedics;  Laterality: Right;  Right Foot Debridement, Placement antibiotic beads, remove screw  . I&D EXTREMITY Right 11/10/2012   Procedure: IRRIGATION AND DEBRIDEMENT EXTREMITY;  Surgeon: Newt Minion, MD;  Location: [place];  Service: Orthopedics;  Laterality: Right;  Irrigation and Debridement Right Foot Wound,  Antibiotic Beads and Wound VAC  . JOINT REPLACEMENT Left  knee  . REPLACEMENT TOTAL KNEE Right 02/15/2011  . stents    . TONSILLECTOMY    . VASECTOMY      Prior to Admission medications   Medication Sig Start Date End Date Taking? Authorizing Provider  amLODipine (NORVASC) 5 MG tablet Take 5 mg by mouth daily. 11/25/17 11/25/18 Yes [provider]  aspirin EC 81 MG tablet Take 81 mg by mouth daily.   Yes [provider]  atorvastatin (LIPITOR) 80 MG tablet Take 80 mg by mouth daily.   Yes [provider]  carvedilol (COREG) 12.5 MG tablet Take 12.5 mg by mouth 2 (two) times  daily with a meal.   Yes [provider]  chlorthalidone (HYGROTON) 25 MG tablet Take 25 mg by mouth daily.   Yes [provider]  cholecalciferol (VITAMIN D) 1000 units tablet Take 2,000 Units by mouth daily.   Yes [provider]  citalopram (CELEXA) 40 MG tablet Take 20 mg by mouth daily.   Yes [provider]  cyanocobalamin 100 MCG tablet Take 100 mcg by mouth daily.   Yes [provider]  dextrose (GLUTOSE) 40 % GEL Place 0.5 mL/kg inside cheek as needed. Take 1 tube by mouth every day  As directed,use as needed for low blood sugar   Yes [provider]  gabapentin (NEURONTIN) 100 MG capsule TAKE 1 CAPSULE BY MOUTH THREE TIMES A DAY 01/03/17  Yes Newt Minion, MD  insulin aspart (NOVOLOG) 100 unit/mL injection Inject 30 Units into the skin 3 (three) times daily before meals. 100unit/ML,inject 16 units under skin 3 times daily with meals    Yes [provider]  insulin glargine (LANTUS) 100 UNIT/ML injection Inject 30 Units into the skin 2 (two) times daily.    Yes [provider]  lisinopril (PRINIVIL,ZESTRIL) 40 MG tablet Take 40 mg by mouth daily.   Yes [provider]  Melatonin 5 MG TABS Take 1 tablet by mouth at bedtime.   Yes [provider]  metFORMIN (GLUCOPHAGE-XR) 500 MG 24 hr tablet Take 1,000 mg by mouth 2 (two) times daily with a meal.   Yes [provider]  Omega-3 Fatty Acids (FISH OIL) 600 MG CAPS Take 600 mg by mouth daily.   Yes [provider]  glucagon (GLUCAGEN) 1 MG SOLR injection Inject 1 mg into the vein once as needed for low blood sugar. Emergency kit , inject 37m kit intramuscularly as directed as needed for critical low blood sugar    [provider]    Allergies Patient has no known allergies.  History reviewed. No pertinent family history.  Social History Social History   Tobacco Use  . Smoking status: Never Smoker  . Smokeless tobacco:  Never Used  Substance Use Topics  . Alcohol use: Yes    Alcohol/week: 12.0 standard drinks    Types: 12 Cans of beer per week  . Drug use: No    Review of Systems  Constitutional: No fever. Eyes: No redness. ENT: No neck pain. Cardiovascular: Positive for chest pain. Respiratory: Positive for shortness of breath. Gastrointestinal: No nausea or vomiting.  Genitourinary: Negative for flank pain.  Musculoskeletal: Negative for back pain. Skin: Negative for rash. Neurological: Negative for headache.   ____________________________________________   PHYSICAL EXAM:  VITAL SIGNS: ED Triage Vitals  Enc Vitals Group     BP 12/15/17 1650 (!) 154/72     Pulse Rate 12/15/17 1650 61     Resp 12/15/17 1650 16  Temp 12/15/17 1650 98.1 F (36.7 C)     Temp Source 12/15/17 1650 Oral     SpO2 12/15/17 1650 99 %     Weight 12/15/17 1651 225 lb (102.1 kg)     Height 12/15/17 1651 _0  (1.727 m)     Head Circumference --      Peak Flow --      Pain Score 12/15/17 1651 10     Pain Loc --      Pain Edu? --      Excl. in [place]? --     Constitutional: Alert and oriented.  Slightly uncomfortable appearing but in no acute distress. Eyes: Conjunctivae are normal.  Head: Atraumatic. Nose: No congestion/rhinnorhea. Mouth/Throat: Mucous membranes are moist.   Neck: Normal range of motion.  Cardiovascular: Normal rate, regular rhythm. Grossly normal heart sounds.  Good peripheral circulation. Respiratory: Normal respiratory effort.  No retractions. Lungs CTAB. Gastrointestinal: No distention.  Musculoskeletal: Extremities warm and well perfused.  Neurologic:  Normal speech and language. No gross focal neurologic deficits are appreciated.  Skin:  Skin is warm and dry. No rash noted. Psychiatric: Mood and affect are normal. Speech and behavior are normal.  ____________________________________________   LABS (all labs ordered are listed, but only abnormal results are displayed)  Labs  Reviewed  BASIC METABOLIC PANEL - Abnormal; Notable for the following components:      Result Value   Glucose, Bld 243 (*)    All other components within normal limits  CBC - Abnormal; Notable for the following components:   Hemoglobin 12.6 (*)    HCT 38.4 (*)    All other components within normal limits  TROPONIN I   ____________________________________________  EKG  ED ECG REPORT I, Arta Silence, the attending physician, personally viewed and interpreted this ECG.  Date: 12/15/2017 EKG Time: 1658 Rate: 62 Rhythm: normal sinus rhythm QRS Axis: normal Intervals: Nonspecific IVCD ST/T Wave abnormalities: Nonspecific lateral T wave flattening Narrative Interpretation: no evidence of acute ischemia; no significant change when compared to EKG of 07/22/2016  ____________________________________________  RADIOLOGY  CXR: No focal infiltrate or other acute abnormality  ____________________________________________   PROCEDURES  Procedure(s) performed: No  Procedures  Critical Care performed: No ____________________________________________   INITIAL IMPRESSION / ASSESSMENT AND PLAN / ED COURSE  Pertinent labs & imaging results that were available during my care of the patient were reviewed by me and considered in my medical decision making (see chart for details).  72 year old male with history of CAD and remote history of CABG presents with nonexertional chest pain, acute onset at 4:30 today and associated with some shortness of breath.  I reviewed the past medical records in epic; the patient is seen by Dr. Nehemiah Massed from cardiology and was last seen earlier this month for a routine visit.  He has not had recent cardiac diagnostics.  On exam he is slightly uncomfortable but relatively well-appearing overall.  His vital signs are normal except for hypertension.  The remainder of the exam is unremarkable.  EKG shows no concerning ischemic findings.  Overall given the  patient's elevated risk, I am somewhat concerned for ACS although differential also includes GERD, musculoskeletal pain, or other benign etiology.  Given his overall relatively well appearance and the lack of tachycardia or significant hypertension, there is no clinical evidence to suggest aortic dissection or other vascular etiology.  Given the lack of DVT risk factors, DVT symptoms or any tachycardia or hypoxia there is no clinical evidence for PE.  We will obtain basic labs, troponins x2 with a repeat at least 4 hours from symptom onset, chest x-ray, and reassess.  ----------------------------------------- 7:44 PM on 12/15/2017 -----------------------------------------  Initial troponin is negative.  The x-ray and other labs are unremarkable except for elevated glucose.  The patient is now pain-free and believes that he was having indigestion.  The patient wanted to go home.  Given his elevated risk of ACS I want to wait a full 4 hours from symptom onset to obtain the repeat troponin.  When I explained this to the patient, he agreed.  Repeat troponin will be drawn at 830.  I am signing the patient out to the oncoming physician Dr. Jacqualine Code.  I anticipate discharge home if the repeat troponin is negative and the patient remains asymptomatic.  ____________________________________________   FINAL CLINICAL IMPRESSION(S) / ED DIAGNOSES  Final diagnoses:  Atypical chest pain      NEW MEDICATIONS STARTED DURING THIS VISIT:  New Prescriptions   No medications on file     Note:  This document was prepared using Dragon voice recognition software and may include unintentional dictation errors.    Arta Silence, MD 12/15/17 1945

## 2017-12-15 NOTE — ED Notes (Signed)
Collected blue, light green, lavender top, sent to lab for testing.

## 2017-12-15 NOTE — Discharge Instructions (Signed)
Call Dr. Philemon KingdomKowalski's office next week to arrange for follow-up.  Return to the ER for new, worsening, persistent severe chest pain, difficulty breathing, weakness or lightheadedness, or any other new or worsening symptoms that concern you.

## 2017-12-15 NOTE — ED Triage Notes (Signed)
Patient c/o chest pressure since 4:30pm. No radiation. HX MI and stents. A&O x4

## 2018-06-29 ENCOUNTER — Inpatient Hospital Stay
Admission: EM | Admit: 2018-06-29 | Discharge: 2018-07-07 | DRG: 438 | Disposition: A | Discharge status: Home / Self Care | Payer: Medicare Other | Attending: Internal Medicine | Admitting: Internal Medicine

## 2018-06-29 ENCOUNTER — Emergency Department: Payer: Medicare Other

## 2018-06-29 ENCOUNTER — Encounter: Payer: Self-pay | Admitting: Emergency Medicine

## 2018-06-29 DIAGNOSIS — K8050 Calculus of bile duct without cholangitis or cholecystitis without obstruction: Secondary | ICD-10-CM | POA: Diagnosis present

## 2018-06-29 DIAGNOSIS — E876 Hypokalemia: Secondary | ICD-10-CM | POA: Diagnosis not present

## 2018-06-29 DIAGNOSIS — J9601 Acute respiratory failure with hypoxia: Secondary | ICD-10-CM | POA: Diagnosis not present

## 2018-06-29 DIAGNOSIS — Z955 Presence of coronary angioplasty implant and graft: Secondary | ICD-10-CM

## 2018-06-29 DIAGNOSIS — E669 Obesity, unspecified: Secondary | ICD-10-CM | POA: Diagnosis present

## 2018-06-29 DIAGNOSIS — I10 Essential (primary) hypertension: Secondary | ICD-10-CM | POA: Diagnosis present

## 2018-06-29 DIAGNOSIS — R109 Unspecified abdominal pain: Secondary | ICD-10-CM

## 2018-06-29 DIAGNOSIS — R161 Splenomegaly, not elsewhere classified: Secondary | ICD-10-CM | POA: Diagnosis present

## 2018-06-29 DIAGNOSIS — R0602 Shortness of breath: Secondary | ICD-10-CM

## 2018-06-29 DIAGNOSIS — K859 Acute pancreatitis without necrosis or infection, unspecified: Secondary | ICD-10-CM | POA: Diagnosis present

## 2018-06-29 DIAGNOSIS — E1165 Type 2 diabetes mellitus with hyperglycemia: Secondary | ICD-10-CM | POA: Diagnosis present

## 2018-06-29 DIAGNOSIS — E860 Dehydration: Secondary | ICD-10-CM | POA: Diagnosis present

## 2018-06-29 DIAGNOSIS — Z20828 Contact with and (suspected) exposure to other viral communicable diseases: Secondary | ICD-10-CM | POA: Diagnosis present

## 2018-06-29 DIAGNOSIS — J90 Pleural effusion, not elsewhere classified: Secondary | ICD-10-CM | POA: Diagnosis not present

## 2018-06-29 DIAGNOSIS — Z7982 Long term (current) use of aspirin: Secondary | ICD-10-CM

## 2018-06-29 DIAGNOSIS — R0902 Hypoxemia: Secondary | ICD-10-CM

## 2018-06-29 DIAGNOSIS — G4733 Obstructive sleep apnea (adult) (pediatric): Secondary | ICD-10-CM | POA: Diagnosis present

## 2018-06-29 DIAGNOSIS — Z951 Presence of aortocoronary bypass graft: Secondary | ICD-10-CM

## 2018-06-29 DIAGNOSIS — Z89511 Acquired absence of right leg below knee: Secondary | ICD-10-CM

## 2018-06-29 DIAGNOSIS — Z79899 Other long term (current) drug therapy: Secondary | ICD-10-CM

## 2018-06-29 DIAGNOSIS — F419 Anxiety disorder, unspecified: Secondary | ICD-10-CM | POA: Diagnosis present

## 2018-06-29 DIAGNOSIS — E11610 Type 2 diabetes mellitus with diabetic neuropathic arthropathy: Secondary | ICD-10-CM | POA: Diagnosis present

## 2018-06-29 DIAGNOSIS — R748 Abnormal levels of other serum enzymes: Secondary | ICD-10-CM

## 2018-06-29 DIAGNOSIS — Z87442 Personal history of urinary calculi: Secondary | ICD-10-CM

## 2018-06-29 DIAGNOSIS — R112 Nausea with vomiting, unspecified: Secondary | ICD-10-CM | POA: Diagnosis not present

## 2018-06-29 DIAGNOSIS — K831 Obstruction of bile duct: Secondary | ICD-10-CM

## 2018-06-29 DIAGNOSIS — N179 Acute kidney failure, unspecified: Secondary | ICD-10-CM | POA: Diagnosis present

## 2018-06-29 DIAGNOSIS — Z79890 Hormone replacement therapy (postmenopausal): Secondary | ICD-10-CM

## 2018-06-29 DIAGNOSIS — M1990 Unspecified osteoarthritis, unspecified site: Secondary | ICD-10-CM | POA: Diagnosis present

## 2018-06-29 DIAGNOSIS — K851 Biliary acute pancreatitis without necrosis or infection: Secondary | ICD-10-CM | POA: Diagnosis not present

## 2018-06-29 DIAGNOSIS — R339 Retention of urine, unspecified: Secondary | ICD-10-CM | POA: Diagnosis not present

## 2018-06-29 DIAGNOSIS — E119 Type 2 diabetes mellitus without complications: Secondary | ICD-10-CM

## 2018-06-29 DIAGNOSIS — Z77098 Contact with and (suspected) exposure to other hazardous, chiefly nonmedicinal, chemicals: Secondary | ICD-10-CM | POA: Diagnosis present

## 2018-06-29 DIAGNOSIS — G9341 Metabolic encephalopathy: Secondary | ICD-10-CM | POA: Diagnosis not present

## 2018-06-29 DIAGNOSIS — K8071 Calculus of gallbladder and bile duct without cholecystitis with obstruction: Secondary | ICD-10-CM | POA: Diagnosis present

## 2018-06-29 DIAGNOSIS — K4090 Unilateral inguinal hernia, without obstruction or gangrene, not specified as recurrent: Secondary | ICD-10-CM | POA: Diagnosis present

## 2018-06-29 DIAGNOSIS — Z6833 Body mass index (BMI) 33.0-33.9, adult: Secondary | ICD-10-CM

## 2018-06-29 DIAGNOSIS — I252 Old myocardial infarction: Secondary | ICD-10-CM

## 2018-06-29 DIAGNOSIS — A0472 Enterocolitis due to Clostridium difficile, not specified as recurrent: Secondary | ICD-10-CM | POA: Diagnosis not present

## 2018-06-29 DIAGNOSIS — Z794 Long term (current) use of insulin: Secondary | ICD-10-CM

## 2018-06-29 DIAGNOSIS — I2510 Atherosclerotic heart disease of native coronary artery without angina pectoris: Secondary | ICD-10-CM | POA: Diagnosis present

## 2018-06-29 LAB — CBC
HCT: 37.6 % — ABNORMAL LOW (ref 39.0–52.0)
Hemoglobin: 12.7 g/dL — ABNORMAL LOW (ref 13.0–17.0)
MCH: 29.7 pg (ref 26.0–34.0)
MCHC: 33.8 g/dL (ref 30.0–36.0)
MCV: 87.9 fL (ref 80.0–100.0)
Platelets: 211 10*3/uL (ref 150–400)
RBC: 4.28 MIL/uL (ref 4.22–5.81)
RDW: 13 % (ref 11.5–15.5)
WBC: 5.2 10*3/uL (ref 4.0–10.5)
nRBC: 0 % (ref 0.0–0.2)

## 2018-06-29 LAB — HEPATIC FUNCTION PANEL
ALT: 102 U/L — ABNORMAL HIGH (ref 0–44)
AST: 182 U/L — ABNORMAL HIGH (ref 15–41)
Albumin: 4.2 g/dL (ref 3.5–5.0)
Alkaline Phosphatase: 85 U/L (ref 38–126)
Bilirubin, Direct: 0.4 mg/dL — ABNORMAL HIGH (ref 0.0–0.2)
Indirect Bilirubin: 0.5 mg/dL (ref 0.3–0.9)
Total Bilirubin: 0.9 mg/dL (ref 0.3–1.2)
Total Protein: 7.1 g/dL (ref 6.5–8.1)

## 2018-06-29 LAB — TROPONIN I
Troponin I: <0.03 ng/mL (ref <0.03)
Troponin I: <0.03 ng/mL (ref <0.03)

## 2018-06-29 LAB — BASIC METABOLIC PANEL
Anion gap: 7 (ref 5–15)
BUN: 36 mg/dL — ABNORMAL HIGH (ref 8–23)
CO2: 26 mmol/L (ref 22–32)
Calcium: 9.6 mg/dL (ref 8.9–10.3)
Chloride: 105 mmol/L (ref 98–111)
Creatinine, Ser: 1.53 mg/dL — ABNORMAL HIGH (ref 0.61–1.24)
GFR calc Af Amer: 52 mL/min — ABNORMAL LOW (ref >60)
GFR calc non Af Amer: 44 mL/min — ABNORMAL LOW (ref >60)
Glucose, Bld: 181 mg/dL — ABNORMAL HIGH (ref 70–99)
Potassium: 3.6 mmol/L (ref 3.5–5.1)
Sodium: 138 mmol/L (ref 135–145)

## 2018-06-29 LAB — LIPASE, BLOOD: Lipase: 4090 U/L — ABNORMAL HIGH (ref 11–51)

## 2018-06-29 MED ORDER — SODIUM CHLORIDE 0.9 % IV BOLUS
500.0000 mL | Freq: Once | INTRAVENOUS | Status: AC
  Administered 2018-06-29: 500 mL via INTRAVENOUS

## 2018-06-29 MED ORDER — IOHEXOL 240 MG/ML SOLN: 50.0000 mL | Freq: Once | INTRAMUSCULAR | Status: DC

## 2018-06-29 MED ORDER — FENTANYL CITRATE (PF) 100 MCG/2ML IJ SOLN
50.0000 ug | Freq: Once | INTRAMUSCULAR | Status: AC
  Administered 2018-06-29: 50 ug via INTRAVENOUS

## 2018-06-29 MED ORDER — SODIUM CHLORIDE 0.9 % IV SOLN
Freq: Once | INTRAVENOUS | Status: AC
  Administered 2018-06-29: 21:00:00 via INTRAVENOUS

## 2018-06-29 MED ORDER — LIDOCAINE VISCOUS HCL 2 % MT SOLN
15.0000 mL | Freq: Once | OROMUCOSAL | Status: AC
  Administered 2018-06-29: 15 mL via ORAL
  Filled 2018-06-29: qty 15

## 2018-06-29 MED ORDER — IOHEXOL 300 MG/ML  SOLN
100.0000 mL | Freq: Once | INTRAMUSCULAR | Status: AC | PRN
  Administered 2018-06-29: 75 mL via INTRAVENOUS

## 2018-06-29 MED ORDER — FENTANYL CITRATE (PF) 100 MCG/2ML IJ SOLN
50.0000 ug | Freq: Once | INTRAMUSCULAR | Status: AC
  Administered 2018-06-29: 25 ug via INTRAVENOUS
  Filled 2018-06-29: qty 2

## 2018-06-29 MED ORDER — ONDANSETRON HCL 4 MG/2ML IJ SOLN
4.0000 mg | Freq: Once | INTRAMUSCULAR | Status: AC
  Administered 2018-06-29: 4 mg via INTRAVENOUS
  Filled 2018-06-29: qty 2

## 2018-06-29 MED ORDER — ALUM & MAG HYDROXIDE-SIMETH 200-200-20 MG/5ML PO SUSP
30.0000 mL | Freq: Once | ORAL | Status: AC
  Administered 2018-06-29: 30 mL via ORAL
  Filled 2018-06-29: qty 30

## 2018-06-29 NOTE — ED Notes (Addendum)
This Probation officer in to check on pt due to call light going off and pt is actively vomiting at this time, pt provided new emesis bag and wash cloth, pt asking for something for nausea. RN Arby Barrette notified

## 2018-06-29 NOTE — ED Notes (Signed)
PT given mouth swabs to wet mouth  

## 2018-06-29 NOTE — ED Triage Notes (Signed)
Pt c/o sudden onset of lefts sided chest pressure/pain with SOB and nausea x35 minutes ago. Pt has MI hx. Pt denies radiating pain.

## 2018-06-29 NOTE — ED Notes (Signed)
md made aware of pt vomiting post medication- orders for IV zofran

## 2018-06-29 NOTE — ED Notes (Signed)
PT is on toilet to have BM. Instructed to call RN with call bell when ready to get up

## 2018-06-29 NOTE — ED Provider Notes (Addendum)
[facility] Emergency Department Provider Note  ____________________________________________   I have reviewed the triage vital signs and the nursing notes. Where available I have reviewed prior notes and, if possible and indicated, outside hospital notes.    HISTORY  Chief Complaint Chest Pain    HPI Sherry RuffingRoger D Adelstein is a 73 y.o. male patient seen and evaluated during the coronavirus epidemic during a time with low staffing has a history of diabetes mellitus, and ACS, status post BKA on the right, he denies any recollection of a history of pancreatitis presents today with epigastric abdominal discomfort which began after he ate barbecue, he had one loose stool as well.  No fever.  He denies actual chest pain or, rather, he states he is having chest pain but when he says this he does point to his epigastric region.  He is not had any fever or cough.  He has had nausea and vomited x1 which was nonbloody nonbilious.  No prior treatment no other medical history.    Past Medical History:  Diagnosis Date  . Anxiety   . Arthritis   . Coronary artery disease   . Diabetes mellitus without complication (HCC)   . Exposure to [employer] [place]   . Hypertension    dr Shonna [place]i    [id number]  . Kidney stones   . Myocardial infarction (HCC)    silent mi  . Pancreatitis, acute    caused by medicaion  . Sleep apnea    c pap    Patient Active Problem List   Diagnosis Date Noted  . Non-pressure chronic ulcer of right calf, limited to breakdown of skin (HCC) 01/29/2016  . S/P unilateral BKA (below knee amputation), right (HCC) 12/03/2015  . 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 Subacute osteomyelitis, right ankle and foot [facility](HCC)     Past Surgical History:  Procedure Laterality Date  . AMPUTATION Right 10/11/2012   Procedure: 2nd ray amputation, medial column fusion and ostectomy right foot;  Surgeon: Nadara MustardMarcus V Duda, MD;  Location: [facility] OR;  Service:  Orthopedics;  Laterality: Right;  . AMPUTATION Right 06/15/2013   Procedure: AMPUTATION DIGIT;  Surgeon: Nadara MustardMarcus Duda V, MD;  Location: [facility] OR;  Service: Orthopedics;  Laterality: Right;  Right 3rd Toe Amputation  . AMPUTATION Right 12/03/2015   Procedure: AMPUTATION BELOW RIGHT KNEE;  Surgeon: Nadara MustardMarcus V Duda, MD;  Location: [facility] OR;  Service: Orthopedics;  Laterality: Right;  . ANKLE FUSION Right 10/11/2012   Procedure: ARTHRODESIS ANKLE;  Surgeon: Nadara MustardMarcus V Duda, MD;  [facility];  Service: Orthopedics;  Laterality: Right;  . APPLICATION OF WOUND VAC  12/03/2015  . CARDIAC CATHETERIZATION     2000  . CATARACT EXTRACTION W/PHACO Right 04/06/2017   Procedure: CATARACT EXTRACTION PHACO AND INTRAOCULAR LENS PLACEMENT (IOC);  Surgeon: Nevada CraneKing, Bradley Mark, MD;  Location: [facility] ORS;  Service: Ophthalmology;  Laterality: Right;  US 01:01.5 AP% 13.1 CDE 8.07 Fluid pack lot # [id number]  . COLONOSCOPY    . CORONARY ANGIOPLASTY     stent  . CORONARY ARTERY BYPASS GRAFT     2000   1 stent  . EYE SURGERY Right    cataract   . HARDWARE REMOVAL Right 01/05/2013   Procedure: HARDWARE REMOVAL;  Surgeon: Nadara MustardMarcus V Duda, MD;  [facility];  Service: Orthopedics;  Laterality: Right;  Right Foot Debridement, Placement antibiotic beads, remove screw  . I&D EXTREMITY Right 11/10/2012   Procedure: IRRIGATION AND DEBRIDEMENT EXTREMITY;  Surgeon:  Nadara MustardMarcus V Duda, MD;  [facility];  Service: Orthopedics;  Laterality: Right;  Irrigation and Debridement Right Foot Wound,  Antibiotic Beads and Wound VAC  . JOINT REPLACEMENT Left    knee  . REPLACEMENT TOTAL KNEE Right 02/15/2011  . stents    . TONSILLECTOMY    . VASECTOMY      Prior to Admission medications   Medication Sig Start Date End Date Taking? Authorizing Provider  amLODipine (NORVASC) 10 MG tablet Take 5 mg by mouth daily.  11/25/17 11/25/18 Yes [provider]  aspirin EC 81 MG tablet Take 81 mg by mouth at bedtime.    Yes [provider]  atorvastatin (LIPITOR) 80 MG tablet Take 80 mg by mouth daily.   Yes [provider]  carvedilol (COREG) 12.5 MG tablet Take 12.5 mg by mouth 2 (two) times daily with a meal.   Yes [provider]  chlorthalidone (HYGROTON) 50 MG tablet Take 50 mg by mouth daily.    Yes [provider]  cholecalciferol (VITAMIN D) 1000 units tablet Take 2,000 Units by mouth daily.   Yes [provider]  citalopram (CELEXA) 40 MG tablet Take 20 mg by mouth daily.   Yes [provider]  cyanocobalamin 1000 MCG tablet Take 1,000 mcg by mouth daily.    Yes [provider]  gabapentin (NEURONTIN) 100 MG capsule TAKE 1 CAPSULE BY MOUTH THREE TIMES A DAY Patient taking differently: Take 100-200 mg by mouth 2 (two) times daily. 100mg  in the morning and 200mg  at bedtime 01/03/17  Yes Nadara Mustarduda, Marcus V, MD  insulin aspart (NOVOLOG) 100 unit/mL injection Inject 20 Units into the skin 2 (two) times a day.    Yes [provider]  insulin glargine (LANTUS) 100 UNIT/ML injection Inject 28 Units into the skin at bedtime.    Yes [provider]  liraglutide (VICTOZA) 18 MG/3ML SOPN Inject 1.2 mg into the skin at bedtime.   Yes [provider]  lisinopril (PRINIVIL,ZESTRIL) 40 MG tablet Take 40 mg by mouth daily.   Yes [provider]  Melatonin 10 MG TABS Take 10 mg by mouth at bedtime.    Yes [provider]  Omega-3 Fatty Acids (FISH OIL) 1200 MG CAPS Take 1,200 mg by mouth daily.    Yes [provider]    Allergies Patient has no known allergies.  History reviewed. No pertinent family history.  Social History Social History   Tobacco Use  . Smoking status: Never Smoker  . Smokeless tobacco: Never Used  Substance Use Topics  . Alcohol use: Yes    Alcohol/week: 12.0 standard drinks    Types: 12 Cans of beer per week  . Drug use: No    Review of Systems Constitutional: No fever/chills Eyes: No visual  changes. ENT: No sore throat. No stiff neck no neck pain Cardiovascular: Denies chest pain. Respiratory: Denies shortness of breath. Gastrointestinal: See HPI Genitourinary: Negative for dysuria. Musculoskeletal: Negative lower extremity swelling Skin: Negative for rash. Neurological: Negative for severe headaches, focal weakness or numbness.   ____________________________________________   PHYSICAL EXAM:  VITAL SIGNS: ED Triage Vitals [06/29/18 1946]  Enc Vitals Group     BP (!) 132/58     Pulse Rate 72     Resp 18     Temp 98 F (36.7 C)     Temp Source Oral     SpO2 98 %     Weight      Height  Head Circumference      Peak Flow      Pain Score      Pain Loc      Pain Edu?      Excl. in [place]?     Constitutional: Alert and oriented. Well appearing and in no acute distress. Eyes: Conjunctivae are normal Head: Atraumatic HEENT: No congestion/rhinnorhea. Mucous membranes are moist.  Oropharynx non-erythematous Neck:   Nontender with no meningismus, no masses, no stridor Cardiovascular: Normal rate, regular rhythm. Grossly normal heart sounds.  Good peripheral circulation. Respiratory: Normal respiratory effort.  No retractions. Lungs CTAB. Abdominal: Soft and normal epigastric discomfort which reproduces his pain. No distention. No guarding no rebound Back:  There is no focal tenderness or step off.  there is no midline tenderness there are no lesions noted. there is no CVA tenderness Musculoskeletal: No lower extremity tenderness, no upper extremity tenderness. No joint effusions, no DVT signs strong distal pulses no edema Neurologic:  Normal speech and language. No gross focal neurologic deficits are appreciated.  Skin:  Skin is warm, dry and intact. No rash noted. Psychiatric: Mood and affect are normal. Speech and behavior are normal.  ____________________________________________   LABS (all labs ordered are listed, but only abnormal results are  displayed)  Labs Reviewed  BASIC METABOLIC PANEL - Abnormal; Notable for the following components:      Result Value   Glucose, Bld 181 (*)    BUN 36 (*)    Creatinine, Ser 1.53 (*)    GFR calc non Af Amer 44 (*)    GFR calc Af Amer 52 (*)    All other components within normal limits  CBC - Abnormal; Notable for the following components:   Hemoglobin 12.7 (*)    HCT 37.6 (*)    All other components within normal limits  HEPATIC FUNCTION PANEL - Abnormal; Notable for the following components:   AST 182 (*)    ALT 102 (*)    Bilirubin, Direct 0.4 (*)    All other components within normal limits  TROPONIN I  TROPONIN I  LIPASE, BLOOD    Pertinent labs  results that were available during my care of the patient were reviewed by me and considered in my medical decision making (see chart for details). ____________________________________________  EKG  I personally interpreted any EKGs ordered by me or triage EKGs were obtained on this patient, sinus rhythm rate 71, no acute ST elevation or depression, normal axis, no change in the second ____________________________________________  RADIOLOGY  Pertinent labs & imaging results that were available during my care of the patient were reviewed by me and considered in my medical decision making (see chart for details). If possible, patient and/or family made aware of any abnormal findings.  Ct Abdomen Pelvis W Contrast  Result Date: 06/29/2018 CLINICAL DATA:  Epigastric abdominal pain EXAM: CT ABDOMEN AND PELVIS WITH CONTRAST TECHNIQUE: Multidetector CT imaging of the abdomen and pelvis was performed using the standard protocol following bolus administration of intravenous contrast. CONTRAST:  50mL OMNIPAQUE IOHEXOL 300 MG/ML  SOLN COMPARISON:  CT dated 07/23/2016 FINDINGS: Lower chest: No acute abnormality. Hepatobiliary: There is likely underlying hepatic steatosis. Multiple gallstones are noted. There may be a gallstone at the distal  CBD. There is mild intrahepatic and extrahepatic biliary ductal dilatation. Pancreas: There is diffuse peripancreatic fat stranding. The pancreas enhances uniformly. There is no well-formed drainable fluid collection at this time. Spleen: The spleen is borderline enlarged. Adrenals/Urinary Tract: Adrenal glands are unremarkable.  Kidneys are normal, without renal calculi, focal lesion, or hydronephrosis. Bladder is unremarkable. Stomach/Bowel: There is a moderate-sized hiatal hernia. There is scattered colonic diverticula without CT evidence of diverticulitis. There is a normal appendix in the right lower quadrant. There is no evidence of a high-grade small bowel obstruction. Vascular/Lymphatic: Aortic atherosclerosis. No enlarged abdominal or pelvic lymph nodes. There is a patent right common iliac artery stent. The splenic artery and splenic vein are patent. Reproductive: The prostate gland is mildly enlarged. Other: There is stable subcutaneous densities involving the low anterior abdominal wall. These may represent injection granulomas. There is a fat containing left inguinal hernia. Musculoskeletal: No acute or significant osseous findings. IMPRESSION: 1. Findings consistent with acute uncomplicated pancreatitis. 2. Findings suspicious for choledocholithiasis with a small stone at the distal CBD. 3. Cholelithiasis. There is mild intrahepatic and extrahepatic biliary ductal dilatation. There is no CT evidence of acute cholecystitis. 4. Borderline splenomegaly. 5.  Aortic Atherosclerosis (ICD10-I70.0). Electronically Signed   By: Katherine Mantlehristopher  Green M.D.   On: 06/29/2018 22:42   Dg Chest Port 1 View  Result Date: 06/29/2018 CLINICAL DATA:  Chest pain EXAM: PORTABLE CHEST 1 VIEW COMPARISON:  12/15/2017 FINDINGS: The heart size is stable. The lung volumes are low. The patient is status post prior median sternotomy. There is no acute osseous abnormality. No large pleural effusion or pneumothorax. There are  degenerative changes of both glenohumeral joints. IMPRESSION: No active disease. Electronically Signed   By: Katherine Mantlehristopher  Green M.D.   On: 06/29/2018 20:34   ____________________________________________    PROCEDURES  Procedure(s) performed: None  Procedures  Critical Care performed: None  ____________________________________________   INITIAL IMPRESSION / ASSESSMENT AND PLAN / ED COURSE  Pertinent labs & imaging results that were available during my care of the patient were reviewed by me and considered in my medical decision making (see chart for details). Here with epigastric abdominal pain, he thought it might be his heart, but it appears not to be to me clinically.  CT scan shows acute pancreatitis with a possibility of a gallstone, we are sending him for an ultrasound we have given him pain medications he is feeling more comfortable, cardiac enzymes are thus far negative I do not think this represents ACS.  Signed out to Dr. Manson PasseyBrown at the end of my shift.  Lipase is pending.      ____________________________________________   FINAL CLINICAL IMPRESSION(S) / ED DIAGNOSES  Final diagnoses:  Abdominal pain      This chart was dictated using voice recognition software.  Despite best efforts to proofread,  errors can occur which can change meaning.      Jeanmarie PlantMcShane, Janson Lamar A, MD 06/29/18 2332    Jeanmarie PlantMcShane, Yatzari Jonsson A, MD 06/29/18 [phone]

## 2018-06-29 NOTE — ED Notes (Signed)
Patient transported to CT 

## 2018-06-30 ENCOUNTER — Emergency Department: Payer: Medicare Other

## 2018-06-30 ENCOUNTER — Other Ambulatory Visit: Payer: Self-pay

## 2018-06-30 DIAGNOSIS — E860 Dehydration: Secondary | ICD-10-CM | POA: Diagnosis present

## 2018-06-30 DIAGNOSIS — G4733 Obstructive sleep apnea (adult) (pediatric): Secondary | ICD-10-CM | POA: Diagnosis present

## 2018-06-30 DIAGNOSIS — R161 Splenomegaly, not elsewhere classified: Secondary | ICD-10-CM | POA: Diagnosis present

## 2018-06-30 DIAGNOSIS — G9341 Metabolic encephalopathy: Secondary | ICD-10-CM | POA: Diagnosis not present

## 2018-06-30 DIAGNOSIS — E11610 Type 2 diabetes mellitus with diabetic neuropathic arthropathy: Secondary | ICD-10-CM | POA: Diagnosis present

## 2018-06-30 DIAGNOSIS — K851 Biliary acute pancreatitis without necrosis or infection: Secondary | ICD-10-CM | POA: Diagnosis present

## 2018-06-30 DIAGNOSIS — F419 Anxiety disorder, unspecified: Secondary | ICD-10-CM | POA: Diagnosis present

## 2018-06-30 DIAGNOSIS — K8050 Calculus of bile duct without cholangitis or cholecystitis without obstruction: Secondary | ICD-10-CM | POA: Diagnosis not present

## 2018-06-30 DIAGNOSIS — Z6833 Body mass index (BMI) 33.0-33.9, adult: Secondary | ICD-10-CM | POA: Diagnosis not present

## 2018-06-30 DIAGNOSIS — K831 Obstruction of bile duct: Secondary | ICD-10-CM | POA: Diagnosis not present

## 2018-06-30 DIAGNOSIS — N179 Acute kidney failure, unspecified: Secondary | ICD-10-CM | POA: Diagnosis present

## 2018-06-30 DIAGNOSIS — I361 Nonrheumatic tricuspid (valve) insufficiency: Secondary | ICD-10-CM | POA: Diagnosis not present

## 2018-06-30 DIAGNOSIS — R748 Abnormal levels of other serum enzymes: Secondary | ICD-10-CM | POA: Diagnosis not present

## 2018-06-30 DIAGNOSIS — K8071 Calculus of gallbladder and bile duct without cholecystitis with obstruction: Secondary | ICD-10-CM | POA: Diagnosis present

## 2018-06-30 DIAGNOSIS — R339 Retention of urine, unspecified: Secondary | ICD-10-CM | POA: Diagnosis not present

## 2018-06-30 DIAGNOSIS — K4090 Unilateral inguinal hernia, without obstruction or gangrene, not specified as recurrent: Secondary | ICD-10-CM | POA: Diagnosis present

## 2018-06-30 DIAGNOSIS — R112 Nausea with vomiting, unspecified: Secondary | ICD-10-CM | POA: Diagnosis present

## 2018-06-30 DIAGNOSIS — J9601 Acute respiratory failure with hypoxia: Secondary | ICD-10-CM | POA: Diagnosis not present

## 2018-06-30 DIAGNOSIS — E1165 Type 2 diabetes mellitus with hyperglycemia: Secondary | ICD-10-CM | POA: Diagnosis present

## 2018-06-30 DIAGNOSIS — J90 Pleural effusion, not elsewhere classified: Secondary | ICD-10-CM | POA: Diagnosis not present

## 2018-06-30 DIAGNOSIS — K859 Acute pancreatitis without necrosis or infection, unspecified: Secondary | ICD-10-CM | POA: Diagnosis present

## 2018-06-30 DIAGNOSIS — E669 Obesity, unspecified: Secondary | ICD-10-CM | POA: Diagnosis present

## 2018-06-30 DIAGNOSIS — E119 Type 2 diabetes mellitus without complications: Secondary | ICD-10-CM

## 2018-06-30 DIAGNOSIS — I252 Old myocardial infarction: Secondary | ICD-10-CM | POA: Diagnosis not present

## 2018-06-30 DIAGNOSIS — G9340 Encephalopathy, unspecified: Secondary | ICD-10-CM | POA: Diagnosis not present

## 2018-06-30 DIAGNOSIS — E876 Hypokalemia: Secondary | ICD-10-CM | POA: Diagnosis not present

## 2018-06-30 DIAGNOSIS — I10 Essential (primary) hypertension: Secondary | ICD-10-CM | POA: Diagnosis present

## 2018-06-30 DIAGNOSIS — Z20828 Contact with and (suspected) exposure to other viral communicable diseases: Secondary | ICD-10-CM | POA: Diagnosis present

## 2018-06-30 DIAGNOSIS — A0472 Enterocolitis due to Clostridium difficile, not specified as recurrent: Secondary | ICD-10-CM | POA: Diagnosis not present

## 2018-06-30 DIAGNOSIS — K8500 Idiopathic acute pancreatitis without necrosis or infection: Secondary | ICD-10-CM | POA: Diagnosis not present

## 2018-06-30 DIAGNOSIS — I2510 Atherosclerotic heart disease of native coronary artery without angina pectoris: Secondary | ICD-10-CM | POA: Diagnosis present

## 2018-06-30 DIAGNOSIS — M1990 Unspecified osteoarthritis, unspecified site: Secondary | ICD-10-CM | POA: Diagnosis present

## 2018-06-30 LAB — CBC
HCT: 37.8 % — ABNORMAL LOW (ref 39.0–52.0)
Hemoglobin: 12.6 g/dL — ABNORMAL LOW (ref 13.0–17.0)
MCH: 29.4 pg (ref 26.0–34.0)
MCHC: 33.3 g/dL (ref 30.0–36.0)
MCV: 88.1 fL (ref 80.0–100.0)
Platelets: 175 10*3/uL (ref 150–400)
RBC: 4.29 MIL/uL (ref 4.22–5.81)
RDW: 12.8 % (ref 11.5–15.5)
WBC: 7.4 10*3/uL (ref 4.0–10.5)
nRBC: 0 % (ref 0.0–0.2)

## 2018-06-30 LAB — COMPREHENSIVE METABOLIC PANEL
ALT: 444 U/L — ABNORMAL HIGH (ref 0–44)
AST: 611 U/L — ABNORMAL HIGH (ref 15–41)
Albumin: 3.9 g/dL (ref 3.5–5.0)
Alkaline Phosphatase: 119 U/L (ref 38–126)
Anion gap: 11 (ref 5–15)
BUN: 39 mg/dL — ABNORMAL HIGH (ref 8–23)
CO2: 25 mmol/L (ref 22–32)
Calcium: 9.1 mg/dL (ref 8.9–10.3)
Chloride: 107 mmol/L (ref 98–111)
Creatinine, Ser: 1.5 mg/dL — ABNORMAL HIGH (ref 0.61–1.24)
GFR calc Af Amer: 53 mL/min — ABNORMAL LOW (ref >60)
GFR calc non Af Amer: 46 mL/min — ABNORMAL LOW (ref >60)
Glucose, Bld: 321 mg/dL — ABNORMAL HIGH (ref 70–99)
Potassium: 3.8 mmol/L (ref 3.5–5.1)
Sodium: 143 mmol/L (ref 135–145)
Total Bilirubin: 2.4 mg/dL — ABNORMAL HIGH (ref 0.3–1.2)
Total Protein: 6.7 g/dL (ref 6.5–8.1)

## 2018-06-30 LAB — SARS CORONAVIRUS 2 BY RT PCR (HOSPITAL ORDER, PERFORMED IN ~~LOC~~ HOSPITAL LAB): SARS Coronavirus 2: NEGATIVE

## 2018-06-30 LAB — ABO/RH: ABO/RH(D): A POS

## 2018-06-30 LAB — GLUCOSE, CAPILLARY
Glucose-Capillary: 297 mg/dL — ABNORMAL HIGH (ref 70–99)
Glucose-Capillary: 328 mg/dL — ABNORMAL HIGH (ref 70–99)
Glucose-Capillary: 312 mg/dL — ABNORMAL HIGH (ref 70–99)
Glucose-Capillary: 291 mg/dL — ABNORMAL HIGH (ref 70–99)
Glucose-Capillary: 250 mg/dL — ABNORMAL HIGH (ref 70–99)

## 2018-06-30 LAB — LIPASE, BLOOD: Lipase: 1284 U/L — ABNORMAL HIGH (ref 11–51)

## 2018-06-30 MED ORDER — INSULIN ASPART 100 UNIT/ML ~~LOC~~ SOLN
0.0000 [IU] | Freq: Four times a day (QID) | SUBCUTANEOUS | Status: DC
  Administered 2018-06-30: 12:00:00 7 [IU] via SUBCUTANEOUS
  Administered 2018-06-30: 5 [IU] via SUBCUTANEOUS
  Filled 2018-06-30 (×2): qty 1

## 2018-06-30 MED ORDER — MORPHINE SULFATE (PF) 2 MG/ML IV SOLN
2.0000 mg | INTRAVENOUS | Status: DC | PRN
  Administered 2018-06-30: 2 mg via INTRAVENOUS
  Filled 2018-06-30: qty 1

## 2018-06-30 MED ORDER — ACETAMINOPHEN 325 MG PO TABS: 650.0000 mg | ORAL_TABLET | Freq: Four times a day (QID) | ORAL | Status: DC | PRN

## 2018-06-30 MED ORDER — ENOXAPARIN SODIUM 40 MG/0.4ML ~~LOC~~ SOLN
40.0000 mg | SUBCUTANEOUS | Status: DC
  Administered 2018-06-30: 40 mg via SUBCUTANEOUS
  Filled 2018-06-30: qty 0.4

## 2018-06-30 MED ORDER — MORPHINE SULFATE (PF) 2 MG/ML IV SOLN
2.0000 mg | INTRAVENOUS | Status: DC | PRN
  Administered 2018-06-30 – 2018-07-02 (×8): 2 mg via INTRAVENOUS
  Filled 2018-06-30 (×8): qty 1

## 2018-06-30 MED ORDER — INSULIN ASPART 100 UNIT/ML ~~LOC~~ SOLN
0.0000 [IU] | SUBCUTANEOUS | Status: DC
  Administered 2018-06-30: 7 [IU] via SUBCUTANEOUS
  Administered 2018-06-30: 3 [IU] via SUBCUTANEOUS
  Administered 2018-06-30: 5 [IU] via SUBCUTANEOUS
  Administered 2018-07-01: 3 [IU] via SUBCUTANEOUS
  Administered 2018-07-01: 5 [IU] via SUBCUTANEOUS
  Administered 2018-07-01 – 2018-07-02 (×5): 3 [IU] via SUBCUTANEOUS
  Administered 2018-07-02: 2 [IU] via SUBCUTANEOUS
  Administered 2018-07-02 – 2018-07-03 (×5): 3 [IU] via SUBCUTANEOUS
  Filled 2018-06-30 (×16): qty 1

## 2018-06-30 MED ORDER — ACETAMINOPHEN 650 MG RE SUPP: 650.0000 mg | Freq: Four times a day (QID) | RECTAL | Status: DC | PRN

## 2018-06-30 MED ORDER — LISINOPRIL 20 MG PO TABS
40.0000 mg | ORAL_TABLET | Freq: Every day | ORAL | Status: DC
  Administered 2018-06-30 – 2018-07-07 (×4): 40 mg via ORAL
  Filled 2018-06-30 (×4): qty 2

## 2018-06-30 MED ORDER — ONDANSETRON HCL 4 MG PO TABS: 4.0000 mg | ORAL_TABLET | Freq: Four times a day (QID) | ORAL | Status: DC | PRN

## 2018-06-30 MED ORDER — MORPHINE SULFATE (PF) 2 MG/ML IV SOLN
INTRAVENOUS | Status: AC
  Administered 2018-06-30: 15:00:00 2 mg via INTRAVENOUS
  Filled 2018-06-30: qty 1

## 2018-06-30 MED ORDER — SODIUM CHLORIDE 0.9 % IV SOLN
INTRAVENOUS | Status: AC
  Administered 2018-06-30 (×2): via INTRAVENOUS

## 2018-06-30 MED ORDER — ONDANSETRON HCL 4 MG/2ML IJ SOLN: 4.0000 mg | Freq: Four times a day (QID) | INTRAMUSCULAR | Status: DC | PRN

## 2018-06-30 MED ORDER — ATORVASTATIN CALCIUM 20 MG PO TABS: 80.0000 mg | ORAL_TABLET | Freq: Every day | ORAL | Status: DC

## 2018-06-30 MED ORDER — CARVEDILOL 12.5 MG PO TABS
12.5000 mg | ORAL_TABLET | Freq: Two times a day (BID) | ORAL | Status: DC
  Administered 2018-06-30 – 2018-07-07 (×10): 12.5 mg via ORAL
  Filled 2018-06-30 (×11): qty 1

## 2018-06-30 MED ORDER — AMLODIPINE BESYLATE 5 MG PO TABS
5.0000 mg | ORAL_TABLET | Freq: Every day | ORAL | Status: DC
  Administered 2018-06-30 – 2018-07-07 (×4): 5 mg via ORAL
  Filled 2018-06-30 (×4): qty 1

## 2018-06-30 MED ORDER — CITALOPRAM HYDROBROMIDE 20 MG PO TABS
20.0000 mg | ORAL_TABLET | Freq: Every day | ORAL | Status: DC
  Administered 2018-06-30 – 2018-07-07 (×4): 20 mg via ORAL
  Filled 2018-06-30 (×4): qty 1

## 2018-06-30 MED ORDER — LACTATED RINGERS IV SOLN
INTRAVENOUS | Status: DC
  Administered 2018-06-30 – 2018-07-01 (×5): via INTRAVENOUS

## 2018-06-30 MED ORDER — OXYCODONE HCL 5 MG PO TABS
5.0000 mg | ORAL_TABLET | ORAL | Status: DC | PRN
  Administered 2018-06-30 – 2018-07-03 (×11): 5 mg via ORAL
  Filled 2018-06-30 (×11): qty 1

## 2018-06-30 MED ORDER — FENTANYL CITRATE (PF) 100 MCG/2ML IJ SOLN
50.0000 ug | Freq: Once | INTRAMUSCULAR | Status: AC
  Administered 2018-06-30: 50 ug via INTRAVENOUS
  Filled 2018-06-30: qty 2

## 2018-06-30 MED ORDER — ASPIRIN EC 81 MG PO TBEC
81.0000 mg | DELAYED_RELEASE_TABLET | Freq: Every day | ORAL | Status: DC
  Administered 2018-06-30: 22:00:00 81 mg via ORAL
  Filled 2018-06-30: qty 1

## 2018-06-30 MED ORDER — INSULIN GLARGINE 100 UNIT/ML ~~LOC~~ SOLN
14.0000 [IU] | Freq: Every day | SUBCUTANEOUS | Status: DC
  Administered 2018-06-30: 14 [IU] via SUBCUTANEOUS
  Filled 2018-06-30 (×2): qty 0.14

## 2018-06-30 NOTE — ED Notes (Signed)
ED TO INPATIENT HANDOFF REPORT  ED Nurse Name and Phone #: Malyia Moro [phone]  S Name/Age/Gender Mark Bean 73 y.o. male Room/Bed: ED05A/ED05A  Code Status   Code Status: Prior  Home/SNF/Other Home Patient oriented to: self, place and situation Is this baseline? Yes   Triage Complete: Triage complete  Chief Complaint Chest Pain  Triage Note Pt c/o sudden onset of lefts sided chest pressure/pain with SOB and nausea x35 minutes ago. Pt has MI hx. Pt denies radiating pain.    Allergies No Known Allergies  Level of Care/Admitting Diagnosis ED Disposition    ED Disposition Condition [facility] Area: [place] [[id number]]  Level of Care: Med-Surg [16]  Covid Evaluation: Screening Protocol (No Symptoms)  Diagnosis: Pancreatitis [517616]  Admitting Physician: Lance Coon [[id number]]  Attending Physician: Lance Coon [id number]  Estimated length of stay: past midnight tomorrow  Certification:: I certify this patient will need inpatient services for at least 2 midnights  PT Class (Do Not Modify): Inpatient [101]  PT Acc Code (Do Not Modify): Private [1]       B Medical/Surgery History Past Medical History:  Diagnosis Date  . Anxiety   . Arthritis   . Coronary artery disease   . Diabetes mellitus without complication ([place])   . Exposure to [employer]   . Hypertension    dr Farley Ly    [id number]  . Kidney stones   . Myocardial infarction ([place])    silent mi  . Pancreatitis, acute    caused by medicaion  . Sleep apnea    c pap   Past Surgical History:  Procedure Laterality Date  . AMPUTATION Right 10/11/2012   Procedure: 2nd ray amputation, medial column fusion and ostectomy right foot;  Surgeon: Newt Minion, MD;  Location: [place];  Service: Orthopedics;  Laterality: Right;  . AMPUTATION Right 06/15/2013   Procedure: AMPUTATION DIGIT;  Surgeon: Newt Minion, MD;  Location: [place];  Service: Orthopedics;  Laterality: Right;  Right  3rd Toe Amputation  . AMPUTATION Right 12/03/2015   Procedure: AMPUTATION BELOW RIGHT KNEE;  Surgeon: Newt Minion, MD;  Location: [place];  Service: Orthopedics;  Laterality: Right;  . ANKLE FUSION Right 10/11/2012   Procedure: ARTHRODESIS ANKLE;  Surgeon: Newt Minion, MD;  Location: [place];  Service: Orthopedics;  Laterality: Right;  . APPLICATION OF WOUND VAC  12/03/2015  . CARDIAC CATHETERIZATION     2000  . CATARACT EXTRACTION W/PHACO Right 04/06/2017   Procedure: CATARACT EXTRACTION PHACO AND INTRAOCULAR LENS PLACEMENT (IOC);  Surgeon: Eulogio Bear, MD;  Location: [facility];  Service: Ophthalmology;  Laterality: Right;  [place] 01:01.5 AP% 13.1 CDE 8.07 Fluid pack lot # [id number]  . COLONOSCOPY    . CORONARY ANGIOPLASTY     stent  . CORONARY ARTERY BYPASS GRAFT     2000   1 stent  . EYE SURGERY Right    cataract   . HARDWARE REMOVAL Right 01/05/2013   Procedure: HARDWARE REMOVAL;  Surgeon: Newt Minion, MD;  Location: [place];  Service: Orthopedics;  Laterality: Right;  Right Foot Debridement, Placement antibiotic beads, remove screw  . I&D EXTREMITY Right 11/10/2012   Procedure: IRRIGATION AND DEBRIDEMENT EXTREMITY;  Surgeon: Newt Minion, MD;  Location: [place];  Service: Orthopedics;  Laterality: Right;  Irrigation and Debridement Right Foot Wound,  Antibiotic Beads and Wound VAC  . JOINT REPLACEMENT Left    knee  . REPLACEMENT TOTAL KNEE  Right 02/15/2011  . stents    . TONSILLECTOMY    . VASECTOMY       A IV Location/Drains/Wounds Patient Lines/Drains/Airways Status   Active Line/Drains/Airways    Name:   Placement date:   Placement time:   Site:   Days:   Peripheral IV 06/29/18 Right Hand   06/29/18    1830    Hand   1   Airway   04/06/17    0836     450   Incision (Closed) 04/06/17 Eye Right   04/06/17    0717     450          Intake/Output Last 24 hours  Intake/Output Summary (Last 24 hours) at 06/30/2018 0046 Last data filed at 06/30/2018 0030 Gross per 24 hour   Intake 500 ml  Output -  Net 500 ml    Labs/Imaging Results for orders placed or performed during the hospital encounter of 06/29/18 (from the past 48 hour(s))  Basic metabolic panel     Status: Abnormal   Collection Time: 06/29/18  7:52 PM  Result Value Ref Range   Sodium 138 135 - 145 mmol/L   Potassium 3.6 3.5 - 5.1 mmol/L   Chloride 105 98 - 111 mmol/L   CO2 26 22 - 32 mmol/L   Glucose, Bld 181 (H) 70 - 99 mg/dL   BUN 36 (H) 8 - 23 mg/dL   Creatinine, Ser 1.611.53 (H) 0.61 - 1.24 mg/dL   Calcium 9.6 8.9 - 09.610.3 mg/dL   GFR calc non Af Amer 44 (L) >60 mL/min   GFR calc Af Amer 52 (L) >60 mL/min   Anion gap 7 5 - 15    Comment: Performed at [facility], [address] [place], [place] [address]  CBC     Status: Abnormal   Collection Time: 06/29/18  7:52 PM  Result Value Ref Range   WBC 5.2 4.0 - 10.5 K/uL   RBC 4.28 4.22 - 5.81 MIL/uL   Hemoglobin 12.7 (L) 13.0 - 17.0 g/dL   HCT 98.137.6 (L) 19.139.0 - 47.852.0 %   MCV 87.9 80.0 - 100.0 fL   MCH 29.7 26.0 - 34.0 pg   MCHC 33.8 30.0 - 36.0 g/dL   RDW 29.513.0 62.111.5 - 30.815.5 %   Platelets 211 150 - 400 K/uL   nRBC 0.0 0.0 - 0.2 %    Comment: Performed at [facility], [address] [place], [place] [address]  Troponin I - ONCE - STAT     Status: None   Collection Time: 06/29/18  7:52 PM  Result Value Ref Range   Troponin I <0.03 <0.03 ng/mL    Comment: Performed at [facility], [address] [place], [place] [address]  Lipase, blood     Status: Abnormal   Collection Time: 06/29/18  9:02 PM  Result Value Ref Range   Lipase 4,090 (H) 11 - 51 U/L    Comment: RESULT CONFIRMED BY MANUAL DILUTION...[facility] Performed at [facility], [address] [place], [place] [address]   Hepatic function panel     Status: Abnormal   Collection Time: 06/29/18  9:02 PM  Result Value Ref Range   Total Protein 7.1 6.5 - 8.1 g/dL   Albumin 4.2 3.5 - 5.0 g/dL   AST 324182 (H) 15 - 41 U/L   ALT 102 (H) 0 - 44 U/L    Alkaline Phosphatase 85 38 - 126 U/L   Total Bilirubin 0.9  0.3 - 1.2 mg/dL   Bilirubin, Direct 0.4 (H) 0.0 - 0.2 mg/dL   Indirect Bilirubin 0.5 0.3 - 0.9 mg/dL    Comment: Performed at [facility], [address] [place], [place] [address]  Troponin I - Once     Status: None   Collection Time: 06/29/18 10:53 PM  Result Value Ref Range   Troponin I <0.03 <0.03 ng/mL    Comment: Performed at [facility], [address] [place], [place] [address]   Ct Abdomen Pelvis W Contrast  Result Date: 06/29/2018 CLINICAL DATA:  Epigastric abdominal pain EXAM: CT ABDOMEN AND PELVIS WITH CONTRAST TECHNIQUE: Multidetector CT imaging of the abdomen and pelvis was performed using the standard protocol following bolus administration of intravenous contrast. CONTRAST:  75mL OMNIPAQUE IOHEXOL 300 MG/ML  SOLN COMPARISON:  CT dated 07/23/2016 FINDINGS: Lower chest: No acute abnormality. Hepatobiliary: There is likely underlying hepatic steatosis. Multiple gallstones are noted. There may be a gallstone at the distal CBD. There is mild intrahepatic and extrahepatic biliary ductal dilatation. Pancreas: There is diffuse peripancreatic fat stranding. The pancreas enhances uniformly. There is no well-formed drainable fluid collection at this time. Spleen: The spleen is borderline enlarged. Adrenals/Urinary Tract: Adrenal glands are unremarkable. Kidneys are normal, without renal calculi, focal lesion, or hydronephrosis. Bladder is unremarkable. Stomach/Bowel: There is a moderate-sized hiatal hernia. There is scattered colonic diverticula without CT evidence of diverticulitis. There is a normal appendix in the right lower quadrant. There is no evidence of a high-grade small bowel obstruction. Vascular/Lymphatic: Aortic atherosclerosis. No enlarged abdominal or pelvic lymph nodes. There is a patent right common iliac artery stent. The splenic artery and splenic vein are patent. Reproductive: The prostate gland  is mildly enlarged. Other: There is stable subcutaneous densities involving the low anterior abdominal wall. These may represent injection granulomas. There is a fat containing left inguinal hernia. Musculoskeletal: No acute or significant osseous findings. IMPRESSION: 1. Findings consistent with acute uncomplicated pancreatitis. 2. Findings suspicious for choledocholithiasis with a small stone at the distal CBD. 3. Cholelithiasis. There is mild intrahepatic and extrahepatic biliary ductal dilatation. There is no CT evidence of acute cholecystitis. 4. Borderline splenomegaly. 5.  Aortic Atherosclerosis (ICD10-I70.0). Electronically Signed   By: Katherine Mantlehristopher  Green M.D.   On: 06/29/2018 22:42   Dg Chest Port 1 View  Result Date: 06/29/2018 CLINICAL DATA:  Chest pain EXAM: PORTABLE CHEST 1 VIEW COMPARISON:  12/15/2017 FINDINGS: The heart size is stable. The lung volumes are low. The patient is status post prior median sternotomy. There is no acute osseous abnormality. No large pleural effusion or pneumothorax. There are degenerative changes of both glenohumeral joints. IMPRESSION: No active disease. Electronically Signed   By: Katherine Mantlehristopher  Green M.D.   On: 06/29/2018 20:34    Pending Labs Unresulted Labs (From admission, onward)    Start     Ordered   06/30/18 0043  Novel Coronavirus,NAA,(SEND-OUT TO REF LAB - TAT 24-48 hrs); Hosp Order  (Asymptomatic Patients Labs)  ONCE - STAT,   STAT    Question:  Rule Out  Answer:  Yes   06/30/18 0042   Signed and Held  CBC  (enoxaparin (LOVENOX)    CrCl >/= 30 ml/min)  Once,   R    Comments: Baseline for enoxaparin therapy IF NOT ALREADY DRAWN.  Notify MD if PLT < 100 K.    Signed and Held   Signed and Held  Creatinine, serum  (enoxaparin (LOVENOX)    CrCl >/= 30 ml/min)  Once,   R    Comments: Baseline for enoxaparin therapy IF NOT ALREADY DRAWN.    Signed and Held   Signed and Held  Creatinine, serum  (enoxaparin (LOVENOX)    CrCl >/= 30 ml/min)  Weekly,   R     Comments: while on enoxaparin therapy    Signed and Held   Signed and Held  CBC  Tomorrow morning,   R     Signed and Held   Signed and Held  Comprehensive metabolic panel  Tomorrow morning,   R     Signed and Held   Signed and Held  Lipase, blood  Tomorrow morning,   R     Signed and Held          Vitals/Pain Today's Vitals   06/29/18 1946 06/29/18 2149 06/29/18 2252 06/30/18 0030  BP: (!) 132/58 130/64 (!) 173/79 (!) 176/69  Pulse: 72 73 78 77  Resp: 18 10 (!) 23 (!) 21  Temp: 98 F (36.7 C)     TempSrc: Oral     SpO2: 98% 96% 99% 100%    Isolation Precautions No active isolations  Medications Medications  iohexol (OMNIPAQUE) 240 MG/ML injection 50 mL (50 mLs Oral Canceled Entry 06/29/18 2206)  0.9 %  sodium chloride infusion ( Intravenous New Bag/Given 06/29/18 2108)  alum & mag hydroxide-simeth (MAALOX/MYLANTA) 200-200-20 MG/5ML suspension 30 mL (30 mLs Oral Given 06/29/18 2103)    And  lidocaine (XYLOCAINE) 2 % viscous mouth solution 15 mL (15 mLs Oral Given 06/29/18 2103)  ondansetron (ZOFRAN) injection 4 mg (4 mg Intravenous Given 06/29/18 2126)  fentaNYL (SUBLIMAZE) injection 50 mcg (25 mcg Intravenous Given 06/29/18 2243)  iohexol (OMNIPAQUE) 300 MG/ML solution 100 mL (75 mLs Intravenous Contrast Given 06/29/18 2221)  fentaNYL (SUBLIMAZE) injection 50 mcg (50 mcg Intravenous Given 06/29/18 2320)  sodium chloride 0.9 % bolus 500 mL (0 mLs Intravenous Stopped 06/30/18 0030)  fentaNYL (SUBLIMAZE) injection 50 mcg (50 mcg Intravenous Given 06/30/18 0031)    Mobility walks with device Low fall risk   Focused Assessments GI: 1 episode of diarrhea. No vomiting in last 2 hours    R Recommendations: See Admitting Provider Note  Report given to:   Additional Notes: pt has prosthetic leg

## 2018-06-30 NOTE — ED Notes (Signed)
MD Jannifer Franklin informed pt that he is allowed to stand to side of bed and have ice chips. PT helped to side of bed by this RN. PT requests that RN leave pt and go get him ice chips. RN informed pt that ice chips would be gotten once pt was back in bed, because RN did not want pt to fall out of bed. PT states you're " gonna worry the damn shit out of me". PT instructed to not curse at the staff. PT apologized. PT helped back into bed and given ice chips.

## 2018-06-30 NOTE — Progress Notes (Signed)
[facility] [place] [facility]   PATIENT NAME: Mark KaiserRoger Okey    MR#:  [id number]  DATE OF BIRTH:  07/23/1945  SUBJECTIVE:   Patient came in with significant abdominal pain found to have acute pancreatitis. He is still having excruciating pain and tossing and turning in the bed. No vomiting. REVIEW OF SYSTEMS:   Review of Systems  Constitutional: Negative for chills, fever and weight loss.  HENT: Negative for ear discharge, ear pain and nosebleeds.   Eyes: Negative for blurred vision, pain and discharge.  Respiratory: Negative for sputum production, shortness of breath, wheezing and stridor.   Cardiovascular: Negative for chest pain, palpitations, orthopnea and PND.  Gastrointestinal: Positive for abdominal pain and nausea. Negative for diarrhea and vomiting.  Genitourinary: Negative for frequency and urgency.  Musculoskeletal: Negative for back pain and joint pain.  Neurological: Negative for sensory change, speech change, focal weakness and weakness.  Psychiatric/Behavioral: Negative for depression and hallucinations. The patient is not nervous/anxious.    Tolerating Diet:no   DRUG ALLERGIES:  No Known Allergies  VITALS:  Blood pressure (!) 176/83, pulse 72, temperature 98.5 F (36.9 C), temperature source Oral, resp. rate 20, height 5' 7.99" (1.727 m), weight 96.6 kg, SpO2 94 %.  PHYSICAL EXAMINATION:   Physical Exam  GENERAL:  73 y.o.-year-old patient lying in the bed with no acute distress. Obese EYES: Pupils equal, round, reactive to light and accommodation. No scleral icterus. Extraocular muscles intact.  HEENT: Head atraumatic, normocephalic. Oropharynx and nasopharynx clear.  NECK:  Supple, no jugular venous distention. No thyroid enlargement, no tenderness.  LUNGS: Normal breath sounds bilaterally, no wheezing, rales, rhonchi. No use of accessory muscles of respiration.  CARDIOVASCULAR: S1, S2 normal. No murmurs, rubs, or gallops.   ABDOMEN: Soft, nontender, nondistended. Bowel sounds present. No organomegaly or mass.  EXTREMITIES: No cyanosis, clubbing or edema b/l.   BKA stump stable NEUROLOGIC: Cranial nerves II through XII are intact. No focal Motor or sensory deficits b/l.   PSYCHIATRIC:  patient is alert and oriented x 3.  SKIN: No obvious rash, lesion, or ulcer.   LABORATORY PANEL:  CBC Recent Labs  Lab 06/30/18 0512  WBC 7.4  HGB 12.6*  HCT 37.8*  PLT 175    Chemistries  Recent Labs  Lab 06/30/18 0512  NA 143  K 3.8  CL 107  CO2 25  GLUCOSE 321*  BUN 39*  CREATININE 1.50*  CALCIUM 9.1  AST 611*  ALT 444*  ALKPHOS 119  BILITOT 2.4*   Cardiac Enzymes Recent Labs  Lab 06/29/18 2253  TROPONINI <0.03   RADIOLOGY:  Ct Abdomen Pelvis W Contrast  Result Date: 06/29/2018 CLINICAL DATA:  Epigastric abdominal pain EXAM: CT ABDOMEN AND PELVIS WITH CONTRAST TECHNIQUE: Multidetector CT imaging of the abdomen and pelvis was performed using the standard protocol following bolus administration of intravenous contrast. CONTRAST:  75mL OMNIPAQUE IOHEXOL 300 MG/ML  SOLN COMPARISON:  CT dated 07/23/2016 FINDINGS: Lower chest: No acute abnormality. Hepatobiliary: There is likely underlying hepatic steatosis. Multiple gallstones are noted. There may be a gallstone at the distal CBD. There is mild intrahepatic and extrahepatic biliary ductal dilatation. Pancreas: There is diffuse peripancreatic fat stranding. The pancreas enhances uniformly. There is no well-formed drainable fluid collection at this time. Spleen: The spleen is borderline enlarged. Adrenals/Urinary Tract: Adrenal glands are unremarkable. Kidneys are normal, without renal calculi, focal lesion, or hydronephrosis. Bladder is unremarkable. Stomach/Bowel: There is a moderate-sized hiatal hernia. There is scattered colonic diverticula without  CT evidence of diverticulitis. There is a normal appendix in the right lower quadrant. There is no evidence of a  high-grade small bowel obstruction. Vascular/Lymphatic: Aortic atherosclerosis. No enlarged abdominal or pelvic lymph nodes. There is a patent right common iliac artery stent. The splenic artery and splenic vein are patent. Reproductive: The prostate gland is mildly enlarged. Other: There is stable subcutaneous densities involving the low anterior abdominal wall. These may represent injection granulomas. There is a fat containing left inguinal hernia. Musculoskeletal: No acute or significant osseous findings. IMPRESSION: 1. Findings consistent with acute uncomplicated pancreatitis. 2. Findings suspicious for choledocholithiasis with a small stone at the distal CBD. 3. Cholelithiasis. There is mild intrahepatic and extrahepatic biliary ductal dilatation. There is no CT evidence of acute cholecystitis. 4. Borderline splenomegaly. 5.  Aortic Atherosclerosis (ICD10-I70.0). Electronically Signed   By: Constance Holster M.D.   On: 06/29/2018 22:42   Dg Chest Port 1 View  Result Date: 06/29/2018 CLINICAL DATA:  Chest pain EXAM: PORTABLE CHEST 1 VIEW COMPARISON:  12/15/2017 FINDINGS: The heart size is stable. The lung volumes are low. The patient is status post prior median sternotomy. There is no acute osseous abnormality. No large pleural effusion or pneumothorax. There are degenerative changes of both glenohumeral joints. IMPRESSION: No active disease. Electronically Signed   By: Constance Holster M.D.   On: 06/29/2018 20:34   US Abdomen Limited Ruq  Result Date: 06/30/2018 CLINICAL DATA:  73 year old male with abdominal pain, pancreatitis. EXAM: ULTRASOUND ABDOMEN LIMITED RIGHT UPPER QUADRANT COMPARISON:  CT Abdomen and Pelvis 06/29/2018. FINDINGS: Gallbladder: Positive for small echogenic gallstones, individually estimated at 7 millimeters. Borderline to mild gallbladder wall thickening at 3 millimeters. No pericholecystic fluid. No sonographic Murphy sign elicited. Common bile duct: Diameter: 3-6  millimeters, within normal limits. Liver: No focal lesion identified. Within normal limits in parenchymal echogenicity. Portal vein is patent on color Doppler imaging with normal direction of blood flow towards the liver. Other findings: Negative visible right kidney. IMPRESSION: 1. Cholelithiasis but no strong evidence of acute cholecystitis. 2. CBD within normal limits. Electronically Signed   By: Genevie Ann M.D.   On: 06/30/2018 01:27   ASSESSMENT AND PLAN:   Ansar Skoda  is a 74 y.o. male who presents with anxiety, arthritis, CAD, diabetes  Presents the ED with acute onset of severe abdominal pain rating to his back.  This started earlier today.  He has significant associated nausea with vomiting.  On evaluation here in the ED is found to have a lipase of 4000, with Choledocholithiasis seen on CT imaging  1. Pancreatitis suspected gallstone induced with  choledocholithiasis -IV fluids, PO with ice chips and meds -lipase 4000--- 1200 -increasing LFTs. Hold statins -patient will need ERCP. Dr. Vicente Males as outpatient. Trying to make arrangements for ERCP to be done over the weekend -IV morphine PRN  2. Acute cholelithiasis -patient seen by Dr. Lysle Pearl--- follow recommendations  3. Type II diabetes sliding scale insulin  4. Hypertension continue home meds  5. CAD continue home meds  6. DVT prophylaxis subcu  SCD  Spoke with patient's wife on the phone today and updated her  CODE STATUS: full  DVT Prophylaxis: scd-- pt pending ERCP and GB surgery  TOTAL TIME TAKING CARE OF THIS PATIENT: 30* minutes.  >50% time spent on counselling and coordination of care  POSSIBLE D/C IN 1 to 2* DAYS, DEPENDING ON CLINICAL CONDITION.  Note: This dictation was prepared with Dragon dictation along with smaller phrase technology. Any transcriptional errors  that result from this process are unintentional.  Enedina FinnerSona Korey Arroyo M.D on 06/30/2018 at 1:59 PM  Between 7am to 6pm - Pager - [phone]  After 6pm go to  [email or web address] - Social research officer, [email or web address] [email or web address] [facility]  [facility]  Office  [phone]  CC: [facility] care physician; [facility] Sherry RuffingRoger D Westerman, male   DOB: 01/06/1946, 73 y.o.   MRN: [id number]

## 2018-06-30 NOTE — Progress Notes (Signed)
Inpatient Diabetes Program Recommendations  AACE/ADA: New Consensus Statement on Inpatient Glycemic Control (2015)  Target Ranges:  Prepandial:   less than 140 mg/dL      Peak postprandial:   less than 180 mg/dL (1-2 hours)      Critically ill patients:  140 - 180 mg/dL   Results for HUSAM, HOHN (MRN [id number]) as of 06/30/2018 09:22  Ref. Range 06/30/2018 05:43  Glucose-Capillary Latest Ref Range: 70 - 99 mg/dL 297 (H)  5 units NOVOLOG     Admit: Pancreatitis/ Choledocholithiasis -CBD stone seen on CT imaging  History: DM  Home DM Meds: Lantus 28 units QHS        Novolog 20 units BID          Victoza 1.2 mg QHS   Current: Novolog Sensitive Correction Scale/ SSI (0-9 units) Q6 hours     MD- Please consider the following in-hospital insulin adjustments:  1. Change Novolog SSI to Q4 hours while patient remains NPO (currently ordered Q6 hours)   2. Start portion of pt's home dose Lantus: Recommend Lantus 14 units QHS to start (50% home dose to start)      --Will follow patient during hospitalization--  Wyn Quaker RN, MSN, CDE Diabetes Coordinator Inpatient Glycemic Control Team Team Pager: [phone] (8a-5p)

## 2018-06-30 NOTE — Progress Notes (Signed)
Notified Dr. Posey Pronto that patient does not have anything ordered IV for pain. Order received 2mg  Morphine IV Q4 hr prn

## 2018-06-30 NOTE — Progress Notes (Signed)
Checked with patient about wearing CPAP machine tonight. Pt states that he is unable to wear CPAP because he cannot sleep with it on his face. Pt refused to wear tonight while in hospital.

## 2018-06-30 NOTE — ED Notes (Signed)
PT requested to get out of bed. This RN told pt no bc he has had pain medicine. PT states that this RN is mean.

## 2018-06-30 NOTE — Consult Note (Addendum)
Mark Bean , MD [address], [address], [place], [place], [address] [address], [address], [place], [place], [address] Phone: [phone]  Fax: [phone]  Consultation  Referring Provider:    Dr Mark HahnWillis  Primary Care Physician:  [facility] Primary [facility]ologist:  None          Reason for Consultation:     Mark RayGall stone pancreatitis   Date of Admission:  06/29/2018 Date of Consultation:  06/30/2018         HPI:   Mark Bean is a 73 y.o. male presented to the ER earlier this morning with abdominal pain. Lipase was 4090. CT scan of the abdomen demonstrated pancreatitis, choledocholithiasis and a small stone in distal CBD.Cr 1.5, AST 611, ALT 444, T bil 2.4  Hb 12.7 grams.    The pain- chest  began all of a sudden yesterday along with nausea and vomiting. felt like he was having a heart attack. Denies any fevers, pain continues but seems to be a bit better, no similar episode in the past.No new medications, no OTC meds. Not a smoker, does nto drink any alcohol. No prior episodes of pancreatitis.   Past Medical History:  Diagnosis Date  . Anxiety   . Arthritis   . Coronary artery disease   . Diabetes mellitus without complication (HCC)   . Exposure to [employer] Orange   . Hypertension    dr Mark [place]    [id number]  . Kidney stones   . Myocardial infarction (HCC)    silent mi  . Pancreatitis, acute    caused by medicaion  . Sleep apnea    c pap    Past Surgical History:  Procedure Laterality Date  . AMPUTATION Right 10/11/2012   Procedure: 2nd Bean amputation, medial column fusion and ostectomy right foot;  Surgeon: Nadara MustardMarcus V Duda, MD;  Location: [facility] OR;  Service: Orthopedics;  Laterality: Right;  . AMPUTATION Right 06/15/2013   Procedure: AMPUTATION DIGIT;  Surgeon: Nadara MustardMarcus Duda V, MD;  Location: [facility] OR;  Service: Orthopedics;  Laterality: Right;  Right 3rd Toe Amputation  . AMPUTATION Right 12/03/2015   Procedure: AMPUTATION BELOW RIGHT KNEE;  Surgeon: Nadara MustardMarcus  V Duda, MD;  Location: [facility] OR;  Service: Orthopedics;  Laterality: Right;  . ANKLE FUSION Right 10/11/2012   Procedure: ARTHRODESIS ANKLE;  Surgeon: Nadara MustardMarcus V Duda, MD;  [facility];  Service: Orthopedics;  Laterality: Right;  . APPLICATION OF WOUND VAC  12/03/2015  . CARDIAC CATHETERIZATION     2000  . CATARACT EXTRACTION W/PHACO Right 04/06/2017   Procedure: CATARACT EXTRACTION PHACO AND INTRAOCULAR LENS PLACEMENT (IOC);  Surgeon: Nevada CraneKing, Bradley Mark, MD;  Location: [facility] ORS;  Service: Ophthalmology;  Laterality: Right;  US 01:01.5 AP% 13.1 CDE 8.07 Fluid pack lot # [id number] H  . COLONOSCOPY    . CORONARY ANGIOPLASTY     stent  . CORONARY ARTERY BYPASS GRAFT     2000   1 stent  . EYE SURGERY Right    cataract   . HARDWARE REMOVAL Right 01/05/2013   Procedure: HARDWARE REMOVAL;  Surgeon: Nadara MustardMarcus V Duda, MD;  [facility];  Service: Orthopedics;  Laterality: Right;  Right Foot Debridement, Placement antibiotic beads, remove screw  . I&D EXTREMITY Right 11/10/2012   Procedure: IRRIGATION AND DEBRIDEMENT EXTREMITY;  Surgeon: Nadara MustardMarcus V Duda, MD;  Location: [facility];  Service: Orthopedics;  Laterality: Right;  Irrigation and Debridement Right Foot Wound,  Antibiotic Beads and Wound VAC  . JOINT REPLACEMENT  Left    knee  . REPLACEMENT TOTAL KNEE Right 02/15/2011  . stents    . TONSILLECTOMY    . VASECTOMY      Prior to Admission medications   Medication Sig Start Date End Date Taking? Authorizing Provider  amLODipine (NORVASC) 10 MG tablet Take 5 mg by mouth daily.  11/25/17 11/25/18 Yes [provider]  aspirin EC 81 MG tablet Take 81 mg by mouth at bedtime.    Yes [provider]  atorvastatin (LIPITOR) 80 MG tablet Take 80 mg by mouth daily.   Yes [provider]  carvedilol (COREG) 12.5 MG tablet Take 12.5 mg by mouth 2 (two) times daily with a meal.   Yes [provider]  chlorthalidone (HYGROTON) 50 MG tablet Take 50 mg by mouth daily.    Yes  [provider]  cholecalciferol (VITAMIN D) 1000 units tablet Take 2,000 Units by mouth daily.   Yes [provider]  citalopram (CELEXA) 40 MG tablet Take 20 mg by mouth daily.   Yes [provider]  cyanocobalamin 1000 MCG tablet Take 1,000 mcg by mouth daily.    Yes [provider]  gabapentin (NEURONTIN) 100 MG capsule TAKE 1 CAPSULE BY MOUTH THREE TIMES A DAY Patient taking differently: Take 100-200 mg by mouth 2 (two) times daily. 100mg  in the morning and 200mg  at bedtime 01/03/17  Yes Newt Minion, MD  insulin aspart (NOVOLOG) 100 unit/mL injection Inject 20 Units into the skin 2 (two) times a day.    Yes [provider]  insulin glargine (LANTUS) 100 UNIT/ML injection Inject 28 Units into the skin at bedtime.    Yes [provider]  liraglutide (VICTOZA) 18 MG/3ML SOPN Inject 1.2 mg into the skin at bedtime.   Yes [provider]  lisinopril (PRINIVIL,ZESTRIL) 40 MG tablet Take 40 mg by mouth daily.   Yes [provider]  Melatonin 10 MG TABS Take 10 mg by mouth at bedtime.    Yes [provider]  Omega-3 Fatty Acids (FISH OIL) 1200 MG CAPS Take 1,200 mg by mouth daily.    Yes [provider]    History reviewed. No pertinent family history.   Social History   Tobacco Use  . Smoking status: Never Smoker  . Smokeless tobacco: Never Used  Substance Use Topics  . Alcohol use: Yes    Alcohol/week: 12.0 standard drinks    Types: 12 Cans of beer per week  . Drug use: No    Allergies as of 06/29/2018  . (No Known Allergies)    Review of Systems:    All systems reviewed and negative except where noted in HPI.   Physical Exam:  Vital signs in last 24 hours: Temp:  [98 F (36.7 C)-99.3 F (37.4 C)] 98.5 F (36.9 C) (06/12 0825) Pulse Rate:  [72-78] 72 (06/12 0825) Resp:  [10-23] 20 (06/12 0825) BP: (130-179)/(58-83) 176/83 (06/12 0825) SpO2:  [94 %-100 %] 94 % (06/12 0825) Weight:   [96.6 kg] 96.6 kg (06/12 0200) Last BM Date: 06/29/18 General:   Pleasant, cooperative in NAD Head:  Normocephalic and atraumatic. Eyes:   No icterus.   Conjunctiva pink. PERRLA. Ears:  Normal auditory acuity. Neck:  Supple; no masses or thyroidomegaly Lungs:central sternotomy scar Respirations even and unlabored. Lungs clear to auscultation bilaterally.   No wheezes, crackles, or rhonchi.  Heart:  Regular rate and rhythm;  Without murmur, clicks, rubs or gallops Abdomen:  Soft, nondistended, mild rt UQ  tenderness Normal bowel sounds. No appreciable masses or hepatomegaly.  No rebound or guarding.  Neurologic:  Alert and oriented x3;  grossly normal neurologically. Skin:  Intact without significant lesions or rashes. Cervical Nodes:  No significant cervical adenopathy. Psych:  Alert and cooperative. Normal affect.  Rt BKA  LAB RESULTS: Recent Labs    06/29/18 1952 06/30/18 0512  WBC 5.2 7.4  HGB 12.7* 12.6*  HCT 37.6* 37.8*  PLT 211 175   BMET Recent Labs    06/29/18 1952 06/30/18 0512  NA 138 143  K 3.6 3.8  CL 105 107  CO2 26 25  GLUCOSE 181* 321*  BUN 36* 39*  CREATININE 1.53* 1.50*  CALCIUM 9.6 9.1   LFT Recent Labs    06/29/18 2102 06/30/18 0512  PROT 7.1 6.7  ALBUMIN 4.2 3.9  AST 182* 611*  ALT 102* 444*  ALKPHOS 85 119  BILITOT 0.9 2.4*  BILIDIR 0.4*  --   IBILI 0.5  --    PT/INR No results for input(s): LABPROT, INR in the last 72 hours.  STUDIES: Ct Abdomen Pelvis W Contrast  Result Date: 06/29/2018 CLINICAL DATA:  Epigastric abdominal pain EXAM: CT ABDOMEN AND PELVIS WITH CONTRAST TECHNIQUE: Multidetector CT imaging of the abdomen and pelvis was performed using the standard protocol following bolus administration of intravenous contrast. CONTRAST:  75mL OMNIPAQUE IOHEXOL 300 MG/ML  SOLN COMPARISON:  CT dated 07/23/2016 FINDINGS: Lower chest: No acute abnormality. Hepatobiliary: There is likely underlying hepatic steatosis. Multiple gallstones are  noted. There may be a gallstone at the distal CBD. There is mild intrahepatic and extrahepatic biliary ductal dilatation. Pancreas: There is diffuse peripancreatic fat stranding. The pancreas enhances uniformly. There is no well-formed drainable fluid collection at this time. Spleen: The spleen is borderline enlarged. Adrenals/Urinary Tract: Adrenal glands are unremarkable. Kidneys are normal, without renal calculi, focal lesion, or hydronephrosis. Bladder is unremarkable. Stomach/Bowel: There is a moderate-sized hiatal hernia. There is scattered colonic diverticula without CT evidence of diverticulitis. There is a normal appendix in the right lower quadrant. There is no evidence of a high-grade small bowel obstruction. Vascular/Lymphatic: Aortic atherosclerosis. No enlarged abdominal or pelvic lymph nodes. There is a patent right common iliac artery stent. The splenic artery and splenic vein are patent. Reproductive: The prostate gland is mildly enlarged. Other: There is stable subcutaneous densities involving the low anterior abdominal wall. These may represent injection granulomas. There is a fat containing left inguinal hernia. Musculoskeletal: No acute or significant osseous findings. IMPRESSION: 1. Findings consistent with acute uncomplicated pancreatitis. 2. Findings suspicious for choledocholithiasis with a small stone at the distal CBD. 3. Cholelithiasis. There is mild intrahepatic and extrahepatic biliary ductal dilatation. There is no CT evidence of acute cholecystitis. 4. Borderline splenomegaly. 5.  Aortic Atherosclerosis (ICD10-I70.0). Electronically Signed   By: Katherine Mantlehristopher  Green M.D.   On: 06/29/2018 22:42   Dg Chest Port 1 View  Result Date: 06/29/2018 CLINICAL DATA:  Chest pain EXAM: PORTABLE CHEST 1 VIEW COMPARISON:  12/15/2017 FINDINGS: The heart size is stable. The lung volumes are low. The patient is status post prior median sternotomy. There is no acute osseous abnormality. No large  pleural effusion or pneumothorax. There are degenerative changes of both glenohumeral joints. IMPRESSION: No active disease. Electronically Signed   By: Katherine Mantlehristopher  Green M.D.   On: 06/29/2018 20:34   [place] Abdomen Limited Ruq  Result Date: 06/30/2018 CLINICAL DATA:  73 year old male with abdominal pain, pancreatitis. EXAM: ULTRASOUND ABDOMEN LIMITED RIGHT UPPER QUADRANT COMPARISON:  CT Abdomen and Pelvis 06/29/2018. FINDINGS: Gallbladder: Positive for small echogenic gallstones, individually estimated at 7 millimeters. Borderline to mild gallbladder wall thickening at 3 millimeters. No pericholecystic fluid. No sonographic Murphy sign elicited. Common bile duct: Diameter: 3-6 millimeters, within normal limits. Liver: No focal lesion identified. Within normal limits in parenchymal echogenicity. Portal vein is patent on color Doppler imaging with normal direction of blood flow towards the liver. Other findings: Negative visible right kidney. IMPRESSION: 1. Cholelithiasis but no strong evidence of acute cholecystitis. 2. CBD within normal limits. Electronically Signed   By: Odessa FlemingH  Hall M.D.   On: 06/30/2018 01:27      Impression / Plan:   Mark Bean is a 73 y.o. y/o male admitted with abdominal pain and features suggestive of biliary pancreatitis. Choledocholithiasis seen on CT scan .   Plan  1. IV ringer lactate is fluid of choice 2. Can commence on clears when he feels he is ready  3. ERCP either tomorrow or Sunday - Dr Servando SnareWohl is aware and will inform endo of timing 4. Cholecystectomy would be needed : timing to be determined by surgery.   I have discussed alternative options, risks & benefits,  which include, but are not limited to, bleeding, infection, perforation,respiratory complication  drug reaction, pancreatitis & death.  The patient agrees with this plan & written consent will be obtained.    Thank you for involving me in the care of this patient.      LOS: 0 days   Mark MoodKiran Bauer Ausborn, MD   06/30/2018, 9:15 AM

## 2018-06-30 NOTE — Progress Notes (Signed)
Valet delivered a phone in a red case with a white charger to nurses's station. This Probation officer delivered phone to the patient in room 138.

## 2018-06-30 NOTE — Consult Note (Addendum)
Subjective:   CC: Gallstone pancreatitis  HPI:  Mark Bean is a 73 y.o. male who is consulted by Posey Pronto for evaluation of above cc.  Symptoms were first noted 1 days ago. Pain is "worst he has ever experienced" epigastric, nonradiating, associated with some nausea, exacerbated by nothing specific.  ED work-up revealed pancreatitis and cholelithiasis but no evidence of acute cholecystitis.  Have elevated bilirubin levels     Past Medical History:  has a past medical history of Anxiety, Arthritis, Coronary artery disease, Diabetes mellitus without complication ([place]), Exposure to Agent Orange, Hypertension, Kidney stones, Myocardial infarction [facility]), Pancreatitis, acute, and Sleep apnea.  Past Surgical History:  has a past surgical history that includes Cardiac catheterization; Coronary artery bypass graft; Amputation (Right, 10/11/2012); Ankle Fusion (Right, 10/11/2012); Eye surgery (Right); Joint replacement (Left); Colonoscopy; Vasectomy; I&D extremity (Right, 11/10/2012); Hardware Removal (Right, 01/05/2013); Tonsillectomy; stents; Amputation (Right, 06/15/2013); Replacement total knee (Right, 02/15/2011); Amputation (Right, 12/03/2015); Application if wound vac (12/03/2015); Coronary angioplasty; and Cataract extraction w/PHACO (Right, 04/06/2017).  Family History: reviewed and non-contributory to CC  Social History: reports rare alcohol use but noted in system about 12drinks/wk.  Current Medications:  Medications Prior to Admission  Medication Sig Dispense Refill  . amLODipine (NORVASC) 10 MG tablet Take 5 mg by mouth daily.     [name] aspirin EC 81 MG tablet Take 81 mg by mouth at bedtime.     [name] atorvastatin (LIPITOR) 80 MG tablet Take 80 mg by mouth daily.    . carvedilol (COREG) 12.5 MG tablet Take 12.5 mg by mouth 2 (two) times daily with a meal.    . chlorthalidone (HYGROTON) 50 MG tablet Take 50 mg by mouth daily.     . cholecalciferol (VITAMIN D) 1000 units tablet Take 2,000 Units by  mouth daily.    . citalopram (CELEXA) 40 MG tablet Take 20 mg by mouth daily.    . cyanocobalamin 1000 MCG tablet Take 1,000 mcg by mouth daily.     [name] gabapentin (NEURONTIN) 100 MG capsule TAKE 1 CAPSULE BY MOUTH THREE TIMES A DAY (Patient taking differently: Take 100-200 mg by mouth 2 (two) times daily. 100mg  in the morning and 200mg  at bedtime) 90 capsule 3  . insulin aspart (NOVOLOG) 100 unit/mL injection Inject 20 Units into the skin 2 (two) times a day.     . insulin glargine (LANTUS) 100 UNIT/ML injection Inject 28 Units into the skin at bedtime.     . liraglutide (VICTOZA) 18 MG/3ML SOPN Inject 1.2 mg into the skin at bedtime.    [name] lisinopril (PRINIVIL,ZESTRIL) 40 MG tablet Take 40 mg by mouth daily.    . Melatonin 10 MG TABS Take 10 mg by mouth at bedtime.     . Omega-3 Fatty Acids (FISH OIL) 1200 MG CAPS Take 1,200 mg by mouth daily.       Allergies:  Allergies as of 06/29/2018  . (No Known Allergies)    ROS:  General: Denies weight loss, weight gain, fatigue, fevers, chills, and night sweats. Eyes: Denies blurry vision, double vision, eye pain, itchy eyes, and tearing. Ears: Denies hearing loss, earache, and ringing in ears. Nose: Denies sinus pain, congestion, infections, runny nose, and nosebleeds. Mouth/throat: Denies hoarseness, sore throat, bleeding gums, and difficulty swallowing. Heart: Denies chest pain, palpitations, racing heart, irregular heartbeat, leg pain or swelling, and decreased activity tolerance. Respiratory: Denies breathing difficulty, shortness of breath, wheezing, cough, and sputum. GI: Denies change in appetite, heartburn,  vomiting, constipation, diarrhea, and  blood in stool. GU: Denies difficulty urinating, pain with urinating, urgency, frequency, blood in urine. Musculoskeletal: Denies joint stiffness, pain, swelling, muscle weakness. Skin: Denies rash, itching, mass, tumors, sores, and boils Neurologic: Denies headache, fainting, dizziness, seizures,  numbness, and tingling. Psychiatric: Denies depression, anxiety, difficulty sleeping, and memory loss. Endocrine: Denies heat or cold intolerance, and increased thirst or urination. Blood/lymph: Denies easy bruising, easy bruising, and swollen glands     Objective:     BP (!) 176/83   Pulse 72   Temp 98.5 F (36.9 C) (Oral)   Resp 20   Ht 5' 7.99" (1.727 m)   Wt 96.6 kg   SpO2 94%   BMI 32.39 kg/m    Constitutional :  alert, cooperative, appears stated age and no distress  Lymphatics/Throat:  no asymmetry, masses, or scars  Respiratory:  clear to auscultation bilaterally  Cardiovascular:  regular rate and rhythm  Gastrointestinal: soft, no guarding, some tenderness to palpation, epigastric region.   Musculoskeletal: Steady gait and movement  Skin: Cool and moist  Psychiatric: Normal affect, non-agitated, not confused       LABS:  CMP Latest Ref Rng & Units 06/30/2018 06/29/2018 12/15/2017  Glucose 70 - 99 mg/dL 440(H321(H) 474(Q181(H) 595(G243(H)  BUN 8 - 23 mg/dL 38(V39(H) 56(E36(H) 18  Creatinine 0.61 - 1.24 mg/dL 3.32(R1.50(H) 5.18(A1.53(H) 4.161.09  Sodium 135 - 145 mmol/L 143 138 143  Potassium 3.5 - 5.1 mmol/L 3.8 3.6 3.8  Chloride 98 - 111 mmol/L 107 105 104  CO2 22 - 32 mmol/L 25 26 31   Calcium 8.9 - 10.3 mg/dL 9.1 9.6 9.7  Total Protein 6.5 - 8.1 g/dL 6.7 7.1 -  Total Bilirubin 0.3 - 1.2 mg/dL 2.4(H) 0.9 -  Alkaline Phos 38 - 126 U/L 119 85 -  AST 15 - 41 U/L 611(H) 182(H) -  ALT 0 - 44 U/L 444(H) 102(H) -   CBC Latest Ref Rng & Units 06/30/2018 06/29/2018 12/15/2017  WBC 4.0 - 10.5 K/uL 7.4 5.2 4.6  Hemoglobin 13.0 - 17.0 g/dL 12.6(L) 12.7(L) 12.6(L)  Hematocrit 39.0 - 52.0 % 37.8(L) 37.6(L) 38.4(L)  Platelets 150 - 400 K/uL 175 211 185     RADS: CLINICAL DATA:  73 year old male with abdominal pain, pancreatitis.  EXAM: ULTRASOUND ABDOMEN LIMITED RIGHT UPPER QUADRANT  COMPARISON:  CT Abdomen and Pelvis 06/29/2018.  FINDINGS: Gallbladder:  Positive for small echogenic gallstones,  individually estimated at 7 millimeters. Borderline to mild gallbladder wall thickening at 3 millimeters. No pericholecystic fluid. No sonographic Murphy sign elicited.  Common bile duct:  Diameter: 3-6 millimeters, within normal limits.  Liver:  No focal lesion identified. Within normal limits in parenchymal echogenicity. Portal vein is patent on color Doppler imaging with normal direction of blood flow towards the liver.  Other findings: Negative visible right kidney.  IMPRESSION: 1. Cholelithiasis but no strong evidence of acute cholecystitis. 2. CBD within normal limits.   Electronically Signed   By: Odessa FlemingH  Hall M.D.   On: 06/30/2018 01:27   CLINICAL DATA:  Epigastric abdominal pain  EXAM: CT ABDOMEN AND PELVIS WITH CONTRAST  TECHNIQUE: Multidetector CT imaging of the abdomen and pelvis was performed using the standard protocol following bolus administration of intravenous contrast.  CONTRAST:  75mL OMNIPAQUE IOHEXOL 300 MG/ML  SOLN  COMPARISON:  CT dated 07/23/2016  FINDINGS: Lower chest: No acute abnormality.  Hepatobiliary: There is likely underlying hepatic steatosis. Multiple gallstones are noted. There may be a gallstone at the distal CBD. There is mild intrahepatic and extrahepatic  biliary ductal dilatation.  Pancreas: There is diffuse peripancreatic fat stranding. The pancreas enhances uniformly. There is no well-formed drainable fluid collection at this time.  Spleen: The spleen is borderline enlarged.  Adrenals/Urinary Tract: Adrenal glands are unremarkable. Kidneys are normal, without renal calculi, focal lesion, or hydronephrosis. Bladder is unremarkable.  Stomach/Bowel: There is a moderate-sized hiatal hernia. There is scattered colonic diverticula without CT evidence of diverticulitis. There is a normal appendix in the right lower quadrant. There is no evidence of a high-grade small bowel  obstruction.  Vascular/Lymphatic: Aortic atherosclerosis. No enlarged abdominal or pelvic lymph nodes. There is a patent right common iliac artery stent. The splenic artery and splenic vein are patent.  Reproductive: The prostate gland is mildly enlarged.  Other: There is stable subcutaneous densities involving the low anterior abdominal wall. These may represent injection granulomas. There is a fat containing left inguinal hernia.  Musculoskeletal: No acute or significant osseous findings.  IMPRESSION: 1. Findings consistent with acute uncomplicated pancreatitis. 2. Findings suspicious for choledocholithiasis with a small stone at the distal CBD. 3. Cholelithiasis. There is mild intrahepatic and extrahepatic biliary ductal dilatation. There is no CT evidence of acute cholecystitis. 4. Borderline splenomegaly. 5.  Aortic Atherosclerosis (ICD10-I70.0).   Electronically Signed   By: Katherine Mantlehristopher  Green M.D.   On: 06/29/2018 22:42 Assessment:      Gallstone pancreatitis Hx of CAD OSA with CPAP Obesity Incidental left inguinal hernia  Plan:     Plan for ERCP by GI service, will likely proceed with lap chole afterwards and once pancreatitis symptoms improve.  Will discuss details of r/b/a of lap chole closer to time.  He verbalized understanding.  Pt will be higher risk surgical candidate due to above co-morbidities, will still proceed at this time due to benefits outweighing risks.  Continue home meds minus aspirin if possiblle, and further management per hospitalist team.

## 2018-06-30 NOTE — H&P (Signed)
[facility] [place] at [facility]   PATIENT NAME: Mark KaiserRoger Bean    MR#:  [id number]  DATE OF BIRTH:  11/05/1945  DATE OF ADMISSION:  06/29/2018  PR[facility] PHYSIC[facility]   REQUESTING/REFERRING PHYSICIAN: Manson PasseyBrown, MD  CHIEF COMPLAINT:   Chief Complaint  Patient presents with  . Chest Pain    HISTORY OF PRESENT ILLNESS:  Mark KaiserRoger Phillips  is a 73 y.o. male who presents with chief complaint as above.  Presents the ED with acute onset of severe abdominal pain rating to his back.  This started earlier today.  He has significant associated nausea with vomiting.  On evaluation here in the ED is found to have a lipase of 4000, with choledocholithiasis seen on CT imaging.  GI was contacted by ED physician and hospitalist were called for admission  PAST MEDICAL HISTORY:   Past Medical History:  Diagnosis Date  . Anxiety   . Arthritis   . Coronary artery disease   . Diabetes mellitus without complication (HCC)   . Exposure to [employer] Orange   . Hypertension    dr Mark Bean    [phone]  . Kidney stones   . Myocardial infarction (HCC)    silent mi  . Pancreatitis, acute    caused by medicaion  . Sleep apnea    c pap     PAST SURGICAL HISTORY:   Past Surgical History:  Procedure Laterality Date  . AMPUTATION Right 10/11/2012   Procedure: 2nd ray amputation, medial column fusion and ostectomy right foot;  Surgeon: Nadara MustardMarcus V Duda, MD;  Location: [facility] OR;  Service: Orthopedics;  Laterality: Right;  . AMPUTATION Right 06/15/2013   Procedure: AMPUTATION DIGIT;  Surgeon: Nadara MustardMarcus Duda V, MD;  Location: [facility] OR;  Service: Orthopedics;  Laterality: Right;  Right 3rd Toe Amputation  . AMPUTATION Right 12/03/2015   Procedure: AMPUTATION BELOW RIGHT KNEE;  Surgeon: Nadara MustardMarcus V Duda, MD;  Location: [facility] OR;  Service: Orthopedics;  Laterality: Right;  . ANKLE FUSION Right 10/11/2012   Procedure: ARTHRODESIS ANKLE;  Surgeon: Nadara MustardMarcus V Duda, MD;  [facility];   Service: Orthopedics;  Laterality: Right;  . APPLICATION OF WOUND VAC  12/03/2015  . CARDIAC CATHETERIZATION     2000  . CATARACT EXTRACTION W/PHACO Right 04/06/2017   Procedure: CATARACT EXTRACTION PHACO AND INTRAOCULAR LENS PLACEMENT (IOC);  Surgeon: Nevada CraneKing, Bradley Mark, MD;  Location: [facility] ORS;  Service: Ophthalmology;  Laterality: Right;  US 01:01.5 AP% 13.1 CDE 8.07 Fluid pack lot # [id number]  . COLONOSCOPY    . CORONARY ANGIOPLASTY     stent  . CORONARY ARTERY BYPASS GRAFT     2000   1 stent  . EYE SURGERY Right    cataract   . HARDWARE REMOVAL Right 01/05/2013   Procedure: HARDWARE REMOVAL;  Surgeon: Nadara MustardMarcus V Duda, MD;  [facility];  Service: Orthopedics;  Laterality: Right;  Right Foot Debridement, Placement antibiotic beads, remove screw  . I&D EXTREMITY Right 11/10/2012   Procedure: IRRIGATION AND DEBRIDEMENT EXTREMITY;  Surgeon: Nadara MustardMarcus V Duda, MD;  Location: [facility] OR;  Service: Orthopedics;  Laterality: Right;  Irrigation and Debridement Right Foot Wound,  Antibiotic Beads and Wound VAC  . JOINT REPLACEMENT Left    knee  . REPLACEMENT TOTAL KNEE Right 02/15/2011  . stents    . TONSILLECTOMY    . VASECTOMY       SOCIAL HISTORY:   Social History   Tobacco Use  . Smoking status:  Never Smoker  . Smokeless tobacco: Never Used  Substance Use Topics  . Alcohol use: Yes    Alcohol/week: 12.0 standard drinks    Types: 12 Cans of beer per week     FAMILY HISTORY:    Family history reviewed and is non-contributory DRUG ALLERGIES:  No Known Allergies  MEDICATIONS AT HOME:   Prior to Admission medications   Medication Sig Start Date End Date Taking? Authorizing Provider  amLODipine (NORVASC) 10 MG tablet Take 5 mg by mouth daily.  11/25/17 11/25/18 Yes [provider]  aspirin EC 81 MG tablet Take 81 mg by mouth at bedtime.    Yes [provider]  atorvastatin (LIPITOR) 80 MG tablet Take 80 mg by mouth daily.   Yes [provider]   carvedilol (COREG) 12.5 MG tablet Take 12.5 mg by mouth 2 (two) times daily with a meal.   Yes [provider]  chlorthalidone (HYGROTON) 50 MG tablet Take 50 mg by mouth daily.    Yes [provider]  cholecalciferol (VITAMIN D) 1000 units tablet Take 2,000 Units by mouth daily.   Yes [provider]  citalopram (CELEXA) 40 MG tablet Take 20 mg by mouth daily.   Yes [provider]  cyanocobalamin 1000 MCG tablet Take 1,000 mcg by mouth daily.    Yes [provider]  gabapentin (NEURONTIN) 100 MG capsule TAKE 1 CAPSULE BY MOUTH THREE TIMES A DAY Patient taking differently: Take 100-200 mg by mouth 2 (two) times daily. 100mg  in the morning and 200mg  at bedtime 01/03/17  Yes Newt Minion, MD  insulin aspart (NOVOLOG) 100 unit/mL injection Inject 20 Units into the skin 2 (two) times a day.    Yes [provider]  insulin glargine (LANTUS) 100 UNIT/ML injection Inject 28 Units into the skin at bedtime.    Yes [provider]  liraglutide (VICTOZA) 18 MG/3ML SOPN Inject 1.2 mg into the skin at bedtime.   Yes [provider]  lisinopril (PRINIVIL,ZESTRIL) 40 MG tablet Take 40 mg by mouth daily.   Yes [provider]  Melatonin 10 MG TABS Take 10 mg by mouth at bedtime.    Yes [provider]  Omega-3 Fatty Acids (FISH OIL) 1200 MG CAPS Take 1,200 mg by mouth daily.    Yes [provider]    REVIEW OF SYSTEMS:  Review of Systems  Constitutional: Negative for chills, fever, malaise/fatigue and weight loss.  HENT: Negative for ear pain, hearing loss and tinnitus.   Eyes: Negative for blurred vision, double vision, pain and redness.  Respiratory: Negative for cough, hemoptysis and shortness of breath.   Cardiovascular: Negative for chest pain, palpitations, orthopnea and leg swelling.  Gastrointestinal: Positive for abdominal pain, nausea and vomiting. Negative for constipation and diarrhea.   Genitourinary: Negative for dysuria, frequency and hematuria.  Musculoskeletal: Negative for back pain, joint pain and neck pain.  Skin:       No acne, rash, or lesions  Neurological: Negative for dizziness, tremors, focal weakness and weakness.  Endo/Heme/Allergies: Negative for polydipsia. Does not bruise/bleed easily.  Psychiatric/Behavioral: Negative for depression. The patient is not nervous/anxious and does not have insomnia.      VITAL SIGNS:   Vitals:   06/29/18 1946 06/29/18 2149 06/29/18 2252 06/30/18 0030  BP: (!) 132/58 130/64 (!) 173/79 (!) 176/69  Pulse: 72 73 78 77  Resp: 18 10 (!) 23 (!) 21  Temp: 98 F (36.7 C)     TempSrc: Oral  SpO2: 98% 96% 99% 100%   Wt Readings from Last 3 Encounters:  12/15/17 102.1 kg  04/06/17 95.3 kg  11/19/16 99.8 kg    PHYSICAL EXAMINATION:  Physical Exam  Vitals reviewed. Constitutional: He is oriented to person, place, and time. He appears well-developed and well-nourished. No distress.  HENT:  Head: Normocephalic and atraumatic.  Mouth/Throat: Oropharynx is clear and moist.  Eyes: Pupils are equal, round, and reactive to light. Conjunctivae and EOM are normal. No scleral icterus.  Neck: Normal range of motion. Neck supple. No JVD present. No thyromegaly present.  Cardiovascular: Normal rate, regular rhythm and intact distal pulses. Exam reveals no gallop and no friction rub.  No murmur heard. Respiratory: Effort normal and breath sounds normal. No respiratory distress. He has no wheezes. He has no rales.  GI: Soft. Bowel sounds are normal. He exhibits no distension. There is no abdominal tenderness.  Musculoskeletal: Normal range of motion.        General: No edema.     Comments: No arthritis, no gout  Lymphadenopathy:    He has no cervical adenopathy.  Neurological: He is alert and oriented to person, place, and time. No cranial nerve deficit.  No dysarthria, no aphasia  Skin: Skin is warm and dry. No rash noted. No  erythema.  Psychiatric: He has a normal mood and affect. His behavior is normal. Judgment and thought content normal.    LABORATORY PANEL:   CBC Recent Labs  Lab 06/29/18 1952  WBC 5.2  HGB 12.7*  HCT 37.6*  PLT 211   ------------------------------------------------------------------------------------------------------------------  Chemistries  Recent Labs  Lab 06/29/18 1952 06/29/18 2102  NA 138  --   K 3.6  --   CL 105  --   CO2 26  --   GLUCOSE 181*  --   BUN 36*  --   CREATININE 1.53*  --   CALCIUM 9.6  --   AST  --  182*  ALT  --  102*  ALKPHOS  --  85  BILITOT  --  0.9   ------------------------------------------------------------------------------------------------------------------  Cardiac Enzymes Recent Labs  Lab 06/29/18 2253  TROPONINI <0.03   ------------------------------------------------------------------------------------------------------------------  RADIOLOGY:  Ct Abdomen Pelvis W Contrast  Result Date: 06/29/2018 CLINICAL DATA:  Epigastric abdominal pain EXAM: CT ABDOMEN AND PELVIS WITH CONTRAST TECHNIQUE: Multidetector CT imaging of the abdomen and pelvis was performed using the standard protocol following bolus administration of intravenous contrast. CONTRAST:  75mL OMNIPAQUE IOHEXOL 300 MG/ML  SOLN COMPARISON:  CT dated 07/23/2016 FINDINGS: Lower chest: No acute abnormality. Hepatobiliary: There is likely underlying hepatic steatosis. Multiple gallstones are noted. There may be a gallstone at the distal CBD. There is mild intrahepatic and extrahepatic biliary ductal dilatation. Pancreas: There is diffuse peripancreatic fat stranding. The pancreas enhances uniformly. There is no well-formed drainable fluid collection at this time. Spleen: The spleen is borderline enlarged. Adrenals/Urinary Tract: Adrenal glands are unremarkable. Kidneys are normal, without renal calculi, focal lesion, or hydronephrosis. Bladder is unremarkable. Stomach/Bowel:  There is a moderate-sized hiatal hernia. There is scattered colonic diverticula without CT evidence of diverticulitis. There is a normal appendix in the right lower quadrant. There is no evidence of a high-grade small bowel obstruction. Vascular/Lymphatic: Aortic atherosclerosis. No enlarged abdominal or pelvic lymph nodes. There is a patent right common iliac artery stent. The splenic artery and splenic vein are patent. Reproductive: The prostate gland is mildly enlarged. Other: There is stable subcutaneous densities involving the low anterior abdominal wall. These may represent injection  granulomas. There is a fat containing left inguinal hernia. Musculoskeletal: No acute or significant osseous findings. IMPRESSION: 1. Findings consistent with acute uncomplicated pancreatitis. 2. Findings suspicious for choledocholithiasis with a small stone at the distal CBD. 3. Cholelithiasis. There is mild intrahepatic and extrahepatic biliary ductal dilatation. There is no CT evidence of acute cholecystitis. 4. Borderline splenomegaly. 5.  Aortic Atherosclerosis (ICD10-I70.0). Electronically Signed   By: Katherine Mantlehristopher  Green M.D.   On: 06/29/2018 22:42   Dg Chest Port 1 View  Result Date: 06/29/2018 CLINICAL DATA:  Chest pain EXAM: PORTABLE CHEST 1 VIEW COMPARISON:  12/15/2017 FINDINGS: The heart size is stable. The lung volumes are low. The patient is status post prior median sternotomy. There is no acute osseous abnormality. No large pleural effusion or pneumothorax. There are degenerative changes of both glenohumeral joints. IMPRESSION: No active disease. Electronically Signed   By: Katherine Mantlehristopher  Green M.D.   On: 06/29/2018 20:34    EKG:   Orders placed or performed during the hospital encounter of 06/29/18  . ED EKG  . ED EKG  . EKG 12-Lead  . EKG 12-Lead    IMPRESSION AND PLAN:  Principal Problem:   Pancreatitis -keep patient n.p.o., PRN analgesia and antiemetics.  GI consult for recommendations concerning  CBD stone Active Problems:   Choledocholithiasis -CBD stone seen on CT imaging, GI consult   AKI (acute kidney injury) (HCC) -IV fluids, avoid nephrotoxins and monitor   HTN (hypertension) -home dose antihypertensives   CAD (coronary artery disease) -continue home meds   Diabetes (HCC) -sliding scale insulin   OSA (obstructive sleep apnea) -CPAP nightly  Chart review performed and case discussed with ED provider. Labs, imaging and/or ECG reviewed by provider and discussed with patient/family. Management plans discussed with the patient and/or family.  COVID-19 status: Test pending  DVT PROPHYLAXIS: SubQ lovenox   GI PROPHYLAXIS:  None  ADMISSION STATUS: Inpatient     CODE STATUS: Full Code Status History    Date Active Date Inactive Code Status Order ID Comments User Context   12/03/2015 1316 12/06/2015 1527 Full Code [id number]  Nadara Mustarduda, Marcus V, MD Inpatient   11/10/2012 1610 11/14/2012 1505 Full Code [id number]  Nadara Mustarduda, Marcus V, MD Inpatient   10/11/2012 1215 10/13/2012 1752 Full Code [id number]  Nadara Mustarduda, Marcus V, MD Inpatient   Advance Care Planning Activity      TOTAL TIME TAKING CARE OF THIS PATIENT: 45 minutes.   This patient was evaluated in the context of the global COVID-19 pandemic, which necessitated consideration that the patient might be at risk for infection with the SARS-CoV-2 virus that causes COVID-19. Institutional protocols and algorithms that pertain to the evaluation of patients at risk for COVID-19 are in a state of rapid change based on information released by regulatory bodies including the CDC and federal and state organizations. These policies and algorithms were followed to the best of this provider's knowledge to date during the patient's care at this facility.  Barney DrainDavid F Laquinda Moller 06/30/2018, 12:41 AM  [facility]  Office  [phone]  CC: Primary care physician; [facility]  Note:  This document was prepared using [employer]agon voice  recognition software and may include unintentional dictation errors.

## 2018-06-30 NOTE — ED Notes (Signed)
PT sent to floor with prosthetic leg with shoe and sock, other sock, pants with no pocket contents removed, shirt, and attachments for leg

## 2018-07-01 DIAGNOSIS — K8050 Calculus of bile duct without cholangitis or cholecystitis without obstruction: Secondary | ICD-10-CM

## 2018-07-01 LAB — GLUCOSE, CAPILLARY
Glucose-Capillary: 233 mg/dL — ABNORMAL HIGH (ref 70–99)
Glucose-Capillary: 225 mg/dL — ABNORMAL HIGH (ref 70–99)
Glucose-Capillary: 250 mg/dL — ABNORMAL HIGH (ref 70–99)
Glucose-Capillary: 257 mg/dL — ABNORMAL HIGH (ref 70–99)
Glucose-Capillary: 209 mg/dL — ABNORMAL HIGH (ref 70–99)
Glucose-Capillary: 225 mg/dL — ABNORMAL HIGH (ref 70–99)

## 2018-07-01 LAB — NOVEL CORONAVIRUS, NAA (HOSP ORDER, SEND-OUT TO REF LAB; TAT 18-24 HRS): SARS-CoV-2, NAA: NOT DETECTED

## 2018-07-01 LAB — LIPASE, BLOOD: Lipase: 606 U/L — ABNORMAL HIGH (ref 11–51)

## 2018-07-01 MED ORDER — INSULIN GLARGINE 100 UNIT/ML ~~LOC~~ SOLN
18.0000 [IU] | Freq: Every day | SUBCUTANEOUS | Status: DC
  Administered 2018-07-01 – 2018-07-02 (×2): 18 [IU] via SUBCUTANEOUS
  Filled 2018-07-01 (×3): qty 0.18

## 2018-07-01 NOTE — Anesthesia Preprocedure Evaluation (Deleted)
Anesthesia Evaluation    Airway        Dental   Pulmonary           Cardiovascular hypertension,      Neuro/Psych    GI/Hepatic   Endo/Other  diabetes  Renal/GU      Musculoskeletal   Abdominal   Peds  Hematology   Anesthesia Other Findings Past Medical History: No date: Anxiety No date: Arthritis No date: Coronary artery disease No date: Diabetes mellitus without complication (HCC) No date: Exposure to Agent Orange No date: Hypertension     Comment:  dr Farley Ly    [id number] No date: Kidney stones No date: Myocardial infarction [facility])     Comment:  silent mi No date: Pancreatitis, acute     Comment:  caused by medicaion No date: Sleep apnea     Comment:  c pap   Reproductive/Obstetrics                             Anesthesia Physical Anesthesia Plan Anesthesia Quick Evaluation

## 2018-07-01 NOTE — Progress Notes (Signed)
Subjective:  CC: Mark Bean is a 73 y.o. male  Hospital stay day 1,   gallstone pancreatitis  HPI: Feeling better today.  No nausea/emesis.  Asking for ice chips  ROS:  General: Denies weight loss, weight gain, fatigue, fevers, chills, and night sweats. Heart: Denies chest pain, palpitations, racing heart, irregular heartbeat, leg pain or swelling, and decreased activity tolerance. Respiratory: Denies breathing difficulty, shortness of breath, wheezing, cough, and sputum. GI: Denies change in appetite, heartburn, nausea, vomiting, constipation, diarrhea, and blood in stool. GU: Denies difficulty urinating, pain with urinating, urgency, frequency, blood in urine.   Objective:   Temp:  [97.5 F (36.4 C)-98.3 F (36.8 C)] 97.5 F (36.4 C) (06/13 0723) Pulse Rate:  [71-72] 72 (06/13 0723) Resp:  [17-19] 17 (06/13 0723) BP: (138-172)/(64-76) 143/64 (06/13 0723) SpO2:  [94 %-97 %] 94 % (06/13 0723)     Height: 5' 7.99" (172.7 cm) Weight: 96.6 kg BMI (Calculated): 32.39   Intake/Output this shift:   Intake/Output Summary (Last 24 hours) at 07/01/2018 1051 Last data filed at 07/01/2018 0900 Gross per 24 hour  Intake 1440.8 ml  Output 1700 ml  Net -259.2 ml    Constitutional :  alert, cooperative, appears stated age and no distress  Respiratory:  clear to auscultation bilaterally  Cardiovascular:  regular rate and rhythm  Gastrointestinal: soft, non-tender; bowel sounds normal; no masses,  no organomegaly.   Skin: Cool and moist.   Psychiatric: Normal affect, non-agitated, not confused       LABS:  CMP Latest Ref Rng & Units 06/30/2018 06/29/2018 12/15/2017  Glucose 70 - 99 mg/dL 321(H) 181(H) 243(H)  BUN 8 - 23 mg/dL 39(H) 36(H) 18  Creatinine 0.61 - 1.24 mg/dL 1.50(H) 1.53(H) 1.09  Sodium 135 - 145 mmol/L 143 138 143  Potassium 3.5 - 5.1 mmol/L 3.8 3.6 3.8  Chloride 98 - 111 mmol/L 107 105 104  CO2 22 - 32 mmol/L 25 26 31   Calcium 8.9 - 10.3 mg/dL 9.1 9.6 9.7  Total  Protein 6.5 - 8.1 g/dL 6.7 7.1 -  Total Bilirubin 0.3 - 1.2 mg/dL 2.4(H) 0.9 -  Alkaline Phos 38 - 126 U/L 119 85 -  AST 15 - 41 U/L 611(H) 182(H) -  ALT 0 - 44 U/L 444(H) 102(H) -   CBC Latest Ref Rng & Units 06/30/2018 06/29/2018 12/15/2017  WBC 4.0 - 10.5 K/uL 7.4 5.2 4.6  Hemoglobin 13.0 - 17.0 g/dL 12.6(L) 12.7(L) 12.6(L)  Hematocrit 39.0 - 52.0 % 37.8(L) 37.6(L) 38.4(L)  Platelets 150 - 400 K/uL 175 211 185    RADS: n/a Assessment:   Gallstone pancreatitis.  Improving clinically, pending ERCP.  Aspirin on hold for eventual lap chole.  Will discuss r/b/a to surgery once ERCP is completed, if still deemed appropriately per GI team.

## 2018-07-01 NOTE — Progress Notes (Signed)
Arlyss Repressohini R Finola Rosal, MD [address]  [address]  [place], [place] [address]  Main: [phone]  Fax: [phone] Pager: [phone]   Subjective: Patient is delirious, per nurse patient was complaining of abdominal pain and required pain medication.  He is asking for something to drink.  He did not have nausea or vomiting.  Afebrile, but he reports feeling cold   Objective: Vital signs in last 24 hours: Vitals:   06/30/18 0825 06/30/18 1605 07/01/18 0103 07/01/18 0723  BP: (!) 176/83 (!) 172/76 138/72 (!) 143/64  Pulse: 72 72 71 72  Resp: 20  19 17   Temp: 98.5 F (36.9 C) 98.3 F (36.8 C) 98.3 F (36.8 C) (!) 97.5 F (36.4 C)  TempSrc: Oral Oral Oral   SpO2: 94% 97% 95% 94%  Weight:      Height:       Weight change:   Intake/Output Summary (Last 24 hours) at 07/01/2018 1320 Last data filed at 07/01/2018 1227 Gross per 24 hour  Intake 1440.8 ml  Output 1820 ml  Net -379.2 ml     Exam: Heart:: Regular rate and rhythm or S1S2 present Lungs: normal and clear to auscultation Abdomen: soft, nontender, normal bowel sounds   Lab Results: CBC Latest Ref Rng & Units 06/30/2018 06/29/2018 12/15/2017  WBC 4.0 - 10.5 K/uL 7.4 5.2 4.6  Hemoglobin 13.0 - 17.0 g/dL 12.6(L) 12.7(L) 12.6(L)  Hematocrit 39.0 - 52.0 % 37.8(L) 37.6(L) 38.4(L)  Platelets 150 - 400 K/uL 175 211 185   CMP Latest Ref Rng & Units 06/30/2018 06/29/2018 12/15/2017  Glucose 70 - 99 mg/dL 401(U321(H) 272(Z181(H) 366(Y243(H)  BUN 8 - 23 mg/dL 40(H39(H) 47(Q36(H) 18  Creatinine 0.61 - 1.24 mg/dL 2.59(D1.50(H) 6.38(V1.53(H) 5.641.09  Sodium 135 - 145 mmol/L 143 138 143  Potassium 3.5 - 5.1 mmol/L 3.8 3.6 3.8  Chloride 98 - 111 mmol/L 107 105 104  CO2 22 - 32 mmol/L 25 26 31   Calcium 8.9 - 10.3 mg/dL 9.1 9.6 9.7  Total Protein 6.5 - 8.1 g/dL 6.7 7.1 -  Total Bilirubin 0.3 - 1.2 mg/dL 2.4(H) 0.9 -  Alkaline Phos 38 - 126 U/L 119 85 -  AST 15 - 41 U/L 611(H) 182(H) -  ALT 0 - 44 U/L 444(H) 102(H) -    Micro Results: Recent Results  (from the past 240 hour(s))  Novel Coronavirus,NAA,(SEND-OUT TO REF LAB - TAT 24-48 hrs); Hosp Order     Status: None   Collection Time: 06/30/18  1:00 AM   Specimen: Nasopharyngeal Swab; Respiratory  Result Value Ref Range Status   SARS-CoV-2, NAA NOT DETECTED NOT DETECTED Final    Comment: (NOTE) This test was developed and its performance characteristics determined by [facility]. This test has not been FDA cleared or approved. This test has been authorized by FDA under an Emergency Use Authorization (EUA). This test is only authorized for the duration of time the declaration that circumstances exist justifying the authorization of the emergency use of in vitro diagnostic tests for detection of SARS-CoV-2 virus and/or diagnosis of COVID-19 infection under section 564(b)(1) of the Act, 21 U.S.C. 332RJJ-8(A)(4360bbb-3(b)(1), unless the authorization is terminated or revoked sooner. When diagnostic testing is negative, the possibility of a false negative result should be considered in the context of a patient's recent exposures and the presence of clinical signs and symptoms consistent with COVID-19. An individual without symptoms of COVID-19 and who is not shedding SARS-CoV-2 virus would expect to have a negative (not detected) result  in this assay. Performed  At: [facility] [address] [place], [place] [id number] Nagendra Sanjai MD Ph:[phone]    Coronavirus Source NASOPHARYNGEAL  Final    Comment: Performed at [facility], [address] [place], [place] [address]  SARS Coronavirus 2 (Hospital order, Performed in [place] hospital lab)     Status: None   Collection Time: 06/30/18  2:49 PM   Specimen: Nasopharyngeal Swab  Result Value Ref Range Status   SARS Coronavirus 2 NEGATIVE NEGATIVE Final    Comment: (NOTE) If result is NEGATIVE SARS-CoV-2 target nucleic acids are NOT DETECTED. The SARS-CoV-2 RNA is generally detectable in upper and lower   respiratory specimens during the acute phase of infection. The lowest  concentration of SARS-CoV-2 viral copies this assay can detect is 250  copies / mL. A negative result does not preclude SARS-CoV-2 infection  and should not be used as the sole basis for treatment or other  patient management decisions.  A negative result may occur with  improper specimen collection / handling, submission of specimen other  than nasopharyngeal swab, presence of viral mutation(s) within the  areas targeted by this assay, and inadequate number of viral copies  (<250 copies / mL). A negative result must be combined with clinical  observations, patient history, and epidemiological information. If result is POSITIVE SARS-CoV-2 target nucleic acids are DETECTED. The SARS-CoV-2 RNA is generally detectable in upper and lower  respiratory specimens dur ing the acute phase of infection.  Positive  results are indicative of active infection with SARS-CoV-2.  Clinical  correlation with patient history and other diagnostic information is  necessary to determine patient infection status.  Positive results do  not rule out bacterial infection or co-infection with other viruses. If result is PRESUMPTIVE POSTIVE SARS-CoV-2 nucleic acids MAY BE PRESENT.   A presumptive positive result was obtained on the submitted specimen  and confirmed on repeat testing.  While 2019 novel coronavirus  (SARS-CoV-2) nucleic acids may be present in the submitted sample  additional confirmatory testing may be necessary for epidemiological  and / or clinical management purposes617  [phone] between  SARS-CoV-2 and other Sarbecovirus currently known to infect e abnormality. Hepatobiliary: There is likely underlying hepatic steatosis. Multiple gallstones are noted. There may be a gallstone at the distal CBD. There  is mild intrahepatic and extrahepatic biliary ductal dilatation. Pancreas: There is diffuse peripancreatic fat stranding. The pancreas enhances uniformly. There is no well-formed drainable fluid collection at this time. Spleen: The spleen is borderline enlarged. Adrenals/Urinary Tract: Adrenal glands are unremarkable. Kidneys are normal, without renal calculi, focal lesion, or hydronephrosis. Bladder is unremarkable. Stomach/Bowel: There is a moderate-sized hiatal hernia. There is scattered colonic diverticula without CT evidence of diverticulitis. There is a normal appendix in the right lower quadrant. There is no evidence of a high-grade small bowel obstruction. Vascular/Lymphatic: Aortic  atherosclerosis. No enlarged abdominal or pelvic lymph nodes. There is a patent right common iliac artery stent. The splenic artery and splenic vein are patent. Reproductive: The prostate gland is mildly enlarged. Other: There is stable subcutaneous densities involving the low anterior abdominal wall. These may represent injection granulomas. There is a fat containing left inguinal hernia. Musculoskeletal: No acute or significant osseous findings. IMPRESSION: 1. Findings consistent with acute uncomplicated pancreatitis. 2. Findings suspicious for choledocholithiasis with a small stone at the distal CBD. 3. Cholelithiasis. There is mild intrahepatic and extrahepatic biliary ductal dilatation. There is no CT evidence of acute cholecystitis. 4. Borderline splenomegaly. 5.  Aortic Atherosclerosis (ICD10-I70.0). Electronically Signed   By: Constance Holster M.D.   On: 06/29/2018 22:42   Dg Chest Port 1 View  Result Date: 06/29/2018 CLINICAL DATA:  Chest pain EXAM: PORTABLE CHEST 1 VIEW COMPARISON:  12/15/2017 FINDINGS: The heart size is stable. The lung volumes are low. The patient is status post prior median sternotomy. There is no acute osseous abnormality. No large pleural effusion or pneumothorax. There are degenerative changes of both glenohumeral joints. IMPRESSION: No active disease. Electronically Signed   By: Constance Holster M.D.   On: 06/29/2018 20:34   US Abdomen Limited Ruq  Result Date: 06/30/2018 CLINICAL DATA:  73 year old male with abdominal pain, pancreatitis. EXAM: ULTRASOUND ABDOMEN LIMITED RIGHT UPPER QUADRANT COMPARISON:  CT Abdomen and Pelvis 06/29/2018. FINDINGS: Gallbladder: Positive for small echogenic gallstones, individually estimated at 7 millimeters. Borderline to mild gallbladder wall thickening at 3 millimeters. No pericholecystic fluid. No sonographic Murphy sign elicited. Common bile duct: Diameter: 3-6 millimeters, within normal limits. Liver: No focal lesion identified.  Within normal limits in parenchymal echogenicity. Portal vein is patent on color Doppler imaging with normal direction of blood flow towards the liver. Other findings: Negative visible right kidney. IMPRESSION: 1. Cholelithiasis but no strong evidence of acute cholecystitis. 2. CBD within normal limits. Electronically Signed   By: Genevie Ann M.D.   On: 06/30/2018 01:27   Medications:  I have reviewed the patient's current medications. Prior to Admission:  Medications Prior to Admission  Medication Sig Dispense Refill Last Dose   amLODipine (NORVASC) 10 MG tablet Take 5 mg by mouth daily.    06/29/2018 at Unknown time   aspirin EC 81 MG tablet Take 81 mg by mouth at bedtime.    06/28/2018 at Unknown time   atorvastatin (LIPITOR) 80 MG tablet Take 80 mg by mouth daily.   06/28/2018 at Unknown time   carvedilol (COREG) 12.5 MG tablet Take 12.5 mg by mouth 2 (two) times daily with a meal.   06/29/2018 at am   chlorthalidone (HYGROTON) 50 MG tablet Take 50 mg by mouth daily.    06/29/2018 at Unknown time   cholecalciferol (VITAMIN D) 1000 units tablet Take 2,000 Units by mouth daily.   06/29/2018 at Unknown time   citalopram (CELEXA) 40 MG tablet Take 20 mg by mouth daily.  06/29/2018 at Unknown time   cyanocobalamin 1000 MCG tablet Take 1,000 mcg by mouth daily.    06/29/2018 at Unknown time   gabapentin (NEURONTIN) 100 MG capsule TAKE 1 CAPSULE BY MOUTH THREE TIMES A DAY (Patient taking differently: Take 100-200 mg by mouth 2 (two) times daily. 100mg  in the morning and 200mg  at bedtime) 90 capsule 3 06/29/2018 at am   insulin aspart (NOVOLOG) 100 unit/mL injection Inject 20 Units into the skin 2 (two) times a day.    06/29/2018 at am   insulin glargine (LANTUS) 100 UNIT/ML injection Inject 28 Units into the skin at bedtime.    06/28/2018 at Unknown time   liraglutide (VICTOZA) 18 MG/3ML SOPN Inject 1.2 mg into the skin at bedtime.   06/28/2018 at Unknown time   lisinopril (PRINIVIL,ZESTRIL) 40 MG  tablet Take 40 mg by mouth daily.   06/29/2018 at Unknown time   Melatonin 10 MG TABS Take 10 mg by mouth at bedtime.    06/28/2018 at Unknown time   Omega-3 Fatty Acids (FISH OIL) 1200 MG CAPS Take 1,200 mg by mouth daily.    06/29/2018 at Unknown time   Scheduled:  amLODipine  5 mg Oral Daily   carvedilol  12.5 mg Oral BID WC   citalopram  20 mg Oral Daily   insulin aspart  0-9 Units Subcutaneous Q4H   insulin glargine  14 Units Subcutaneous QHS   iohexol  50 mL Oral Once   lisinopril  40 mg Oral Daily   Continuous:  lactated ringers 100 mL/hr at 07/01/18 [id number]   RUE:[id number]:acetaminophen **OR** acetaminophen, morphine injection, ondansetron **OR** ondansetron (ZOFRAN) IV, oxyCODONE Anti-infectives (From admission, onward)   None     Scheduled Meds:  amLODipine  5 mg Oral Daily   carvedilol  12.5 mg Oral BID WC   citalopram  20 mg Oral Daily   insulin aspart  0-9 Units Subcutaneous Q4H   insulin glargine  14 Units Subcutaneous QHS   iohexol  50 mL Oral Once   lisinopril  40 mg Oral Daily   Continuous Infusions:  lactated ringers 100 mL/hr at 07/01/18 0613   PRN Meds:.acetaminophen **OR** acetaminophen, morphine injection, ondansetron **OR** ondansetron (ZOFRAN) IV, oxyCODONE   Assessment: Principal Problem:   Pancreatitis Active Problems:   Choledocholithiasis   OSA (obstructive sleep apnea)   HTN (hypertension)   CAD (coronary artery disease)   Diabetes (HCC)   AKI (acute kidney injury) (HCC)  Acute gallstone pancreatitis, LFTs worsened today, afebrile, no leukocytosis  Plan: Continue IV fluids, increase Ringer's lactate to 150 mL/h N.p.o. except ice chips Pain control as needed Recommend blood cultures, ordered Monitor LFTs Plan for ERCP by Dr. Servando SnareWohl tomorrow   LOS: 1 day   Sharisse Rantz 07/01/2018, 1:20 PM

## 2018-07-01 NOTE — Progress Notes (Addendum)
[facility] [place] NAME: Mark Bean    MR#:  [id number]  DATE OF BIRTH:  1945-12-17  SUBJECTIVE:   Patient has better abdominal pain.  Has nausea but no vomiting or diarrhea REVIEW OF SYSTEMS:   Review of Systems  Constitutional: Negative for chills, fever and weight loss.  HENT: Negative for ear discharge, ear pain and nosebleeds.   Eyes: Negative for blurred vision, pain and discharge.  Respiratory: Negative for sputum production, shortness of breath, wheezing and stridor.   Cardiovascular: Negative for chest pain, palpitations, orthopnea and PND.  Gastrointestinal: Positive for abdominal pain and nausea. Negative for diarrhea and vomiting.  Genitourinary: Negative for frequency and urgency.  Musculoskeletal: Negative for back pain and joint pain.  Neurological: Negative for sensory change, speech change, focal weakness and weakness.  Psychiatric/Behavioral: Negative for depression and hallucinations. The patient is not nervous/anxious.    Tolerating Diet:no   DRUG ALLERGIES:  No Known Allergies  VITALS:  Blood pressure (!) 143/64, pulse 72, temperature (!) 97.5 F (36.4 C), resp. rate 17, height 5' 7.99" (1.727 m), weight 96.6 kg, SpO2 94 %.  PHYSICAL EXAMINATION:   Physical Exam  GENERAL:  73 y.o.-year-old patient lying in the bed with no acute distress. Obese. EYES: Pupils equal, round, reactive to light and accommodation. No scleral icterus. Extraocular muscles intact.  HEENT: Head atraumatic, normocephalic. Oropharynx and nasopharynx clear.  NECK:  Supple, no jugular venous distention. No thyroid enlargement, no tenderness.  LUNGS: Normal breath sounds bilaterally, no wheezing, rales, rhonchi. No use of accessory muscles of respiration.  CARDIOVASCULAR: S1, S2 normal. No murmurs, rubs, or gallops.  ABDOMEN: Soft, tenderness in middle part, nondistended. Bowel sounds present. No organomegaly or mass.  EXTREMITIES:  No cyanosis, clubbing or edema b/l.   BKA stump stable NEUROLOGIC: Cranial nerves II through XII are intact. No focal Motor or sensory deficits b/l.   PSYCHIATRIC:  patient is alert and oriented x 3.  SKIN: No obvious rash, lesion, or ulcer.   LABORATORY PANEL:  CBC Recent Labs  Lab 06/30/18 0512  WBC 7.4  HGB 12.6*  HCT 37.8*  PLT 175    Chemistries  Recent Labs  Lab 06/30/18 0512  NA 143  K 3.8  CL 107  CO2 25  GLUCOSE 321*  BUN 39*  CREATININE 1.50*  CALCIUM 9.1  AST 611*  ALT 444*  ALKPHOS 119  BILITOT 2.4*   Cardiac Enzymes Recent Labs  Lab 06/29/18 2253  TROPONINI <0.03   RADIOLOGY:  Ct Abdomen Pelvis W Contrast  Result Date: 06/29/2018 CLINICAL DATA:  Epigastric abdominal pain EXAM: CT ABDOMEN AND PELVIS WITH CONTRAST TECHNIQUE: Multidetector CT imaging of the abdomen and pelvis was performed using the standard protocol following bolus administration of intravenous contrast. CONTRAST:  51mL OMNIPAQUE IOHEXOL 300 MG/ML  SOLN COMPARISON:  CT dated 07/23/2016 FINDINGS: Lower chest: No acute abnormality. Hepatobiliary: There is likely underlying hepatic steatosis. Multiple gallstones are noted. There may be a gallstone at the distal CBD. There is mild intrahepatic and extrahepatic biliary ductal dilatation. Pancreas: There is diffuse peripancreatic fat stranding. The pancreas enhances uniformly. There is no well-formed drainable fluid collection at this time. Spleen: The spleen is borderline enlarged. Adrenals/Urinary Tract: Adrenal glands are unremarkable. Kidneys are normal, without renal calculi, focal lesion, or hydronephrosis. Bladder is unremarkable. Stomach/Bowel: There is a moderate-sized hiatal hernia. There is scattered colonic diverticula without CT evidence of diverticulitis. There is a normal appendix in the right lower  quadrant. There is no evidence of a high-grade small bowel obstruction. Vascular/Lymphatic: Aortic atherosclerosis. No enlarged abdominal or  pelvic lymph nodes. There is a patent right common iliac artery stent. The splenic artery and splenic vein are patent. Reproductive: The prostate gland is mildly enlarged. Other: There is stable subcutaneous densities involving the low anterior abdominal wall. These may represent injection granulomas. There is a fat containing left inguinal hernia. Musculoskeletal: No acute or significant osseous findings. IMPRESSION: 1. Findings consistent with acute uncomplicated pancreatitis. 2. Findings suspicious for choledocholithiasis with a small stone at the distal CBD. 3. Cholelithiasis. There is mild intrahepatic and extrahepatic biliary ductal dilatation. There is no CT evidence of acute cholecystitis. 4. Borderline splenomegaly. 5.  Aortic Atherosclerosis (ICD10-I70.0). Electronically Signed   By: Katherine Mantlehristopher  Green M.D.   On: 06/29/2018 22:42   Dg Chest Port 1 View  Result Date: 06/29/2018 CLINICAL DATA:  Chest pain EXAM: PORTABLE CHEST 1 VIEW COMPARISON:  12/15/2017 FINDINGS: The heart size is stable. The lung volumes are low. The patient is status post prior median sternotomy. There is no acute osseous abnormality. No large pleural effusion or pneumothorax. There are degenerative changes of both glenohumeral joints. IMPRESSION: No active disease. Electronically Signed   By: Katherine Mantlehristopher  Green M.D.   On: 06/29/2018 20:34   [place] Abdomen Limited Ruq  Result Date: 06/30/2018 CLINICAL DATA:  73 year old male with abdominal pain, pancreatitis. EXAM: ULTRASOUND ABDOMEN LIMITED RIGHT UPPER QUADRANT COMPARISON:  CT Abdomen and Pelvis 06/29/2018. FINDINGS: Gallbladder: Positive for small echogenic gallstones, individually estimated at 7 millimeters. Borderline to mild gallbladder wall thickening at 3 millimeters. No pericholecystic fluid. No sonographic Murphy sign elicited. Common bile duct: Diameter: 3-6 millimeters, within normal limits. Liver: No focal lesion identified. Within normal limits in parenchymal  echogenicity. Portal vein is patent on color Doppler imaging with normal direction of blood flow towards the liver. Other findings: Negative visible right kidney. IMPRESSION: 1. Cholelithiasis but no strong evidence of acute cholecystitis. 2. CBD within normal limits. Electronically Signed   By: Odessa FlemingH  Hall M.D.   On: 06/30/2018 01:27   ASSESSMENT AND PLAN:   Mark Bean  is a 73 y.o. male who presents with anxiety, arthritis, CAD, diabetes  Presents the ED with acute onset of severe abdominal pain rating to his back.  This started earlier today.  He has significant associated nausea with vomiting.  On evaluation here in the ED is found to have a lipase of 4000, with Choledocholithiasis seen on CT imaging  1.  Acute pancreatitis suspected gallstone induced with  choledocholithiasis Continue IV fluids, PO with ice chips and meds -lipase 4000--- 1200-606. -increasing LFTs. Hold statins -IV morphine PRN Plan for ERCP by Dr. Servando SnareWohl tomorrow per Dr. Allegra LaiVanga.  2. Acute cholelithiasis  Aspirin on hold for eventual lap chole per Dr. Tonna BoehringerSakai.  3. Type II diabetes, hyperglycemia, increase Lantus to 18 unit HS, continue sliding scale insulin  4. Hypertension continue home meds  5. CAD continue home meds  6. DVT prophylaxis subcu  SCD  I discussed with Dr. Tonna BoehringerSakai.  CODE STATUS: full  DVT Prophylaxis: scd-- pt pending ERCP and GB surgery.  TOTAL TIME TAKING CARE OF THIS PATIENT: 35 minutes.  >50% time spent on counselling and coordination of care I discussed with the patient and his wife. POSSIBLE D/C IN 2-3 DAYS, DEPENDING ON CLINICAL CONDITION.  Note: This dictation was prepared with Dragon dictation along with smaller phrase technology. Any transcriptional errors that result from this process are unintentional.  Alfredo BattyQing  Jadie Comas M.D on 07/01/2018 at 1:40 PM  Between 7am to 6pm - Pager - [phone]  After 6pm go to [email or web address] - Social research officer, [email or web address] [email or web address]  [facility]  Office   [phone]  CC: Primary care physician; [facility]   DOB: 01/22/1945, 73 y.o.   MRN: [id number]

## 2018-07-02 ENCOUNTER — Encounter: Payer: Self-pay | Admitting: Anesthesiology

## 2018-07-02 ENCOUNTER — Inpatient Hospital Stay: Payer: Medicare Other

## 2018-07-02 ENCOUNTER — Inpatient Hospital Stay: Payer: Medicare Other | Admitting: Anesthesiology

## 2018-07-02 ENCOUNTER — Encounter
Admission: EM | Disposition: A | Discharge status: Home / Self Care | Payer: Self-pay | Source: Home / Self Care | Attending: Internal Medicine

## 2018-07-02 DIAGNOSIS — K851 Biliary acute pancreatitis without necrosis or infection: Principal | ICD-10-CM

## 2018-07-02 DIAGNOSIS — K831 Obstruction of bile duct: Secondary | ICD-10-CM

## 2018-07-02 DIAGNOSIS — R748 Abnormal levels of other serum enzymes: Secondary | ICD-10-CM

## 2018-07-02 HISTORY — PX: ENDOSCOPIC RETROGRADE CHOLANGIOPANCREATOGRAPHY (ERCP) WITH PROPOFOL: SHX5810

## 2018-07-02 LAB — COMPREHENSIVE METABOLIC PANEL
ALT: 130 U/L — ABNORMAL HIGH (ref 0–44)
AST: 54 U/L — ABNORMAL HIGH (ref 15–41)
Albumin: 3.3 g/dL — ABNORMAL LOW (ref 3.5–5.0)
Alkaline Phosphatase: 71 U/L (ref 38–126)
Anion gap: 9 (ref 5–15)
BUN: 35 mg/dL — ABNORMAL HIGH (ref 8–23)
CO2: 29 mmol/L (ref 22–32)
Calcium: 8.1 mg/dL — ABNORMAL LOW (ref 8.9–10.3)
Chloride: 104 mmol/L (ref 98–111)
Creatinine, Ser: 1.21 mg/dL (ref 0.61–1.24)
GFR calc Af Amer: >60 mL/min (ref >60)
GFR calc non Af Amer: 59 mL/min — ABNORMAL LOW (ref >60)
Glucose, Bld: 223 mg/dL — ABNORMAL HIGH (ref 70–99)
Potassium: 3.1 mmol/L — ABNORMAL LOW (ref 3.5–5.1)
Sodium: 142 mmol/L (ref 135–145)
Total Bilirubin: 1.9 mg/dL — ABNORMAL HIGH (ref 0.3–1.2)
Total Protein: 5.9 g/dL — ABNORMAL LOW (ref 6.5–8.1)

## 2018-07-02 LAB — GLUCOSE, CAPILLARY
Glucose-Capillary: 170 mg/dL — ABNORMAL HIGH (ref 70–99)
Glucose-Capillary: 182 mg/dL — ABNORMAL HIGH (ref 70–99)
Glucose-Capillary: 184 mg/dL — ABNORMAL HIGH (ref 70–99)
Glucose-Capillary: 201 mg/dL — ABNORMAL HIGH (ref 70–99)
Glucose-Capillary: 212 mg/dL — ABNORMAL HIGH (ref 70–99)
Glucose-Capillary: 229 mg/dL — ABNORMAL HIGH (ref 70–99)
Glucose-Capillary: 241 mg/dL — ABNORMAL HIGH (ref 70–99)

## 2018-07-02 LAB — LIPASE, BLOOD: Lipase: 111 U/L — ABNORMAL HIGH (ref 11–51)

## 2018-07-02 LAB — MAGNESIUM: Magnesium: 1.7 mg/dL (ref 1.7–2.4)

## 2018-07-02 SURGERY — ENDOSCOPIC RETROGRADE CHOLANGIOPANCREATOGRAPHY (ERCP) WITH PROPOFOL
Anesthesia: General

## 2018-07-02 MED ORDER — LIDOCAINE HCL (CARDIAC) PF 100 MG/5ML IV SOSY
PREFILLED_SYRINGE | INTRAVENOUS | Status: DC | PRN
  Administered 2018-07-02: 100 mg via INTRAVENOUS

## 2018-07-02 MED ORDER — ONDANSETRON HCL 4 MG/2ML IJ SOLN
INTRAMUSCULAR | Status: DC | PRN
  Administered 2018-07-02: 4 mg via INTRAVENOUS

## 2018-07-02 MED ORDER — LACTATED RINGERS IV SOLN: INTRAVENOUS | Status: DC

## 2018-07-02 MED ORDER — FENTANYL CITRATE (PF) 100 MCG/2ML IJ SOLN: 25.0000 ug | INTRAMUSCULAR | Status: DC | PRN

## 2018-07-02 MED ORDER — FENTANYL CITRATE (PF) 100 MCG/2ML IJ SOLN
INTRAMUSCULAR | Status: DC | PRN
  Administered 2018-07-02 (×2): 50 ug via INTRAVENOUS

## 2018-07-02 MED ORDER — INDOMETHACIN 50 MG RE SUPP
RECTAL | Status: AC
  Filled 2018-07-02: qty 2

## 2018-07-02 MED ORDER — GLYCOPYRROLATE 0.2 MG/ML IJ SOLN
INTRAMUSCULAR | Status: AC
  Filled 2018-07-02: qty 1

## 2018-07-02 MED ORDER — DEXAMETHASONE SODIUM PHOSPHATE 10 MG/ML IJ SOLN
INTRAMUSCULAR | Status: DC | PRN
  Administered 2018-07-02: 4 mg via INTRAVENOUS

## 2018-07-02 MED ORDER — MAGNESIUM SULFATE 2 GM/50ML IV SOLN
2.0000 g | Freq: Once | INTRAVENOUS | Status: AC
  Administered 2018-07-02: 2 g via INTRAVENOUS
  Filled 2018-07-02: qty 50

## 2018-07-02 MED ORDER — HYDRALAZINE HCL 20 MG/ML IJ SOLN: 10.0000 mg | Freq: Four times a day (QID) | INTRAMUSCULAR | Status: DC | PRN

## 2018-07-02 MED ORDER — SUGAMMADEX SODIUM 200 MG/2ML IV SOLN
INTRAVENOUS | Status: AC
  Filled 2018-07-02: qty 2

## 2018-07-02 MED ORDER — INDOMETHACIN 50 MG RE SUPP: 100.0000 mg | Freq: Once | RECTAL | Status: DC

## 2018-07-02 MED ORDER — PROPOFOL 10 MG/ML IV BOLUS
INTRAVENOUS | Status: AC
  Filled 2018-07-02: qty 20

## 2018-07-02 MED ORDER — EPHEDRINE SULFATE 50 MG/ML IJ SOLN
INTRAMUSCULAR | Status: DC | PRN
  Administered 2018-07-02: 10 mg via INTRAVENOUS
  Administered 2018-07-02: 5 mg via INTRAVENOUS

## 2018-07-02 MED ORDER — PROPOFOL 10 MG/ML IV BOLUS
INTRAVENOUS | Status: DC | PRN
  Administered 2018-07-02: 150 mg via INTRAVENOUS

## 2018-07-02 MED ORDER — SUCCINYLCHOLINE CHLORIDE 20 MG/ML IJ SOLN
INTRAMUSCULAR | Status: AC
  Filled 2018-07-02: qty 1

## 2018-07-02 MED ORDER — LIDOCAINE HCL (PF) 2 % IJ SOLN
INTRAMUSCULAR | Status: AC
  Filled 2018-07-02: qty 10

## 2018-07-02 MED ORDER — INDOMETHACIN 50 MG RE SUPP
100.0000 mg | Freq: Once | RECTAL | Status: AC
  Administered 2018-07-02: 100 mg via RECTAL

## 2018-07-02 MED ORDER — ONDANSETRON HCL 4 MG/2ML IJ SOLN: 4.0000 mg | Freq: Once | INTRAMUSCULAR | Status: DC | PRN

## 2018-07-02 MED ORDER — POTASSIUM CHLORIDE CRYS ER 20 MEQ PO TBCR: 40.0000 meq | EXTENDED_RELEASE_TABLET | ORAL | Status: DC

## 2018-07-02 MED ORDER — PHENYLEPHRINE HCL (PRESSORS) 10 MG/ML IV SOLN
INTRAVENOUS | Status: DC | PRN
  Administered 2018-07-02: 200 ug via INTRAVENOUS
  Administered 2018-07-02 (×4): 100 ug via INTRAVENOUS

## 2018-07-02 MED ORDER — FENTANYL CITRATE (PF) 100 MCG/2ML IJ SOLN
INTRAMUSCULAR | Status: AC
  Filled 2018-07-02: qty 2

## 2018-07-02 MED ORDER — POTASSIUM CHLORIDE IN NACL 40-0.9 MEQ/L-% IV SOLN
INTRAVENOUS | Status: DC
  Administered 2018-07-02 – 2018-07-04 (×4): 75 mL/h via INTRAVENOUS
  Filled 2018-07-02 (×7): qty 1000

## 2018-07-02 MED ORDER — SUGAMMADEX SODIUM 200 MG/2ML IV SOLN
INTRAVENOUS | Status: DC | PRN
  Administered 2018-07-02: 193.2 mg via INTRAVENOUS

## 2018-07-02 MED ORDER — SUCCINYLCHOLINE CHLORIDE 20 MG/ML IJ SOLN
INTRAMUSCULAR | Status: DC | PRN
  Administered 2018-07-02: 100 mg via INTRAVENOUS

## 2018-07-02 MED ORDER — ROCURONIUM BROMIDE 100 MG/10ML IV SOLN
INTRAVENOUS | Status: DC | PRN
  Administered 2018-07-02: 20 mg via INTRAVENOUS

## 2018-07-02 MED ORDER — DEXAMETHASONE SODIUM PHOSPHATE 4 MG/ML IJ SOLN
INTRAMUSCULAR | Status: AC
  Filled 2018-07-02: qty 1

## 2018-07-02 MED ORDER — ONDANSETRON HCL 4 MG/2ML IJ SOLN
INTRAMUSCULAR | Status: AC
  Filled 2018-07-02: qty 2

## 2018-07-02 MED ORDER — GLYCOPYRROLATE 0.2 MG/ML IJ SOLN
INTRAMUSCULAR | Status: DC | PRN
  Administered 2018-07-02: 0.2 mg via INTRAVENOUS

## 2018-07-02 NOTE — Transfer of Care (Signed)
Immediate Anesthesia Transfer of Care Note  Patient: Mark Bean  Procedure(s) Performed: ENDOSCOPIC RETROGRADE CHOLANGIOPANCREATOGRAPHY (ERCP) WITH PROPOFOL (N/A )  Patient Location: PACU  Anesthesia Type:General  Level of Consciousness: awake and alert   Airway & Oxygen Therapy: Patient Spontanous Breathing and Patient connected to face mask oxygen  Post-op Assessment: Report given to RN and Post -op Vital signs reviewed and stable  Post vital signs: Reviewed and stable  Last Vitals:  Vitals Value Taken Time  BP 141/70 07/02/18 0912  Temp 37.3 C 07/02/18 0912  Pulse 94 07/02/18 0914  Resp 23 07/02/18 0914  SpO2 95 % 07/02/18 0914  Vitals shown include unvalidated device data.  Last Pain:  Vitals:   07/02/18 0912  TempSrc:   PainSc: Asleep      Patients Stated Pain Goal: 2 (09/32/35 5732)  Complications: No apparent anesthesia complications

## 2018-07-02 NOTE — Anesthesia Procedure Notes (Signed)
Procedure Name: Intubation Performed by: Demetrius Charity, CRNA Pre-anesthesia Checklist: Patient identified, Patient being monitored, Timeout performed, Emergency Drugs available and Suction available Patient Re-evaluated:Patient Re-evaluated prior to induction Oxygen Delivery Method: Circle system utilized Preoxygenation: Pre-oxygenation with 100% oxygen Induction Type: IV induction Ventilation: Mask ventilation without difficulty Laryngoscope Size: McGraph and 4 Grade View: Grade I Tube type: Oral Tube size: 7.5 mm Number of attempts: 1 Airway Equipment and Method: Stylet and Video-laryngoscopy Placement Confirmation: ETT inserted through vocal cords under direct vision,  positive ETCO2 and breath sounds checked- equal and bilateral Secured at: 24 cm Tube secured with: Tape Dental Injury: Teeth and Oropharynx as per pre-operative assessment  Difficulty Due To: Difficulty was anticipated Future Recommendations: Recommend- induction with short-acting agent, and alternative techniques readily available

## 2018-07-02 NOTE — Anesthesia Post-op Follow-up Note (Signed)
Anesthesia QCDR form completed.        

## 2018-07-02 NOTE — Anesthesia Postprocedure Evaluation (Signed)
Anesthesia Post Note  Patient: Sayvion Vigen Beahm  Procedure(s) Performed: ENDOSCOPIC RETROGRADE CHOLANGIOPANCREATOGRAPHY (ERCP) WITH PROPOFOL (N/A )  Patient location during evaluation: PACU Anesthesia Type: General Level of consciousness: awake and alert Pain management: pain level controlled Vital Signs Assessment: post-procedure vital signs reviewed and stable Respiratory status: spontaneous breathing, nonlabored ventilation, respiratory function stable and patient connected to nasal cannula oxygen Cardiovascular status: blood pressure returned to baseline and stable Postop Assessment: no apparent nausea or vomiting Anesthetic complications: no     Last Vitals:  Vitals:   07/02/18 0942 07/02/18 0957  BP: 131/61 (!) 144/69  Pulse: 83 81  Resp: 19 20  Temp: 37.2 C   SpO2: 95% 93%    Last Pain:  Vitals:   07/02/18 0957  TempSrc:   PainSc: Asleep                 Martha Clan

## 2018-07-02 NOTE — Anesthesia Preprocedure Evaluation (Addendum)
Anesthesia Evaluation  Patient identified by MRN, date of birth, ID band Patient awake    Reviewed: Allergy & Precautions, H&P , NPO status , Patient's Chart, lab work & pertinent test results, reviewed documented beta blocker date and time   History of Anesthesia Complications Negative for: history of anesthetic complications  Airway Mallampati: III  TM Distance: >3 FB Neck ROM: full    Dental  (+) Partial Upper, Dental Advidsory Given   Pulmonary neg shortness of breath, sleep apnea , neg COPD, neg recent URI,           Cardiovascular Exercise Tolerance: Good hypertension, (-) angina+ CAD, + Past MI, + Cardiac Stents and + CABG  (-) dysrhythmias (-) Valvular Problems/Murmurs     Neuro/Psych PSYCHIATRIC DISORDERS Anxiety negative neurological ROS     GI/Hepatic negative GI ROS, Neg liver ROS,   Endo/Other  diabetes  Renal/GU Renal disease (kidney stones)  negative genitourinary   Musculoskeletal   Abdominal   Peds  Hematology negative hematology ROS (+)   Anesthesia Other Findings Past Medical History: No date: Anxiety No date: Arthritis No date: Coronary artery disease No date: Diabetes mellitus without complication (HCC) No date: Exposure to Agent Orange No date: Hypertension     Comment:  dr Farley Ly    [id number] No date: Kidney stones No date: Myocardial infarction [facility])     Comment:  silent mi No date: Pancreatitis, acute     Comment:  caused by medicaion No date: Sleep apnea     Comment:  c pap   Reproductive/Obstetrics negative OB ROS                             Anesthesia Physical  Anesthesia Plan  ASA: III  Anesthesia Plan: General   Post-op Pain Management:    Induction: Intravenous  PONV Risk Score and Plan: 2 and Ondansetron, Dexamethasone and Treatment may vary due to age or medical condition  Airway Management Planned: Oral ETT  Additional Equipment:    Intra-op Plan:   Post-operative Plan:   Informed Consent: I have reviewed the patients History and Physical, chart, labs and discussed the procedure including the risks, benefits and alternatives for the proposed anesthesia with the patient or authorized representative who has indicated his/her understanding and acceptance.     Dental Advisory Given  Plan Discussed with: Anesthesiologist, CRNA and Surgeon  Anesthesia Plan Comments:        Anesthesia Quick Evaluation

## 2018-07-02 NOTE — Op Note (Addendum)
[facility] Gastroenterology Patient Name: Mark KaiserRoger Titsworth Procedure Date: 07/02/2018 8:01 AM MRN: [id number] Account #: [id number] Date of Birth: 08/12/1945 Admit Type: Inpatient Age: [age] [facility] 4 Gender: Male Note Status: Supervisor Override Procedure:            ERCP Indications:          Elevated liver enzymes, Acute pancreatitis Providers:            Midge Miniumarren Lavarr President MD, MD Medicines:            General Anesthesia Complications:        No immediate complications. Procedure:            Pre-Anesthesia Assessment:                       - Prior to the procedure, a History and Physical was                        performed, and patient medications and allergies were                        reviewed. The patient's tolerance of previous                        anesthesia was also reviewed. The risks and benefits of                        the procedure and the sedation options and risks were                        discussed with the patient. All questions were                        answered, and informed consent was obtained. Prior                        Anticoagulants: The patient has taken no previous                        anticoagulant or antiplatelet agents. ASA Grade                        Assessment: III - A patient with severe systemic                        disease. After reviewing the risks and benefits, the                        patient was deemed in satisfactory condition to undergo                        the procedure.                       After obtaining informed consent, the scope was passed                        under direct vision. Throughout the procedure, the                        patient's blood pressure,  pulse, and oxygen saturations                        were monitored continuously. The Duodenoscope was                        introduced through the mouth, and used to inject                        contrast into and used to inject contrast  into the bile                        duct. The ERCP was accomplished without difficulty. The                        patient tolerated the procedure well. Findings:      The scout film was normal. The esophagus was successfully intubated       under direct vision. The scope was advanced to a normal major papilla in       the descending duodenum without detailed examination of the pharynx,       larynx and associated structures, and upper GI tract. The upper GI tract       was grossly normal. The first wire went into the PD without any       injection of contrast into the PD. The wire was used to direct a second       wire easily into the CBD whereupon the first wire was removed. The bile       duct was deeply cannulated with the short-nosed traction sphincterotome.       Contrast was injected. I personally interpreted the bile duct images.       There was brisk flow of contrast through the ducts. Image quality was       excellent. Contrast extended to the entire biliary tree. The lower third       of the main bile duct contained a single segmental stenosis. A wire was       passed into the biliary tree. An 8 mm biliary sphincterotomy was made       with a traction (standard) sphincterotome using ERBE electrocautery.       There was no post-sphincterotomy bleeding. Cells for cytology were       obtained by brushing in the lower third of the main bile duct. One 5 Fr       by 7 cm plastic stent with a single external flap and a single internal       flap was placed 4 cm into the common bile duct. Bile flowed through the       stent. The stent was in good position. 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                       - A biliary sphincterotomy was performed.                       - Cells for cytology obtained in the lower third of the  main duct.                        - One plastic stent was placed into the common bile                        duct. Recommendation:       - NPO today.                       - Continue present medications.                       - Watch for pancreatitis, bleeding, perforation, and                        cholangitis.                       - Spoke to wife and informed her about the results of                        the procedure.                       - Repeat ERCP in 2 months to remove stent. Procedure Code(s):    --- Professional ---                       [id number], Endoscopic retrograde cholangiopancreatography                        (ERCP); with placement of endoscopic stent into biliary                        or pancreatic duct, including pre- and post-dilation                        and guide wire passage, when performed, including                        sphincterotomy, when performed, each stent                       [id number], Endoscopic catheterization of the biliary ductal                        system, radiological supervision and interpretation Diagnosis Code(s):    --- Professional ---                       R74.8, Abnormal levels of other serum enzymes                       K85.90, Acute pancreatitis without necrosis or                        infection, unspecified                       K83.1, Obstruction of bile duct CPT copyright 2019 American Medical Association. All rights reserved. The codes documented in this report are preliminary and upon coder review may  be revised to meet current compliance requirements. Midge Miniumarren Korene Dula MD, MD 07/02/2018 9:07:50  AM This report has been signed electronically. Number of Addenda: 0 Note Initiated On: 07/02/2018 8:01 AM Estimated Blood Loss: Estimated blood loss: none.      [facility]

## 2018-07-02 NOTE — Progress Notes (Signed)
[place] [facility] [place] NAME: Mark Bean    MR#:  [id number]  DATE OF BIRTH:  [birth year]  SUBJECTIVE:   Patient has no abdominal pain.  S/p ERCP this am. REVIEW OF SYSTEMS:   Review of Systems  Constitutional: Negative for chills, fever and weight loss.  HENT: Negative for ear discharge, ear pain and nosebleeds.   Eyes: Negative for blurred vision, pain and discharge.  Respiratory: Negative for sputum production, shortness of breath, wheezing and stridor.   Cardiovascular: Negative for chest pain, palpitations, orthopnea and PND.  Gastrointestinal: Negative for abdominal pain, diarrhea, nausea and vomiting.  Genitourinary: Negative for frequency and urgency.  Musculoskeletal: Negative for back pain and joint pain.  Neurological: Negative for sensory change, speech change, focal weakness and weakness.  Psychiatric/Behavioral: Negative for depression and hallucinations. The patient is not nervous/anxious.    Tolerating Diet:no   DRUG ALLERGIES:  No Known Allergies  VITALS:  Blood pressure (!) 144/69, pulse 81, temperature 99 F (37.2 C), resp. rate 20, height 5\' 7"  (1.702 m), weight 96.6 kg, SpO2 93 %.  PHYSICAL EXAMINATION:   Physical Exam  GENERAL:  73 y.o.-year-old patient lying in the bed with no acute distress. Obese. EYES: Pupils equal, round, reactive to light and accommodation. No scleral icterus. Extraocular muscles intact.  HEENT: Head atraumatic, normocephalic. Oropharynx and nasopharynx clear.  NECK:  Supple, no jugular venous distention. No thyroid enlargement, no tenderness.  LUNGS: Normal breath sounds bilaterally, no wheezing, rales, rhonchi. No use of accessory muscles of respiration.  CARDIOVASCULAR: S1, S2 normal. No murmurs, rubs, or gallops.  ABDOMEN: Soft, no tenderness, nondistended. Bowel sounds present. No organomegaly or mass.  EXTREMITIES: No cyanosis, clubbing or edema b/l.   BKA stump  stable NEUROLOGIC: Cranial nerves II through XII are intact. No focal Motor or sensory deficits b/l.   PSYCHIATRIC:  patient is alert and oriented x 3.  SKIN: No obvious rash, lesion, or ulcer.   LABORATORY PANEL:  CBC Recent Labs  Lab 06/30/18 0512  WBC 7.4  HGB 12.6*  HCT 37.8*  PLT 175    Chemistries  Recent Labs  Lab 07/02/18 0421  NA 142  K 3.1*  CL 104  CO2 29  GLUCOSE 223*  BUN 35*  CREATININE 1.21  CALCIUM 8.1*  MG 1.7  AST 54*  ALT 130*  ALKPHOS 71  BILITOT 1.9*   Cardiac Enzymes Recent Labs  Lab 06/29/18 2253  TROPONINI <0.03   RADIOLOGY:  Dg C-arm 1-60 Min-no Report  Result Date: 07/02/2018 Fluoroscopy was utilized by the requesting physician.  No radiographic interpretation.   ASSESSMENT AND PLAN:   Benjamine Bean  is a 73 y.o. male who presents with anxiety, arthritis, CAD, diabetes  Presents the ED with acute onset of severe abdominal pain rating to his back.  This started earlier today.  He has significant associated nausea with vomiting.  On evaluation here in the ED is found to have a lipase of 4000, with Choledocholithiasis seen on CT imaging  1.  Acute pancreatitis suspected gallstone induced with  choledocholithiasis Continue IV fluids, PO with ice chips and meds -lipase 4000--- [id number]. -increasing LFTs. Hold statins -IV morphine PRN ERCP by Dr. 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                       -  A biliary sphincterotomy was performed.                       - Cells for cytology obtained in the lower third of the                        main duct.                       - One plastic stent was placed into the common bile                        duct. Keep n.p.o. today and repeat ERCP in 2 months to remove stent per Dr. Servando SnareWohl. Liver function tests is improving.  2. Acute cholelithiasis  Aspirin on hold for eventual lap chole per Dr. Tonna BoehringerSakai.  ARF  due to dehydration.  Improving with IV fluid support.  Hypokalemia.  Potassium supplement his IV fluid. Hypomagnesemia.  IV magnesium.  3. Type II diabetes, hyperglycemia, increased Lantus to 18 unit HS, continue sliding scale insulin  4. Hypertension continue home meds  5. CAD continue home meds  6. DVT prophylaxis subcu  SCD.  CODE STATUS: full  DVT Prophylaxis: scd-- pt pending ERCP and GB surgery.  TOTAL TIME TAKING CARE OF THIS PATIENT: 28 minutes.  >50% time spent on counselling and coordination of care I discussed with the patient and his wife. POSSIBLE D/C IN 2 DAYS, DEPENDING ON CLINICAL CONDITION.  Note: This dictation was prepared with Dragon dictation along with smaller phrase technology. Any transcriptional errors that result from this process are unintentional.  Shaune PollackQing Shelbe Haglund M.D on 07/02/2018 at 11:49 AM  Between 7am to 6pm - Pager - [phone]  After 6pm go to [email or web address] - Social research officer, governmentpassword EPAS [facility]  [place] [facility]  Office  [phone]  CC: Primary care physician; [facility] Mark RuffingRoger D Darwin, male   DOB: 10/18/1945, 73 y.o.   MRN: [id number]

## 2018-07-02 NOTE — Plan of Care (Signed)

## 2018-07-02 NOTE — Progress Notes (Addendum)
Subjective:  CC: Mark Bean is a 73 y.o. male  Hospital stay day 2, Day of Surgery ERCP for acute pancreatitis.  HPI: No specific complaints today.  States pain continues to improve.  ROS:  General: Denies weight loss, weight gain, fatigue, fevers, chills, and night sweats. Heart: Denies chest pain, palpitations, racing heart, irregular heartbeat, leg pain or swelling, and decreased activity tolerance. Respiratory: Denies breathing difficulty, shortness of breath, wheezing, cough, and sputum. GI: Denies change in appetite, heartburn, nausea, vomiting, constipation, diarrhea, and blood in stool. GU: Denies difficulty urinating, pain with urinating, urgency, frequency, blood in urine.   Objective:   Temp:  [98 F (36.7 C)-99.1 F (37.3 C)] 99 F (37.2 C) (06/14 0942) Pulse Rate:  [68-92] 81 (06/14 0957) Resp:  [14-22] 20 (06/14 0957) BP: (131-156)/(60-73) 144/69 (06/14 0957) SpO2:  [91 %-96 %] 93 % (06/14 0957) Weight:  [96.6 kg] 96.6 kg (06/14 0716)     Height: 5\' 7"  (170.2 cm) Weight: 96.6 kg BMI (Calculated): 33.35   Intake/Output this shift:   Intake/Output Summary (Last 24 hours) at 07/02/2018 1229 Last data filed at 07/02/2018 0943 Gross per 24 hour  Intake 800 ml  Output 900 ml  Net -100 ml    Constitutional :  alert, cooperative, appears stated age and no distress  Respiratory:  clear to auscultation bilaterally  Cardiovascular:  regular rate and rhythm  Gastrointestinal: soft, non-tender; bowel sounds normal; no masses,  no organomegaly.   Skin: Cool and moist.   Psychiatric: Normal affect, non-agitated, not confused       LABS:  CMP Latest Ref Rng & Units 07/02/2018 06/30/2018 06/29/2018  Glucose 70 - 99 mg/dL 223(H) 321(H) 181(H)  BUN 8 - 23 mg/dL 35(H) 39(H) 36(H)  Creatinine 0.61 - 1.24 mg/dL 1.21 1.50(H) 1.53(H)  Sodium 135 - 145 mmol/L 142 143 138  Potassium 3.5 - 5.1 mmol/L 3.1(L) 3.8 3.6  Chloride 98 - 111 mmol/L 104 107 105  CO2 22 - 32 mmol/L 29 25  26   Calcium 8.9 - 10.3 mg/dL 8.1(L) 9.1 9.6  Total Protein 6.5 - 8.1 g/dL 5.9(L) 6.7 7.1  Total Bilirubin 0.3 - 1.2 mg/dL 1.9(H) 2.4(H) 0.9  Alkaline Phos 38 - 126 U/L 71 119 85  AST 15 - 41 U/L 54(H) 611(H) 182(H)  ALT 0 - 44 U/L 130(H) 444(H) 102(H)   CBC Latest Ref Rng & Units 06/30/2018 06/29/2018 12/15/2017  WBC 4.0 - 10.5 K/uL 7.4 5.2 4.6  Hemoglobin 13.0 - 17.0 g/dL 12.6(L) 12.7(L) 12.6(L)  Hematocrit 39.0 - 52.0 % 37.8(L) 37.6(L) 38.4(L)  Platelets 150 - 400 K/uL 175 211 185    RADS: n/a Assessment:   Status post ERCP for acute pancreatitis.  No evidence of choledocholithiasis on the procedure today.  However, there was noted to be a strictured area within the duct.  Discussed the case with GI provider, and plan is to await brush cytology results and if negative, will likely obtain an MRI to rule out external mass compressing the duct structure, which may have caused the initial presentation of pancreatitis.    Lap chole can still be considered after full work-up for this strictured area is completed, due to the presence of stones and prevention of gallstone pancreatitis in the future.  Postoperative management including when to resume diet and or DVT prophylaxis per GI team at this point  I discussed ERCP findings with the patient and he verbalized understanding.

## 2018-07-03 ENCOUNTER — Encounter: Payer: Self-pay | Admitting: Gastroenterology

## 2018-07-03 LAB — COMPREHENSIVE METABOLIC PANEL
ALT: 85 U/L — ABNORMAL HIGH (ref 0–44)
AST: 28 U/L (ref 15–41)
Albumin: 2.9 g/dL — ABNORMAL LOW (ref 3.5–5.0)
Alkaline Phosphatase: 62 U/L (ref 38–126)
Anion gap: 7 (ref 5–15)
BUN: 40 mg/dL — ABNORMAL HIGH (ref 8–23)
CO2: 30 mmol/L (ref 22–32)
Calcium: 7.8 mg/dL — ABNORMAL LOW (ref 8.9–10.3)
Chloride: 108 mmol/L (ref 98–111)
Creatinine, Ser: 1.16 mg/dL (ref 0.61–1.24)
GFR calc Af Amer: >60 mL/min (ref >60)
GFR calc non Af Amer: >60 mL/min (ref >60)
Glucose, Bld: 263 mg/dL — ABNORMAL HIGH (ref 70–99)
Potassium: 4 mmol/L (ref 3.5–5.1)
Sodium: 145 mmol/L (ref 135–145)
Total Bilirubin: 1.1 mg/dL (ref 0.3–1.2)
Total Protein: 5.7 g/dL — ABNORMAL LOW (ref 6.5–8.1)

## 2018-07-03 LAB — MAGNESIUM: Magnesium: 2.2 mg/dL (ref 1.7–2.4)

## 2018-07-03 LAB — GLUCOSE, CAPILLARY
Glucose-Capillary: 185 mg/dL — ABNORMAL HIGH (ref 70–99)
Glucose-Capillary: 237 mg/dL — ABNORMAL HIGH (ref 70–99)
Glucose-Capillary: 237 mg/dL — ABNORMAL HIGH (ref 70–99)
Glucose-Capillary: 250 mg/dL — ABNORMAL HIGH (ref 70–99)
Glucose-Capillary: 300 mg/dL — ABNORMAL HIGH (ref 70–99)
Glucose-Capillary: 138 mg/dL — ABNORMAL HIGH (ref 70–99)
Glucose-Capillary: 137 mg/dL — ABNORMAL HIGH (ref 70–99)

## 2018-07-03 MED ORDER — VITAMIN B-1 100 MG PO TABS
100.0000 mg | ORAL_TABLET | Freq: Every day | ORAL | Status: DC
  Administered 2018-07-03 – 2018-07-07 (×3): 100 mg via ORAL
  Filled 2018-07-03 (×3): qty 1

## 2018-07-03 MED ORDER — LORAZEPAM 2 MG/ML IJ SOLN: 1.0000 mg | Freq: Four times a day (QID) | INTRAMUSCULAR | Status: DC | PRN

## 2018-07-03 MED ORDER — THIAMINE HCL 100 MG/ML IJ SOLN
100.0000 mg | Freq: Every day | INTRAMUSCULAR | Status: DC
  Administered 2018-07-05: 100 mg via INTRAVENOUS
  Filled 2018-07-03: qty 2

## 2018-07-03 MED ORDER — INSULIN ASPART 100 UNIT/ML ~~LOC~~ SOLN
0.0000 [IU] | Freq: Three times a day (TID) | SUBCUTANEOUS | Status: DC
  Administered 2018-07-03: 8 [IU] via SUBCUTANEOUS
  Administered 2018-07-03 – 2018-07-05 (×4): 2 [IU] via SUBCUTANEOUS
  Administered 2018-07-06: 8 [IU] via SUBCUTANEOUS
  Administered 2018-07-06: 5 [IU] via SUBCUTANEOUS
  Administered 2018-07-06: 08:00:00 3 [IU] via SUBCUTANEOUS
  Administered 2018-07-07: 11 [IU] via SUBCUTANEOUS
  Administered 2018-07-07: 15 [IU] via SUBCUTANEOUS
  Filled 2018-07-03 (×10): qty 1

## 2018-07-03 MED ORDER — INSULIN ASPART 100 UNIT/ML ~~LOC~~ SOLN
0.0000 [IU] | Freq: Every day | SUBCUTANEOUS | Status: DC
  Administered 2018-07-05 – 2018-07-06 (×2): 3 [IU] via SUBCUTANEOUS
  Filled 2018-07-03 (×2): qty 1

## 2018-07-03 MED ORDER — ALPRAZOLAM 0.25 MG PO TABS: 0.2500 mg | ORAL_TABLET | Freq: Three times a day (TID) | ORAL | Status: DC | PRN

## 2018-07-03 MED ORDER — INSULIN GLARGINE 100 UNIT/ML ~~LOC~~ SOLN
20.0000 [IU] | Freq: Every day | SUBCUTANEOUS | Status: DC
  Administered 2018-07-03 – 2018-07-06 (×3): 20 [IU] via SUBCUTANEOUS
  Filled 2018-07-03 (×6): qty 0.2

## 2018-07-03 MED ORDER — FOLIC ACID 1 MG PO TABS
1.0000 mg | ORAL_TABLET | Freq: Every day | ORAL | Status: DC
  Administered 2018-07-03 – 2018-07-07 (×3): 1 mg via ORAL
  Filled 2018-07-03 (×3): qty 1

## 2018-07-03 MED ORDER — ADULT MULTIVITAMIN W/MINERALS CH
1.0000 | ORAL_TABLET | Freq: Every day | ORAL | Status: DC
  Administered 2018-07-03 – 2018-07-07 (×3): 1 via ORAL
  Filled 2018-07-03 (×3): qty 1

## 2018-07-03 MED ORDER — LORAZEPAM 1 MG PO TABS
1.0000 mg | ORAL_TABLET | Freq: Four times a day (QID) | ORAL | Status: DC | PRN
  Administered 2018-07-03 – 2018-07-04 (×2): 1 mg via ORAL
  Filled 2018-07-03 (×2): qty 1

## 2018-07-03 NOTE — Progress Notes (Addendum)
Mark Darby, MD [address]  [place]  [place], [place] [address]  Main: [phone]  Fax: [phone] Pager: [phone]   Subjective: Patient underwent ERCP yesterday with sphincterotomy and stent placement, no acute events overnight, patient was hungry and tried clear liquids.  Tolerated well.  He denies abdominal pain, fever, chills, nausea or vomiting.  He is asking if he could go home   Objective: Vital signs in last 24 hours: Vitals:   07/02/18 0957 07/02/18 2354 07/03/18 0723 07/03/18 0909  BP: (!) 144/69 (!) 119/48 (!) 131/59 (!) 133/58  Pulse: 81 (!) 53 (!) 51 70  Resp: 20 18 18    Temp:  97.9 F (36.6 C) 98.2 F (36.8 C)   TempSrc:  Oral Oral   SpO2: 93% 96% 93%   Weight:      Height:       Weight change:   Intake/Output Summary (Last 24 hours) at 07/03/2018 1141 Last data filed at 07/03/2018 0500 Gross per 24 hour  Intake 1188.37 ml  Output 1500 ml  Net -311.63 ml     Exam: Heart:: Regular rate and rhythm, S1S2 present or without murmur or extra heart sounds Lungs: normal and clear to auscultation Abdomen: soft, nontender, normal bowel sounds   Lab Results: CBC Latest Ref Rng & Units 06/30/2018 06/29/2018 12/15/2017  WBC 4.0 - 10.5 K/uL 7.4 5.2 4.6  Hemoglobin 13.0 - 17.0 g/dL 12.6(L) 12.7(L) 12.6(L)  Hematocrit 39.0 - 52.0 % 37.8(L) 37.6(L) 38.4(L)  Platelets 150 - 400 K/uL 175 211 185   CMP Latest Ref Rng & Units 07/03/2018 07/02/2018 06/30/2018  Glucose 70 - 99 mg/dL 263(H) 223(H) 321(H)  BUN 8 - 23 mg/dL 40(H) 35(H) 39(H)  Creatinine 0.61 - 1.24 mg/dL 1.16 1.21 1.50(H)  Sodium 135 - 145 mmol/L 145 142 143  Potassium 3.5 - 5.1 mmol/L 4.0 3.1(L) 3.8  Chloride 98 - 111 mmol/L 108 104 107  CO2 22 - 32 mmol/L 30 29 25   Calcium 8.9 - 10.3 mg/dL 7.8(L) 8.1(L) 9.1  Total Protein 6.5 - 8.1 g/dL 5.7(L) 5.9(L) 6.7  Total Bilirubin 0.3 - 1.2 mg/dL 1.1 1.9(H) 2.4(H)  Alkaline Phos 38 - 126 U/L 62 71 119  AST 15 - 41 U/L 28 54(H) 611(H)   ALT 0 - 44 U/L 85(H) 130(H) 444(H)    Micro Results: Recent Results (from the past 240 hour(s))  Novel Coronavirus,NAA,(SEND-OUT TO REF LAB - TAT 24-48 hrs); Hosp Order     Status: None   Collection Time: 06/30/18  1:00 AM   Specimen: Nasopharyngeal Swab; Respiratory  Result Value Ref Range Status   SARS-CoV-2, NAA NOT DETECTED NOT DETECTED Final    Comment: (NOTE) This test was developed and its performance characteristics determined by [employer]. This test has not been FDA cleared or approved. This test has been authorized by FDA under an Emergency Use Authorization (EUA). This test is only authorized for the duration of time the declaration that circumstances exist justifying the authorization of the emergency use of in vitro diagnostic tests for detection of SARS-CoV-2 virus and/or diagnosis of COVID-19 infection under section 564(b)(1) of the Act, 21 U.S.C. 962XBM-8(U)(1), unless the authorization is terminated or revoked sooner. When diagnostic testing is negative, the possibility of a false negative result should be considered in the context of a patient's recent exposures and the presence of clinical signs and symptoms consistent with COVID-19. An individual without symptoms of COVID-19 and who is not shedding SARS-CoV-2 virus would expect to  have a negative (not detected) result in this assay. Performed  At: [facility] [place] [address] [place], [place] [id number] Jolene SchimkeNagendra Sanjai MD JX:[id number]:[phone]    Coronavirus Source NASOPHARYNGEAL  Final    Comment: Performed at [facility], [address] [place], [place] [address]  SARS Coronavirus 2 [facility](Hospital order, Performed in [facility] hospital lab)     Status: None   Collection Time: 06/30/18  2:49 PM   Specimen: Nasopharyngeal Swab  Result Value Ref Range Status   SARS Coronavirus 2 NEGATIVE NEGATIVE Final    Comment: (NOTE) If result is NEGATIVE SARS-CoV-2 target nucleic acids are NOT  DETECTED. The SARS-CoV-2 RNA is generally detectable in upper and lower  respiratory specimens during the acute phase of infection. The lowest  concentration of SARS-CoV-2 viral copies this assay can detect is 250  copies / mL. A negative result does not preclude SARS-CoV-2 infection  and should not be used as the sole basis for treatment or other  patient management decisions.  A negative result may occur with  improper specimen collection / handling, submission of specimen other  than nasopharyngeal swab, presence of viral mutation(s) within the  areas targeted by this assay, and inadequate number of viral copies  (<250 copies / mL). A negative result must be combined with clinical  observations, patient history, and epidemiological information. If result is POSITIVE SARS-CoV-2 target nucleic acids are DETECTED. The SARS-CoV-2 RNA is generally detectable in upper and lower  respiratory specimens dur ing the acute phase of infection.  Positive  results are indicative of active infection with SARS-CoV-2.  Clinical  correlation with patient history and other diagnostic information is  necessary to determine patient infection status.  Positive results do  not rule out bacterial infection or co-infection with other viruses. If result is PRESUMPTIVE POSTIVE SARS-CoV-2 nucleic acids MAY BE PRESENT.   A presumptive positive result was obtained on the submitted specimen  and confirmed on repeat testing.  While 2019 novel coronavirus  (SARS-CoV-2) nucleic acids may be present in the submitted sample  additional confirmatory testing may be necessary for epidemiological  and / or clinical management purposes  to differentiate between  SARS-CoV-2 and other Sarbecovirus currently known to infect humans.  If clinically indicated additional testing with an alternate test  methodology [phone]([id number]) is advised. The SARS-CoV-2 RNA is generally  detectable in upper and lower respiratory sp ecimens during  the acute  phase of infection. The expected result is Negative. Fact Sheet for Patients:  BoilerBrush.com.cyhttps://www.fda.gov/media/136312/download Fact Sheet for Healthcare Providers: https://pope.com/https://www.fda.gov/media/136313/download This test is not yet approved or cleared by the [place] FDA and has been authorized for detection and/or diagnosis of SARS-CoV-2 by FDA under an Emergency Use Authorization (EUA).  This EUA will remain in effect (meaning this test can be used) for the duration of the COVID-19 declaration under Section 564(b)(1) of the Act, 21 U.S.C. section 360bbb-3(b)(1), unless the authorization is terminated or revoked sooner. Performed at [facility], [address] [place], [place] [address]   Culture, blood (routine x 2)     Status: None (Preliminary result)   Collection Time: 07/01/18  1:25 PM   Specimen: BLOOD  Result Value Ref Range Status   Specimen Description BLOOD L ARM  Final   Special Requests   Final    BOTTLES DRAWN AEROBIC AND ANAEROBIC Blood Culture results may not be optimal due to an excessive volume of blood received in culture bottles   Culture   Final  NO GROWTH < 24 HOURS Performed at [facility], [address] [place], [place] [address]    Report Status PENDING  Incomplete  Culture, blood (routine x 2)     Status: None (Preliminary result)   Collection Time: 07/01/18  2:38 PM   Specimen: BLOOD  Result Value Ref Range Status   Specimen Description BLOOD BLOOD LEFT ARM  Final   Special Requests   Final    BOTTLES DRAWN AEROBIC AND ANAEROBIC Blood Culture results may not be optimal due to an excessive volume of blood received in culture bottles   Culture   Final    NO GROWTH < 12 HOURS Performed at [facility], [address] [place], [place] [address]    Report Status PENDING  Incomplete   Studies/Results: Dg C-arm 1-60 Min-no Report  Result Date: 07/02/2018 Fluoroscopy was utilized by the requesting physician.  No  radiographic interpretation.   Medications:  I have reviewed the patient's current medications. Prior to Admission:  Medications Prior to Admission  Medication Sig Dispense Refill Last Dose   amLODipine (NORVASC) 10 MG tablet Take 5 mg by mouth daily.    06/29/2018 at Unknown time   aspirin EC 81 MG tablet Take 81 mg by mouth at bedtime.    06/28/2018 at Unknown time   atorvastatin (LIPITOR) 80 MG tablet Take 80 mg by mouth daily.   06/28/2018 at Unknown time   carvedilol (COREG) 12.5 MG tablet Take 12.5 mg by mouth 2 (two) times daily with a meal.   06/29/2018 at am   chlorthalidone (HYGROTON) 50 MG tablet Take 50 mg by mouth daily.    06/29/2018 at Unknown time   cholecalciferol (VITAMIN D) 1000 units tablet Take 2,000 Units by mouth daily.   06/29/2018 at Unknown time   citalopram (CELEXA) 40 MG tablet Take 20 mg by mouth daily.   06/29/2018 at Unknown time   cyanocobalamin 1000 MCG tablet Take 1,000 mcg by mouth daily.    06/29/2018 at Unknown time   gabapentin (NEURONTIN) 100 MG capsule TAKE 1 CAPSULE BY MOUTH THREE TIMES A DAY (Patient taking differently: Take 100-200 mg by mouth 2 (two) times daily. 100mg  in the morning and 200mg  at bedtime) 90 capsule 3 06/29/2018 at am   insulin aspart (NOVOLOG) 100 unit/mL injection Inject 20 Units into the skin 2 (two) times a day.    06/29/2018 at am   insulin glargine (LANTUS) 100 UNIT/ML injection Inject 28 Units into the skin at bedtime.    06/28/2018 at Unknown time   liraglutide (VICTOZA) 18 MG/3ML SOPN Inject 1.2 mg into the skin at bedtime.   06/28/2018 at Unknown time   lisinopril (PRINIVIL,ZESTRIL) 40 MG tablet Take 40 mg by mouth daily.   06/29/2018 at Unknown time   Melatonin 10 MG TABS Take 10 mg by mouth at bedtime.    06/28/2018 at Unknown time   Omega-3 Fatty Acids (FISH OIL) 1200 MG CAPS Take 1,200 mg by mouth daily.    06/29/2018 at Unknown time   Scheduled:  amLODipine  5 mg Oral Daily   carvedilol  12.5 mg Oral BID WC    citalopram  20 mg Oral Daily   indomethacin  100 mg Rectal Once   insulin aspart  0-15 Units Subcutaneous TID WC   insulin aspart  0-5 Units Subcutaneous QHS   insulin glargine  20 Units Subcutaneous QHS   iohexol  50 mL Oral Once   lisinopril  40 mg Oral Daily  Continuous:  0.9 % NaCl with KCl 40 mEq / L 75 mL/hr at 07/02/18 2115   WUJ:[id number]:acetaminophen **OR** acetaminophen, hydrALAZINE, morphine injection, ondansetron **OR** ondansetron (ZOFRAN) IV, oxyCODONE Anti-infectives (From admission, onward)   None     Scheduled Meds:  amLODipine  5 mg Oral Daily   carvedilol  12.5 mg Oral BID WC   citalopram  20 mg Oral Daily   indomethacin  100 mg Rectal Once   insulin aspart  0-15 Units Subcutaneous TID WC   insulin aspart  0-5 Units Subcutaneous QHS   insulin glargine  20 Units Subcutaneous QHS   iohexol  50 mL Oral Once   lisinopril  40 mg Oral Daily   Continuous Infusions:  0.9 % NaCl with KCl 40 mEq / L 75 mL/hr at 07/02/18 2115   PRN Meds:.acetaminophen **OR** acetaminophen, hydrALAZINE, morphine injection, ondansetron **OR** ondansetron (ZOFRAN) IV, oxyCODONE   Assessment: Principal Problem:   Pancreatitis Active Problems:   Choledocholithiasis   OSA (obstructive sleep apnea)   HTN (hypertension)   CAD (coronary artery disease)   Diabetes (HCC)   AKI (acute kidney injury) (HCC)   Elevated liver enzymes   Stenosis of bile duct  Acute biliary pancreatitis, CBD stricture status post dilation, sphincterotomy and brushings He does not have post ERCP pancreatitis Blood cultures no growth to date  Plan: Advance diet as tolerated Follow-up on cytology Recommend MRI/MRCP to evaluate for any underlying pancreatic or biliary lesions causing CBD stricture Timing of cholecystectomy per general surgery Will need follow-up with Dr. Servando SnareWohl for CBD stent exchange/removal in 2 months   LOS: 3 days   Mark Bean 07/03/2018, 11:41 AM

## 2018-07-03 NOTE — Plan of Care (Signed)

## 2018-07-03 NOTE — Progress Notes (Signed)
[place] [facility] [place] NAME: Mark Bean    MR#:  [id number]  DATE OF BIRTH:  August 13, 1945  SUBJECTIVE:   Patient has no abdominal pain.  Tolerated clear liquid diet this morning. REVIEW OF SYSTEMS:   Review of Systems  Constitutional: Negative for chills, fever and weight loss.  HENT: Negative for ear discharge, ear pain and nosebleeds.   Eyes: Negative for blurred vision, pain and discharge.  Respiratory: Negative for sputum production, shortness of breath, wheezing and stridor.   Cardiovascular: Negative for chest pain, palpitations, orthopnea and PND.  Gastrointestinal: Negative for abdominal pain, diarrhea, nausea and vomiting.  Genitourinary: Negative for frequency and urgency.  Musculoskeletal: Negative for back pain and joint pain.  Neurological: Negative for sensory change, speech change, focal weakness and weakness.  Psychiatric/Behavioral: Negative for depression and hallucinations. The patient is not nervous/anxious.    Tolerating Diet:no   DRUG ALLERGIES:  No Known Allergies  VITALS:  Blood pressure (!) 133/58, pulse 70, temperature 98.2 F (36.8 C), temperature source Oral, resp. rate 18, height 5\' 7"  (1.702 m), weight 96.6 kg, SpO2 93 %.  PHYSICAL EXAMINATION:   Physical Exam  GENERAL:  73 y.o.-year-old patient lying in the bed with no acute distress. Obese. EYES: Pupils equal, round, reactive to light and accommodation. No scleral icterus. Extraocular muscles intact.  HEENT: Head atraumatic, normocephalic. Oropharynx and nasopharynx clear.  NECK:  Supple, no jugular venous distention. No thyroid enlargement, no tenderness.  LUNGS: Normal breath sounds bilaterally, no wheezing, rales, rhonchi. No use of accessory muscles of respiration.  CARDIOVASCULAR: S1, S2 normal. No murmurs, rubs, or gallops.  ABDOMEN: Soft, no tenderness, nondistended. Bowel sounds present. No organomegaly or mass.  EXTREMITIES: No  cyanosis, clubbing or edema b/l.   BKA stump stable NEUROLOGIC: Cranial nerves II through XII are intact. No focal Motor or sensory deficits b/l.   PSYCHIATRIC:  patient is alert and oriented x 3.  SKIN: No obvious rash, lesion, or ulcer.   LABORATORY PANEL:  CBC Recent Labs  Lab 06/30/18 0512  WBC 7.4  HGB 12.6*  HCT 37.8*  PLT 175    Chemistries  Recent Labs  Lab 07/03/18 0351  NA 145  K 4.0  CL 108  CO2 30  GLUCOSE 263*  BUN 40*  CREATININE 1.16  CALCIUM 7.8*  MG 2.2  AST 28  ALT 85*  ALKPHOS 62  BILITOT 1.1   Cardiac Enzymes Recent Labs  Lab 06/29/18 2253  TROPONINI <0.03   RADIOLOGY:  Dg C-arm 1-60 Min-no Report  Result Date: 07/02/2018 Fluoroscopy was utilized by the requesting physician.  No radiographic interpretation.   ASSESSMENT AND PLAN:   Mark Bean  is a 73 y.o. male who presents with anxiety, arthritis, CAD, diabetes  Presents the ED with acute onset of severe abdominal pain rating to his back.  This started earlier today.  He has significant associated nausea with vomiting.  On evaluation here in the ED is found to have a lipase of 4000, with Choledocholithiasis seen on CT imaging  1.  Acute pancreatitis suspected gallstone induced with  choledocholithiasis Continue IV fluids, PO with ice chips and meds -lipase 4000--- [phone]. -increasing LFTs. Hold statins -IV morphine PRN ERCP by Dr. 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                       -  A biliary sphincterotomy was performed.                       - Cells for cytology obtained in the lower third of the                        main duct.                       - One plastic stent was placed into the common bile                        duct. Repeat ERCP in 2 months to remove stent per Dr. Servando SnareWohl. Liver function tests is improving. Advance to full liquid diet today.  MRCP and MRI of abdomen per Dr.  Allegra LaiVanga.  2. Acute cholelithiasis  Aspirin on hold for eventual lap chole per Dr. Tonna BoehringerSakai.  ARF due to dehydration.  Improving with IV fluid support.  Hypokalemia.  Improved with potassium supplement. Hypomagnesemia.  Improved with IV magnesium.  3. Type II diabetes, hyperglycemia, increased Lantus to 20 unit HS, change to moderate sliding scale insulin  4. Hypertension continue home meds  5. CAD continue home meds  6. DVT prophylaxis subcu  SCD.  CODE STATUS: full  DVT Prophylaxis: scd-- pt pending GB surgery. Discussed with Dr. Allegra LaiVanga. TOTAL TIME TAKING CARE OF THIS PATIENT: 28 minutes.  >50% time spent on counselling and coordination of care I discussed with the patient and his wife. POSSIBLE D/C IN 2 DAYS, DEPENDING ON CLINICAL CONDITION.  Note: This dictation was prepared with Dragon dictation along with smaller phrase technology. Any transcriptional errors that result from this process are unintentional.  Mark Bean M.D on 07/03/2018 at 12:04 PM  Between 7am to 6pm - Pager - [phone]  After 6pm go to [email or web address] - Social research officer, governmentpassword [email or web address] [email or web address]  [facility]  Office  [phone]  CC: Primary care physician; [facility]: Mark RuffingRoger D Bean, male   DOB: 02/11/1945, 73 y.o.   MRN: [id number]

## 2018-07-03 NOTE — Care Management Important Message (Signed)
Important Message  Patient Details  Name: Mark Bean MRN: [id number] Date of Birth: [birth year]   Medicare Important Message Given:  Yes    Juliann Pulse A Khalis Hittle 07/03/2018, 10:36 AM

## 2018-07-03 NOTE — Progress Notes (Signed)
Inpatient Diabetes Program Recommendations  AACE/ADA: New Consensus Statement on Inpatient Glycemic Control (2015)  Target Ranges:  Prepandial:   less than 140 mg/dL      Peak postprandial:   less than 180 mg/dL (1-2 hours)      Critically ill patients:  140 - 180 mg/dL   Results for Mark Bean, Mark Bean (MRN [id number]) as of 07/03/2018 08:41  Ref. Range 07/02/2018 00:02 07/02/2018 04:07 07/02/2018 07:39 07/02/2018 10:22 07/02/2018 11:39 07/02/2018 16:35 07/02/2018 21:01  Glucose-Capillary Latest Ref Range: 70 - 99 mg/dL 212 (H)  3 units NOVOLOG  201 (H)  3 units NOVOLOG  184 (H) 170 (H)  2 units NOVOLOG  182 (H) 229 (H)  3 units NOVOLOG  241 (H)  3 units NOVOLOG +  18 units LANTUS    Results for [name], [name] (MRN [id number]) as of 07/03/2018 08:41  Ref. Range 07/03/2018 01:14 07/03/2018 03:46 07/03/2018 07:25  Glucose-Capillary Latest Ref Range: 70 - 99 mg/dL 237 (H)  3 units NOVOLOG  237 (H)  3 units NOVOLOG  250 (H)  3 units NOVOLOG     Admit: Pancreatitis/ Choledocholithiasis -CBD stone seen on CT imaging  History: DM  Home DM Meds: Lantus 28 units QHS                              Novolog 20 units BID                              Victoza 1.2 mg QHS   Current: Novolog Sensitive Correction Scale/ SSI (0-9 units) Q4 hours    Lantus 20 units QHS     MD- Please consider the following in-hospital insulin adjustments:  Increase Novolog SSI to Moderate scale (0-15 units) Q4 hours  Note Lantus increased to 20 units QHS for tonight--May want to go ahead and Increase Lantus to 24 units QHS (30% increase of dose that was given last PM)     --Will follow patient during hospitalization--  Wyn Quaker RN, MSN, CDE Diabetes Coordinator Inpatient Glycemic Control Team Team Pager: [phone] (8a-5p)

## 2018-07-03 NOTE — Progress Notes (Signed)
Pt appears anxious today. MD notified and new orders received. VSS. Family and pt updated on POC.

## 2018-07-04 ENCOUNTER — Inpatient Hospital Stay: Payer: Medicare Other

## 2018-07-04 LAB — BASIC METABOLIC PANEL
Anion gap: 7 (ref 5–15)
BUN: 41 mg/dL — ABNORMAL HIGH (ref 8–23)
CO2: 29 mmol/L (ref 22–32)
Calcium: 7.5 mg/dL — ABNORMAL LOW (ref 8.9–10.3)
Chloride: 104 mmol/L (ref 98–111)
Creatinine, Ser: 1.18 mg/dL (ref 0.61–1.24)
GFR calc Af Amer: >60 mL/min (ref >60)
GFR calc non Af Amer: >60 mL/min (ref >60)
Glucose, Bld: 218 mg/dL — ABNORMAL HIGH (ref 70–99)
Potassium: 3.3 mmol/L — ABNORMAL LOW (ref 3.5–5.1)
Sodium: 140 mmol/L (ref 135–145)

## 2018-07-04 LAB — BLOOD GAS, ARTERIAL
Acid-Base Excess: 3.6 mmol/L — ABNORMAL HIGH (ref 0.0–2.0)
Bicarbonate: 26.3 mmol/L (ref 20.0–28.0)
FIO2: 0.36
O2 Saturation: 94.4 %
Patient temperature: 37
pCO2 arterial: 33 mmHg (ref 32.0–48.0)
pH, Arterial: 7.51 — ABNORMAL HIGH (ref 7.350–7.450)
pO2, Arterial: 65 mmHg — ABNORMAL LOW (ref 83.0–108.0)

## 2018-07-04 LAB — GLUCOSE, CAPILLARY
Glucose-Capillary: 134 mg/dL — ABNORMAL HIGH (ref 70–99)
Glucose-Capillary: 98 mg/dL (ref 70–99)
Glucose-Capillary: 123 mg/dL — ABNORMAL HIGH (ref 70–99)
Glucose-Capillary: 106 mg/dL — ABNORMAL HIGH (ref 70–99)

## 2018-07-04 LAB — LIPASE, BLOOD: Lipase: 27 U/L (ref 11–51)

## 2018-07-04 MED ORDER — GADOBUTROL 1 MMOL/ML IV SOLN
9.0000 mL | Freq: Once | INTRAVENOUS | Status: AC | PRN
  Administered 2018-07-04: 9 mL via INTRAVENOUS

## 2018-07-04 MED ORDER — LORAZEPAM 2 MG/ML IJ SOLN
2.0000 mg | Freq: Four times a day (QID) | INTRAMUSCULAR | Status: DC | PRN
  Administered 2018-07-04 (×2): 2 mg via INTRAVENOUS
  Filled 2018-07-04 (×2): qty 1

## 2018-07-04 MED ORDER — POTASSIUM CHLORIDE CRYS ER 20 MEQ PO TBCR
40.0000 meq | EXTENDED_RELEASE_TABLET | Freq: Once | ORAL | Status: AC
  Administered 2018-07-04: 40 meq via ORAL
  Filled 2018-07-04: qty 2

## 2018-07-04 MED ORDER — DIPHENHYDRAMINE HCL 50 MG/ML IJ SOLN
12.5000 mg | Freq: Once | INTRAMUSCULAR | Status: AC
  Administered 2018-07-04: 12.5 mg via INTRAVENOUS
  Filled 2018-07-04: qty 1

## 2018-07-04 MED ORDER — ENOXAPARIN SODIUM 40 MG/0.4ML ~~LOC~~ SOLN
40.0000 mg | SUBCUTANEOUS | Status: DC
  Administered 2018-07-04 – 2018-07-06 (×3): 40 mg via SUBCUTANEOUS
  Filled 2018-07-04 (×3): qty 0.4

## 2018-07-04 MED ORDER — HALOPERIDOL LACTATE 5 MG/ML IJ SOLN
2.0000 mg | Freq: Once | INTRAMUSCULAR | Status: AC
  Administered 2018-07-04: 2 mg via INTRAVENOUS
  Filled 2018-07-04 (×2): qty 1

## 2018-07-04 MED ORDER — LORAZEPAM 2 MG/ML IJ SOLN
2.0000 mg | Freq: Once | INTRAMUSCULAR | Status: AC
  Administered 2018-07-05: 2 mg via INTRAVENOUS
  Filled 2018-07-04: qty 1

## 2018-07-04 MED ORDER — HALOPERIDOL LACTATE 5 MG/ML IJ SOLN
5.0000 mg | Freq: Once | INTRAMUSCULAR | Status: AC
  Administered 2018-07-04: 5 mg via INTRAVENOUS
  Filled 2018-07-04: qty 1

## 2018-07-04 MED ORDER — LORAZEPAM 2 MG PO TABS: 2.0000 mg | ORAL_TABLET | Freq: Four times a day (QID) | ORAL | Status: DC | PRN

## 2018-07-04 MED ORDER — ZIPRASIDONE MESYLATE 20 MG IM SOLR
10.0000 mg | Freq: Once | INTRAMUSCULAR | Status: AC
  Administered 2018-07-04: 10 mg via INTRAMUSCULAR
  Filled 2018-07-04: qty 20

## 2018-07-04 NOTE — Progress Notes (Signed)
Pt agitated trying to get out of bed and being combative. One time dose of Haldol given IV. Pt now resting quietly in room. Sitter at bedside. Pt on 3L O2 acute. Pt denies any pain at this time. Will continue to monitor.

## 2018-07-04 NOTE — Progress Notes (Signed)
Patient continues to become increasingly confused, impulsive and agitated. Pt not cooperative and attempting to get out of bed. MD notified and new orders placed. will continue to monitor.

## 2018-07-04 NOTE — Progress Notes (Signed)
MD notified of patients continued aggitation and aggressive behavior. New orders placed. Will continue to monitor.

## 2018-07-04 NOTE — Progress Notes (Signed)
[place] [facility] [place] NAME: Mark Bean    MR#:  [id number]  DATE OF BIRTH:  [birth year]  SUBJECTIVE:   Patient has agitation, on one-to-one sitter. REVIEW OF SYSTEMS:   Review of Systems  Unable to perform ROS: Mental status change   DRUG ALLERGIES:  No Known Allergies  VITALS:  Blood pressure (!) 147/63, pulse 75, temperature 99.8 F (37.7 C), temperature source Oral, resp. rate 20, height 5\' 7"  (1.702 m), weight 96.6 kg, SpO2 94 %.  PHYSICAL EXAMINATION:   Physical Exam  GENERAL:  73 y.o.-year-old patient lying in the bed with no acute distress. Obese. EYES: Pupils equal, round, reactive to light and accommodation. No scleral icterus. Extraocular muscles intact.  HEENT: Head atraumatic, normocephalic. NECK:  Supple, no jugular venous distention. No thyroid enlargement, no tenderness.  LUNGS: Normal breath sounds bilaterally, no wheezing, rales, rhonchi. No use of accessory muscles of respiration.  CARDIOVASCULAR: S1, S2 normal. No murmurs, rubs, or gallops.  ABDOMEN: Soft, no tenderness, nondistended. Bowel sounds present. No organomegaly or mass.  EXTREMITIES: No cyanosis, clubbing or edema b/l.   BKA stump stable NEUROLOGIC: Cranial nerves II through XII are intact. No focal Motor or sensory deficits b/l.   PSYCHIATRIC:  patient is confused. SKIN: No obvious rash, lesion, or ulcer.   LABORATORY PANEL:  CBC Recent Labs  Lab 06/30/18 0512  WBC 7.4  HGB 12.6*  HCT 37.8*  PLT 175    Chemistries  Recent Labs  Lab 07/03/18 0351 07/04/18 0258  NA 145 140  K 4.0 3.3*  CL 108 104  CO2 30 29  GLUCOSE 263* 218*  BUN 40* 41*  CREATININE 1.16 1.18  CALCIUM 7.8* 7.5*  MG 2.2  --   AST 28  --   ALT 85*  --   ALKPHOS 62  --   BILITOT 1.1  --    Cardiac Enzymes Recent Labs  Lab 06/29/18 2253  TROPONINI <0.03   RADIOLOGY:  No results found. ASSESSMENT AND PLAN:   Mark Bean  is a 73 y.o. male who  presents with anxiety, arthritis, CAD, diabetes  Presents the ED with acute onset of severe abdominal pain rating to his back.  This started earlier today.  He has significant associated nausea with vomiting.  On evaluation here in the ED is found to have a lipase of 4000, with Choledocholithiasis seen on CT imaging  1.  Acute pancreatitis suspected gallstone induced with  choledocholithiasis Continue IV fluids, PO with ice chips and meds -lipase 4000--- [phone]. -increasing LFTs. Hold statins -IV morphine PRN ERCP by Dr. 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                       - A biliary sphincterotomy was performed.                       - Cells for cytology obtained in the lower third of the                        main duct.                       - One  plastic stent was placed into the common bile                        duct. Repeat ERCP in 2 months to remove stent per Dr. Servando SnareWohl. Liver function tests is improving. He tolerated full liquid diet.  MRCP and MRI of abdomen per Dr. Allegra LaiVanga.  2. Acute cholelithiasis  Aspirin on hold for eventual lap chole per Dr. Tonna BoehringerSakai.  ARF due to dehydration.  Improving with IV fluid support.  Hypokalemia. Potassium supplement. Hypomagnesemia.  Improved with IV magnesium.  3. Type II diabetes, hyperglycemia, increased Lantus to 20 unit HS, change to moderate sliding scale insulin  4. Hypertension continue home meds  5. CAD continue home meds  6.  Confusion and agitation, possible alcohol withdrawal, Ativan as needed, CIWA protocol.  CODE STATUS: full  DVT Prophylaxis: scd-- pt pending GB surgery. Discussed with Dr. Allegra LaiVanga. TOTAL TIME TAKING CARE OF THIS PATIENT: 28 minutes.  >50% time spent on counselling and coordination of care I discussed with the patient and his wife. POSSIBLE D/C IN 2 DAYS, DEPENDING ON CLINICAL CONDITION.  Note: This dictation was  prepared with Dragon dictation along with smaller phrase technology. Any transcriptional errors that result from this process are unintentional.  Shaune PollackQing Nehemyah Foushee M.D on 07/04/2018 at 11:28 AM  Between 7am to 6pm - Pager - [phone]  After 6pm go to [email or web address] - Social research officer, governmentpassword EPAS [facility]  [facility]  Office  [phone]  CC: Primary care physician; [facility]: [facility], male   DOB: 09/11/1945, 73 y.o.   MRN: [id number]

## 2018-07-04 NOTE — Progress Notes (Addendum)
Pt continues to attempt to get out of bed and remains confused. MD notified, order for safety sitter placed. No sitter available at this time.

## 2018-07-05 ENCOUNTER — Other Ambulatory Visit: Payer: Medicare Other

## 2018-07-05 ENCOUNTER — Inpatient Hospital Stay: Payer: Medicare Other

## 2018-07-05 ENCOUNTER — Inpatient Hospital Stay (HOSPITAL_COMMUNITY)
Admit: 2018-07-05 | Discharge: 2018-07-05 | Disposition: A | Discharge status: Home / Self Care | Payer: Medicare Other | Attending: Pulmonary Disease | Admitting: Pulmonary Disease

## 2018-07-05 DIAGNOSIS — G9340 Encephalopathy, unspecified: Secondary | ICD-10-CM

## 2018-07-05 DIAGNOSIS — I361 Nonrheumatic tricuspid (valve) insufficiency: Secondary | ICD-10-CM

## 2018-07-05 DIAGNOSIS — K8500 Idiopathic acute pancreatitis without necrosis or infection: Secondary | ICD-10-CM

## 2018-07-05 DIAGNOSIS — N179 Acute kidney failure, unspecified: Secondary | ICD-10-CM

## 2018-07-05 LAB — URINALYSIS, COMPLETE (UACMP) WITH MICROSCOPIC
Bacteria, UA: NONE SEEN
Bilirubin Urine: NEGATIVE
Glucose, UA: NEGATIVE mg/dL
Ketones, ur: 5 mg/dL — AB
Leukocytes,Ua: NEGATIVE
Nitrite: NEGATIVE
Protein, ur: NEGATIVE mg/dL
Specific Gravity, Urine: 1.008 (ref 1.005–1.030)
Squamous Epithelial / LPF: NONE SEEN (ref 0–5)
pH: 5 (ref 5.0–8.0)

## 2018-07-05 LAB — CBC WITH DIFFERENTIAL/PLATELET
Abs Immature Granulocytes: 0.06 10*3/uL (ref 0.00–0.07)
Basophils Absolute: 0 10*3/uL (ref 0.0–0.1)
Basophils Relative: 0 %
Eosinophils Absolute: 0.1 10*3/uL (ref 0.0–0.5)
Eosinophils Relative: 1 %
HCT: 33.8 % — ABNORMAL LOW (ref 39.0–52.0)
Hemoglobin: 11 g/dL — ABNORMAL LOW (ref 13.0–17.0)
Immature Granulocytes: 1 %
Lymphocytes Relative: 8 %
Lymphs Abs: 0.6 10*3/uL — ABNORMAL LOW (ref 0.7–4.0)
MCH: 29.3 pg (ref 26.0–34.0)
MCHC: 32.5 g/dL (ref 30.0–36.0)
MCV: 89.9 fL (ref 80.0–100.0)
Monocytes Absolute: 0.6 10*3/uL (ref 0.1–1.0)
Monocytes Relative: 7 %
Neutro Abs: 6.9 10*3/uL (ref 1.7–7.7)
Neutrophils Relative %: 83 %
Platelets: 185 10*3/uL (ref 150–400)
RBC: 3.76 MIL/uL — ABNORMAL LOW (ref 4.22–5.81)
RDW: 13.2 % (ref 11.5–15.5)
WBC: 8.3 10*3/uL (ref 4.0–10.5)
nRBC: 0 % (ref 0.0–0.2)

## 2018-07-05 LAB — COMPREHENSIVE METABOLIC PANEL
ALT: 49 U/L — ABNORMAL HIGH (ref 0–44)
AST: 30 U/L (ref 15–41)
Albumin: 2.9 g/dL — ABNORMAL LOW (ref 3.5–5.0)
Alkaline Phosphatase: 77 U/L (ref 38–126)
Anion gap: 12 (ref 5–15)
BUN: 28 mg/dL — ABNORMAL HIGH (ref 8–23)
CO2: 23 mmol/L (ref 22–32)
Calcium: 7.6 mg/dL — ABNORMAL LOW (ref 8.9–10.3)
Chloride: 108 mmol/L (ref 98–111)
Creatinine, Ser: 1.03 mg/dL (ref 0.61–1.24)
GFR calc Af Amer: >60 mL/min (ref >60)
GFR calc non Af Amer: >60 mL/min (ref >60)
Glucose, Bld: 116 mg/dL — ABNORMAL HIGH (ref 70–99)
Potassium: 3.3 mmol/L — ABNORMAL LOW (ref 3.5–5.1)
Sodium: 143 mmol/L (ref 135–145)
Total Bilirubin: 1.3 mg/dL — ABNORMAL HIGH (ref 0.3–1.2)
Total Protein: 5.8 g/dL — ABNORMAL LOW (ref 6.5–8.1)

## 2018-07-05 LAB — URINE DRUG SCREEN, QUALITATIVE (ARMC ONLY)
Amphetamines, Ur Screen: NOT DETECTED
Barbiturates, Ur Screen: NOT DETECTED
Benzodiazepine, Ur Scrn: POSITIVE — AB
Cannabinoid 50 Ng, Ur ~~LOC~~: NOT DETECTED
Cocaine Metabolite,Ur ~~LOC~~: NOT DETECTED
MDMA (Ecstasy)Ur Screen: NOT DETECTED
Methadone Scn, Ur: NOT DETECTED
Opiate, Ur Screen: NOT DETECTED
Phencyclidine (PCP) Ur S: NOT DETECTED
Tricyclic, Ur Screen: NOT DETECTED

## 2018-07-05 LAB — GLUCOSE, CAPILLARY
Glucose-Capillary: 106 mg/dL — ABNORMAL HIGH (ref 70–99)
Glucose-Capillary: 112 mg/dL — ABNORMAL HIGH (ref 70–99)
Glucose-Capillary: 105 mg/dL — ABNORMAL HIGH (ref 70–99)
Glucose-Capillary: 150 mg/dL — ABNORMAL HIGH (ref 70–99)
Glucose-Capillary: 294 mg/dL — ABNORMAL HIGH (ref 70–99)

## 2018-07-05 LAB — BRAIN NATRIURETIC PEPTIDE: B Natriuretic Peptide: 874 pg/mL — ABNORMAL HIGH (ref 0.0–100.0)

## 2018-07-05 LAB — ECHOCARDIOGRAM COMPLETE
Height: 68 in
Weight: 3530.89 oz

## 2018-07-05 LAB — MAGNESIUM: Magnesium: 2.1 mg/dL (ref 1.7–2.4)

## 2018-07-05 LAB — LIPASE, BLOOD: Lipase: 19 U/L (ref 11–51)

## 2018-07-05 LAB — AMMONIA: Ammonia: <9 umol/L — ABNORMAL LOW (ref 9–35)

## 2018-07-05 LAB — PHOSPHORUS: Phosphorus: 1.5 mg/dL — ABNORMAL LOW (ref 2.5–4.6)

## 2018-07-05 LAB — TSH: TSH: 2.698 u[IU]/mL (ref 0.350–4.500)

## 2018-07-05 MED ORDER — ZIPRASIDONE MESYLATE 20 MG IM SOLR
10.0000 mg | Freq: Once | INTRAMUSCULAR | Status: AC
  Administered 2018-07-05: 10 mg via INTRAMUSCULAR

## 2018-07-05 MED ORDER — POTASSIUM CHLORIDE 10 MEQ/100ML IV SOLN
10.0000 meq | INTRAVENOUS | Status: AC
  Administered 2018-07-05 (×4): 10 meq via INTRAVENOUS
  Filled 2018-07-05 (×4): qty 100

## 2018-07-05 MED ORDER — DEXMEDETOMIDINE HCL IN NACL 200 MCG/50ML IV SOLN: 0.2000 ug/kg/h | INTRAVENOUS | Status: DC

## 2018-07-05 MED ORDER — THIAMINE HCL 100 MG/ML IJ SOLN
Freq: Once | INTRAVENOUS | Status: AC
  Administered 2018-07-05: 16:00:00 via INTRAVENOUS
  Filled 2018-07-05: qty 1000

## 2018-07-05 MED ORDER — DEXMEDETOMIDINE HCL IN NACL 400 MCG/100ML IV SOLN
0.2000 ug/kg/h | INTRAVENOUS | Status: DC
  Administered 2018-07-05: 0.8 ug/kg/h via INTRAVENOUS
  Administered 2018-07-05: 0.2 ug/kg/h via INTRAVENOUS
  Filled 2018-07-05: qty 100

## 2018-07-05 MED ORDER — FUROSEMIDE 10 MG/ML IJ SOLN
40.0000 mg | Freq: Once | INTRAMUSCULAR | Status: AC
  Administered 2018-07-05: 04:00:00 40 mg via INTRAVENOUS
  Filled 2018-07-05: qty 4

## 2018-07-05 MED ORDER — POTASSIUM PHOSPHATES 15 MMOLE/5ML IV SOLN
30.0000 mmol | Freq: Once | INTRAVENOUS | Status: AC
  Administered 2018-07-05: 30 mmol via INTRAVENOUS
  Filled 2018-07-05: qty 10

## 2018-07-05 NOTE — Progress Notes (Signed)
eLink Physician-Brief Progress Note Patient Name: DESMEN SCHOFFSTALL DOB: February 10, 1945 MRN: [id number]   Date of Service  07/05/2018  HPI/Events of Note  73 year old man with pancreatitis, on the ward. Has developed severe agitated delirium for which a precedex drip is being started. Dr Jannifer Franklin called me about the case.   eICU Interventions  Bedside ICU NP notified with plans for ICU transfer. Hemodynamics stable.      Intervention Category Major Interventions: Delirium, psychosis, severe agitation - evaluation and management Evaluation Type: New Patient Evaluation  Toney Lizaola G Meili Kleckley 07/05/2018, 1:20 AM

## 2018-07-05 NOTE — Progress Notes (Signed)
[facility] - [place] [facility]   PATIENT NAME: Mark Bean    MR#:  [id number]  DATE OF BIRTH:  12/11/1945  SUBJECTIVE:   Transfer to stepdown unit for Precedex drip overnight due to severe agitation. Sitter at bedside REVIEW OF SYSTEMS:   Review of Systems  Unable to perform ROS: Mental status change   DRUG ALLERGIES:  No Known Allergies  VITALS:  Blood pressure 116/62, pulse (!) 52, temperature 98.2 F (36.8 C), temperature source Axillary, resp. rate 20, height 5\' 8"  (1.727 m), weight 100.1 kg, SpO2 95 %.  PHYSICAL EXAMINATION:   Physical Exam  GENERAL:  73 y.o.-year-old patient lying in the bed. Obese. EYES: Pupils equal, round, reactive to light and accommodation. No scleral icterus. Extraocular muscles intact.  HEENT: Head atraumatic, normocephalic. NECK:  Supple, no jugular venous distention. No thyroid enlargement, no tenderness.  LUNGS: Normal breath sounds bilaterally, no wheezing, rales, rhonchi. No use of accessory muscles of respiration.  CARDIOVASCULAR: S1, S2 normal. No murmurs, rubs, or gallops.  ABDOMEN: Soft, no tenderness, nondistended. Bowel sounds present. No organomegaly or mass.  EXTREMITIES: No cyanosis, clubbing or edema b/l.   Right BKA  NEUROLOGIC: Not following instructions PSYCHIATRIC:  patient is drowzy SKIN: No obvious rash, lesion, or ulcer.   LABORATORY PANEL:  CBC Recent Labs  Lab 07/05/18 0332  WBC 8.3  HGB 11.0*  HCT 33.8*  PLT 185    Chemistries  Recent Labs  Lab 07/05/18 0332  NA 143  K 3.3*  CL 108  CO2 23  GLUCOSE 116*  BUN 28*  CREATININE 1.03  CALCIUM 7.6*  MG 2.1  AST 30  ALT 49*  ALKPHOS 77  BILITOT 1.3*   Cardiac Enzymes Recent Labs  Lab 06/29/18 2253  TROPONINI <0.03   RADIOLOGY:  Ct Head Wo Contrast  Result Date: 07/04/2018 CLINICAL DATA:  73 y/o  M; increasing confusion. EXAM: CT HEAD WITHOUT CONTRAST TECHNIQUE: Contiguous axial images were obtained from the base  of the skull through the vertex without intravenous contrast. COMPARISON:  12/09/2014 CT of the head. FINDINGS: Brain: No evidence of acute infarction, hemorrhage, hydrocephalus, extra-axial collection or mass lesion/mass effect. Stable chronic microvascular ischemic changes and volume loss of the brain. Vascular: Calcific atherosclerosis of carotid siphons. No hyperdense vessel. Skull: Normal. Negative for fracture or focal lesion. Sinuses/Orbits: No acute finding. Other: Bilateral intra-ocular lens replacement. IMPRESSION: 1. No acute intracranial abnormality identified. 2. Stable chronic microvascular ischemic changes and volume loss of the brain. Electronically Signed   By: Mitzi HansenLance  Furusawa-Stratton M.D.   On: 07/04/2018 23:24   Dg Chest Port 1 View  Result Date: 07/05/2018 CLINICAL DATA:  Hypoxia. EXAM: PORTABLE CHEST 1 VIEW COMPARISON:  Chest radiograph 06/29/2018 FINDINGS: Lower lung volumes from prior exam. Post median sternotomy. Borderline cardiomegaly. Bilateral pleural effusions and compressive atelectasis. Vascular congestion. No pneumothorax. Chronic changes of both shoulders. IMPRESSION: Lower lung volumes from prior exam with vascular congestion. Bilateral pleural effusions and compressive atelectasis. Findings suggest fluid overload. Electronically Signed   By: Narda RutherfordMelanie  Sanford M.D.   On: 07/05/2018 02:44   Mr Abdomen Mrcp Vivien RossettiW Wo Contast  Result Date: 07/05/2018 CLINICAL DATA:  Abdominal pain radiating to the back with nausea and vomiting, acute pancreatitis. EXAM: MRI ABDOMEN WITHOUT AND WITH CONTRAST (INCLUDING MRCP) TECHNIQUE: Multiplanar multisequence MR imaging of the abdomen was performed both before and after the administration of intravenous contrast. Heavily T2-weighted images of the biliary and pancreatic ducts were obtained, and three-dimensional MRCP images  were rendered by post processing. CONTRAST:  9 cc Gadavist COMPARISON:  Multiple exams, including 06/29/2018 CT and 06/30/2018  ultrasound FINDINGS: Despite efforts by the technologist and patient, motion artifact is present on today's exam and could not be eliminated. This reduces exam sensitivity and specificity. Lower chest: Small to moderate bilateral pleural effusions with associated passive atelectasis. Prior median sternotomy. Hepatobiliary: Currently no biliary dilatation. Tiny gallstone in the gallbladder. 1.5 by 1.4 cm dense calcified extracapsular lesion along the posterior capsular margin of the right hepatic lobe on image 20/3, not enhancing, etiology uncertain. No significant focal liver lesion identified. No definite choledocholithiasis is confirmed on today's study. Pancreas: Moderate pancreatic and peripancreatic edema compatible with acute pancreatitis. No pseudocyst, abscess, or findings of pancreatic necrosis. Spleen:  Unremarkable Adrenals/Urinary Tract: Very small T2 hyperintense not appreciably enhancing lesions in both kidneys are likely tiny cysts. The adrenal glands appear normal. Stomach/Bowel: Unremarkable Vascular/Lymphatic: Aortoiliac atherosclerotic vascular disease. Patent portal vein and splenic vein. No appreciable pathologic adenopathy. Other:  No supplemental non-categorized findings. Musculoskeletal: Unremarkable IMPRESSION: 1. Moderate pancreatic peripancreatic edema compatible with acute pancreatitis. No pancreatic pseudocyst, abscess, or pancreatic necrosis identified at this time. 2. Small to moderate bilateral pleural effusions with passive atelectasis. 3. Tiny gallstone in the gallbladder. No current biliary dilatation. 4. 1.5 cm densely calcified lesion along the posterior capsular margin of the right hepatic lobe. This is been present at least since 2015 and could be a calcified exophytic cyst or focus of calcified fat necrosis. 5.  Aortic Atherosclerosis (ICD10-I70.0). 6. Despite efforts by the technologist and patient, motion artifact is present on today's exam and could not be eliminated. This  reduces exam sensitivity and specificity. Electronically Signed   By: Van Clines M.D.   On: 07/05/2018 08:20   ASSESSMENT AND PLAN:   Romon Devereux  is a 73 y.o. male who presents with anxiety, arthritis, CAD, diabetes  Presents the ED with acute onset of severe abdominal pain rating to his back.  This started earlier today.  He has significant associated nausea with vomiting.  On evaluation here in the ED is found to have a lipase of 4000, with Choledocholithiasis seen on CT imaging  1.  Acute pancreatitis suspected gallstone induced with  choledocholithiasis Continue IV fluids, PO with ice chips and meds -lipase 4000--- [phone]. -increasing LFTs. Hold statins -IV morphine PRN ERCP by Dr. 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                       - A biliary sphincterotomy was performed.                       - Cells for cytology obtained in the lower third of the                        main duct.                       - One plastic stent was placed into the common bile                        duct. Repeat ERCP in 2 months to remove stent per Dr. Allen Norris.  Liver function tests is improving. He tolerated full liquid diet.  MRCP and MRI of abdomen showed no mass.  2. Acute cholelithiasis  Aspirin on hold for eventual lap chole per Dr. Tonna BoehringerSakai. Waiting for cholecystectomy  ARF due to dehydration.  Improving with IV fluid support.  Hypokalemia. Potassium supplementation Hypomagnesemia.  Improved with IV magnesium.  3. Type II diabetes, hyperglycemia, increased Lantus to 20 unit HS, change to moderate sliding scale insulin  4. Hypertension continue home meds  5. CAD continue home meds  6.  Acute metabolic encephalopathy.  Etiology unclear.  Alcohol withdrawal considered but according to family patient drinks only 1-2 beers a month. Ammonia normal.  CT head negative.  Likely inpatient  delirium.  CODE STATUS: full  DVT Prophylaxis: scd-- pt pending GB surgery.  TOTAL TIME TAKING CARE OF THIS PATIENT: 30 minutes.   Note: This dictation was prepared with Dragon dictation along with smaller phrase technology. Any transcriptional errors that result from this process are unintentional.  Orie FishermanSrikar R Zekiel Torian M.D on 07/05/2018 at 12:38 PM  Between 7am to 6pm - Pager - [phone]  After 6pm go to [email or web address] - Social research officer, governmentpassword EPAS [facility]  [facility] [place] [facility]  Office  [phone]  CC: Primary care physician; [facility] ID: Sherry RuffingRoger D Bean, male   DOB: 08/04/1945, 73 y.o.   MRN: [id number]

## 2018-07-05 NOTE — Consult Note (Signed)
Name: Mark RuffingRoger D Bean MRN: [id number] DOB: 08/10/1945    ADMISSION DATE:  06/29/2018 CONSULTATION DATE:  07/05/2018  REFERRING MD :  Dr. Anne HahnWillis  CHIEF COMPLAINT:  Acute Delirium & Agitation  BRIEF PATIENT DESCRIPTION:  73 y.o Male admitted on 6/12 w/ Acute Pancreatitis, choledocholithiasis, and Cholelithiasis.  He underwent ERCP on 6/14 with sphincterotomy and stent was placed into common bile duct.  Pt developed acute delirium and agitation on 6/17 requiring transfer to Stepdown unit for Precedex drip.  Questionable DT's, however pt's wife reports he doesn't drink.  CT Head is negative 6/17.  Upon transfer to, Pt also noted to have acute hypoxic respiratory failure in the setting of acute pulmonary edema, bilateral pulmonary effusions, and atelectasis.  SIGNIFICANT EVENTS  6/12>> Admitted to Stepdown unit 6/14>> ERCP performed by Dr. Servando SnareWohl 6/17>> Developed Delirium and agitation [facility], along with Acute Hypoxic Respiratory Failure  STUDIES:  CT Abdomen & Pelvis 6/11>> 1. Findings consistent with acute uncomplicated pancreatitis. 2. Findings suspicious for choledocholithiasis with a small stone at the distal CBD. 3. Cholelithiasis. There is mild intrahepatic and extrahepatic biliary ductal dilatation. There is no CT evidence of acute cholecystitis. 4. Borderline splenomegaly. MR Abdomen 6/16>> CT Head w/o Contrast 6/16>> 1. No acute intracranial abnormality identified. 2. Stable chronic microvascular ischemic changes and volume loss of the brain. Echocardiogram 6/17>> EEG 6/17>>  CULTURES: COVID-19 PCR 6/12>> Negative Repeat COVID-19 PCR 6/12>> Negative Blood x2 6/13>>  ANTIBIOTICS: N/A  HISTORY OF PRESENT ILLNESS:   Mark Bean is a 73 year old male with a past medical history history as noted below who [facility] with complaints of severe abdominal pain radiating to his back, along with associated nausea and  vomiting.  He was found to have acute pancreatitis, Choledocholithiasis, and Cholithiasis.  Gastroenterology and general surgery were consulted and are following patient. On  6/14 Dr. Servando SnareWohl performed an ERCP with sphincterotomy and stent was placed into common bile duct.  MR Abdomen was performed on 6/16, results are currently pending.  During the day on 6/16 he developed agitation and delirium requiring safety sitter, Ativan, and Geodon.  There was concern for possible alcohol withdrawal, however pt's wife reports that he only drinks 1-2 beers per month.  Early in the morning 6/17 his agitation and delirium worsened requiring transfer to ICU for precedex infusion.  CT Head is negative, ABG with hypoxia (pO2 65).  CMP without any obvious metabolic cause.  Urinalysis, urine drug screen, and EEG are pending.  Given pt's hypoxia, CXR obtained which is concerning for pulmonary edema and atelectasis, BNP 874.  IV Fluids were held and he received 40 mg Lasix x1 dose.  [facility] workup and treatment of Acute Delirium requiring Precedex drip and Acute Hypoxic Respiratory Failure in setting of Acute Pulmonary Edema.  PCCM is consulted for further management.  PAST MEDICAL HISTORY :   has a past medical history of Anxiety, Arthritis, Coronary artery disease, Diabetes mellitus without complication (HCC), Exposure to Agent Orange, Hypertension, Kidney stones, Myocardial infarction [facility](HCC), Pancreatitis, acute, and Sleep apnea.  has a past surgical history that includes Cardiac catheterization; Coronary artery bypass graft; Amputation (Right, 10/11/2012); Ankle Fusion (Right, 10/11/2012); Eye surgery (Right); Joint replacement (Left); Colonoscopy; Vasectomy; I&D extremity (Right, 11/10/2012); Hardware Removal (Right, 01/05/2013); Tonsillectomy; stents; Amputation (Right, 06/15/2013); Replacement total knee (Right, 02/15/2011); Amputation (Right, 12/03/2015); Application if wound vac  (12/03/2015); Coronary angioplasty; Cataract extraction w/PHACO (Right, 04/06/2017);  and Endoscopic retrograde cholangiopancreatography (ercp) with propofol (N/A, 07/02/2018). Prior to Admission medications   Medication Sig Start Date End Date Taking? Authorizing Provider  amLODipine (NORVASC) 10 MG tablet Take 5 mg by mouth daily.  11/25/17 11/25/18 Yes [provider]  aspirin EC 81 MG tablet Take 81 mg by mouth at bedtime.    Yes [provider]  atorvastatin (LIPITOR) 80 MG tablet Take 80 mg by mouth daily.   Yes [provider]  carvedilol (COREG) 12.5 MG tablet Take 12.5 mg by mouth 2 (two) times daily with a meal.   Yes [provider]  chlorthalidone (HYGROTON) 50 MG tablet Take 50 mg by mouth daily.    Yes [provider]  cholecalciferol (VITAMIN D) 1000 units tablet Take 2,000 Units by mouth daily.   Yes [provider]  citalopram (CELEXA) 40 MG tablet Take 20 mg by mouth daily.   Yes [provider]  cyanocobalamin 1000 MCG tablet Take 1,000 mcg by mouth daily.    Yes [provider]  gabapentin (NEURONTIN) 100 MG capsule TAKE 1 CAPSULE BY MOUTH THREE TIMES A DAY Patient taking differently: Take 100-200 mg by mouth 2 (two) times daily. 100mg  in the morning and 200mg  at bedtime 01/03/17  Yes Newt Minion, MD  insulin aspart (NOVOLOG) 100 unit/mL injection Inject 20 Units into the skin 2 (two) times a day.    Yes [provider]  insulin glargine (LANTUS) 100 UNIT/ML injection Inject 28 Units into the skin at bedtime.    Yes [provider]  liraglutide (VICTOZA) 18 MG/3ML SOPN Inject 1.2 mg into the skin at bedtime.   Yes [provider]  lisinopril (PRINIVIL,ZESTRIL) 40 MG tablet Take 40 mg by mouth daily.   Yes [provider]  Melatonin 10 MG TABS Take 10 mg by mouth at bedtime.    Yes [provider]  Omega-3 Fatty Acids (FISH OIL) 1200 MG CAPS Take 1,200 mg by mouth  daily.    Yes [provider]   No Known Allergies  FAMILY HISTORY:  family history is not on file. SOCIAL HISTORY:  reports that he has never smoked. He has never used smokeless tobacco. He reports current alcohol use of about 12.0 standard drinks of alcohol per week. He reports that he does not use drugs.   REVIEW OF SYSTEMS:  Positives in BOLD: Pt denies all complaints Constitutional: Negative for fever, chills, weight loss, malaise/fatigue and diaphoresis.  HENT: Negative for hearing loss, ear pain, nosebleeds, congestion, sore throat, neck pain, tinnitus and ear discharge.   Eyes: Negative for blurred vision, double vision, photophobia, pain, discharge and redness.  Respiratory: Negative for cough, hemoptysis, sputum production, shortness of breath, wheezing and stridor.   Cardiovascular: Negative for chest pain, palpitations, orthopnea, claudication, leg swelling and PND.  Gastrointestinal: Negative for heartburn, nausea, vomiting, abdominal pain, diarrhea, constipation, blood in stool and melena.  Genitourinary: Negative for dysuria, urgency, frequency, hematuria and flank pain.  Musculoskeletal: Negative for myalgias, back pain, joint pain and falls.  Skin: Negative for itching and rash.  Neurological: Negative for dizziness, tingling, tremors, sensory change, speech change, focal weakness, seizures, loss of consciousness, weakness and headaches.  Endo/Heme/Allergies: Negative for environmental allergies and polydipsia. Does not bruise/bleed easily.  SUBJECTIVE:  Pt denies chest pain, shortness of breath, abdominal pain, N/V, fever or chills Reports he doesn't know where he is He received 2 mg Ativan and 10 mg of Geodon on the MedSurg floor Requiring 6L [place]  VITAL SIGNS: Temp:  [98.5 F (36.9 C)-99.9 F (37.7 C)] 98.5 F (36.9 C) (06/16 2359) Pulse Rate:  [59-80] 80 (06/16 2359) Resp:  [20] 20 (06/16 2210) BP: (117-147)/(56-63) 136/63 (06/16 2359) SpO2:  [91 %-94  %] 94 % (06/16 2359)  PHYSICAL EXAMINATION: General: Acutely ill-appearing male, sitting in bed, on 6 L nasal cannula, with mild respiratory distress Neuro: Awake, alert, oriented to person and situation, follows commands, no focal deficits, pupils PERRLA HEENT: Atraumatic, normocephalic, neck supple, no JVD Cardiovascular: Regular rate and rhythm, S1-S2, no murmurs rubs or gallops Lungs: Clear to auscultation bilaterally, even, mild tachypnea, mild accessory muscle use Abdomen: Obese, soft, nontender, no guarding or rebound tenderness Musculoskeletal: Right BKA, normal bulk and tone, no edema Skin: Warm and dry, no obvious rashes lesions or ulcerations  Recent Labs  Lab 07/02/18 0421 07/03/18 0351 07/04/18 0258  NA 142 145 140  K 3.1* 4.0 3.3*  CL 104 108 104  CO2 29 30 29   BUN 35* 40* 41*  CREATININE 1.21 1.16 1.18  GLUCOSE 223* 263* 218*   Recent Labs  Lab 06/29/18 1952 06/30/18 0512  HGB 12.7* 12.6*  HCT 37.6* 37.8*  WBC 5.2 7.4  PLT 211 175   Ct Head Wo Contrast  Result Date: 07/04/2018 CLINICAL DATA:  73 y/o  M; increasing confusion. EXAM: CT HEAD WITHOUT CONTRAST TECHNIQUE: Contiguous axial images were obtained from the base of the skull through the vertex without intravenous contrast. COMPARISON:  12/09/2014 CT of the head. FINDINGS: Brain: No evidence of acute infarction, hemorrhage, hydrocephalus, extra-axial collection or mass lesion/mass effect. Stable chronic microvascular ischemic changes and volume loss of the brain. Vascular: Calcific atherosclerosis of carotid siphons. No hyperdense vessel. Skull: Normal. Negative for fracture or focal lesion. Sinuses/Orbits: No acute finding. Other: Bilateral intra-ocular lens replacement. IMPRESSION: 1. No acute intracranial abnormality identified. 2. Stable chronic microvascular ischemic changes and volume loss of the brain. Electronically Signed   By: Mitzi HansenLance  Furusawa-Stratton M.D.   On: 07/04/2018 23:24    ASSESSMENT /  PLAN:  Acute Delirium & Agitation, ? DT's (however pt's wife reports pt doesn't drink but 1-2 beers per month) -Provide supportive care -CT Head 6/16 negative -ABG with Hypoxia -CIWA Protocol -Continue Folic acid, Thiamine, and MV -Received Ativan and Geodon on MedSurg unit, currently calm -Precedex drip if needed -Obtain EEG -Obtain Urine Drug Screen & UA  Acute hypoxic respiratory failure in setting of pulmonary edema, bilateral pleural effusions, & Atelectasis -Supplemental O2 to maintain O2 sats greater than 92% -Follow intermittent chest x-ray and ABG as needed -Chest x-ray with atelectasis, pulmonary edema, and bilateral pleural effusions -Check BNP>>874 -Hold IVF for now -Will give 40 mg IV Lasix x1 dose -Incentive spirometry and flutter valve as able -Obtain Echocardiogram  Acute Pancreatitis Choledocholithiasis & Cholelithiasis -NPO -IVF on hold for now due to pulmonary edema -Trend LFT's -GI & General Surgery following, appreciate input -ERCP by Dr. Servando SnareWohl on 6/14 >> single segmental biliary stricture was found in lower third of main bile duct, of which sphincterotomy and stent was placed into common bile duct>>Recommends repeat ERCP in 2 months to remove stent  -MR Abdomen performed 6/16, results pending -Lap Chole as per General Surgery  AKI Hypokalemia -Monitor I&O's / urinary output -Follow BMP -Ensure adequate renal perfusion -Avoid nephrotoxic agents as able -Replace electrolytes as indicated  DM II w/ Hyperglycemia -CBG's -SSI -Follow ICU Hypo/hyperglycemia protocol        DISPOSITION: Stepdown GOALS OF CARE: Full code VTE PROPHYLAXIS:  Lovenox SQ UPDATES: Called and updated pt's wife Mark MuslimLora Bean 6/17 of transfer to ICU due to severe agitation and delirium requiring precedex, and that work-up so far has been negative.  Mark Bean, AGACNP-BC [place] [facility] Pager: [phone] Cell: [phone]  07/05/2018, 1:38  AM

## 2018-07-05 NOTE — Progress Notes (Signed)
Pt's daughter and wife called me with concerns about having the patient on the CIWA scale and treating him because "he doesn't drink". Pt's daughter stated "he may drink 1-2 beers per month". MD notified. Orders for ABG stat x1, head CT. VSS, BG-WDL. CT head negative. Pt is also very agitated. Orders for geodon IV x 1 given and effective for only a short time. Orders for ativan 2mg  IV x 1 with little improvement. Md notified. Orders to transfer pt to stepdown unit to start precedex drip. Wife Zacarias Pontes) notified.

## 2018-07-05 NOTE — Progress Notes (Signed)
*  PRELIMINARY RESULTS* Echocardiogram 2D Echocardiogram has been performed.  Mark Bean 07/05/2018, 9:16 AM

## 2018-07-05 NOTE — Progress Notes (Signed)
Arlyss Repress, MD [address]  [address]  [place], [place] [address]  Main: [phone]  Fax: [phone] Pager: [phone]   Subjective: Patient was transferred to ICU overnight due to delirium.  He had a CT head which did not reveal any acute changes.  Patient is started on Precedex drip and he also received Geodon.  He is more relaxed and oriented in the ICU.  He denies abdominal pain, nausea or vomiting.  He was tolerating diet well until yesterday.   Objective: Vital signs in last 24 hours: Vitals:   07/05/18 0600 07/05/18 0700 07/05/18 0800 07/05/18 0900  BP: 116/60 124/63 (!) 125/59 116/62  Pulse: (!) 52 (!) 53 (!) 56 (!) 52  Resp: (!) Temp:   98.2 F (36.8 C)   TempSrc:   Axillary   SpO2: 95% 94% 95% 95%  Weight:      Height:       Weight change:   Intake/Output Summary (Last 24 hours) at 07/05/2018 1313 Last data filed at 07/05/2018 0700 Gross per 24 hour  Intake 1400.38 ml  Output 1725 ml  Net -324.62 ml     Exam: Heart:: Regular rate and rhythm, S1S2 present or without murmur or extra heart sounds Lungs: normal and clear to auscultation Abdomen: soft, nontender, normal bowel sounds   Lab Results: CBC Latest Ref Rng & Units 07/05/2018 06/30/2018 06/29/2018  WBC 4.0 - 10.5 K/uL 8.3 7.4 5.2  Hemoglobin 13.0 - 17.0 g/dL 11.0(L) 12.6(L) 12.7(L)  Hematocrit 39.0 - 52.0 % 33.8(L) 37.8(L) 37.6(L)  Platelets 150 - 400 K/uL 185 175 211   CMP Latest Ref Rng & Units 07/05/2018 07/04/2018 07/03/2018  Glucose 70 - 99 mg/dL 403(K) 742(V) 956(L)  BUN 8 - 23 mg/dL 87(F) 64(P) 32(R)  Creatinine 0.61 - 1.24 mg/dL 5.18 8.41 6.60  Sodium 135 - 145 mmol/L 143 140 145  Potassium 3.5 - 5.1 mmol/L 3.3(L) 3.3(L) 4.0  Chloride 98 - 111 mmol/L 108 104 108  CO2 22 - 32 mmol/L Calcium 8.9 - 10.3 mg/dL 7.6(L) 7.5(L) 7.8(L)  Total Protein 6.5 - 8.1 g/dL 6.3(K) - 5.7(L)  Total Bilirubin 0.3 - 1.2 mg/dL 1.6(W) - 1.1  Alkaline Phos 38 - 126 U/L 77  - 62  AST 15 - 41 U/L 30 - 28  ALT 0 - 44 U/L 49(H) - 85(H)    Micro Results: Recent Results (from the past 240 hour(s))  Novel Coronavirus,NAA,(SEND-OUT TO REF LAB - TAT 24-48 hrs); Hosp Order     Status: None   Collection Time: 06/30/18  1:00 AM   Specimen: Nasopharyngeal Swab; Respiratory  Result Value Ref Range Status   SARS-CoV-2, NAA NOT DETECTED NOT DETECTED Final    Comment: (NOTE) This test was developed and its performance characteristics determined by [employer]. This test has not been FDA cleared or approved. This test has been authorized by FDA under an Emergency Use Authorization (EUA). This test is only authorized for the duration of time the declaration that circumstances exist justifying the authorization of the emergency use of in vitro diagnostic tests for detection of SARS-CoV-2 virus and/or diagnosis of COVID-19 infection under section 564(b)(1) of the Act, 21 U.S.C. 109NAT-5(T)(7), unless the authorization is terminated or revoked sooner. When diagnostic testing is negative, the possibility of a false negative result should be considered in the context of a patient's recent exposures and the presence of clinical signs and symptoms consistent with COVID-19. An  individual without symptoms of COVID-19 and who is not shedding SARS-CoV-2 virus would expect to have a negative (not detected) result in this assay. Performed  At: [facility] [address] [place], [place] [id number] Rush Farmer MD AS:[id number]    [place]  Final    Comment: Performed at [facility], [address] [place], [place] [address]  SARS Coronavirus 2 [facility] order, Performed in [place] hospital lab)     Status: None   Collection Time: 06/30/18  2:49 PM   Specimen: Nasopharyngeal Swab  Result Value Ref Range Status   SARS Coronavirus 2 NEGATIVE NEGATIVE Final    Comment: (NOTE) If result is NEGATIVE SARS-CoV-2 target  nucleic acids are NOT DETECTED. The SARS-CoV-2 RNA is generally detectable in upper and lower  respiratory specimens during the acute phase of infection. The lowest  concentration of SARS-CoV-2 viral copies this assay can detect is 250  copies / mL. A negative result does not preclude SARS-CoV-2 infection  and should not be used as the sole basis for treatment or other  patient management decisions.  A negative result may occur with  improper specimen collection / handling, submission of specimen other  than nasopharyngeal swab, presence of viral mutation(s) within the  areas targeted by this assay, and inadequate number of viral copies  (<250 copies / mL). A negative result must be combined with clinical  observations, patient history, and epidemiological information. If result is POSITIVE SARS-CoV-2 target nucleic acids are DETECTED. The SARS-CoV-2 RNA is generally detectable in upper and lower  respiratory specimens dur ing the acute phase of infection.  Positive  results are indicative of active infection with SARS-CoV-2.  Clinical  correlation with patient history and other diagnostic information is  necessary to determine patient infection status.  Positive results do  not rule out bacterial infection or co-infection with other viruses. If result is PRESUMPTIVE POSTIVE SARS-CoV-2 nucleic acids MAY BE PRESENT.   A presumptive positive result was obtained on the submitted specimen  and confirmed on repeat testing.  While 2019 novel coronavirus  (SARS-CoV-2) nucleic acids may be present in the submitted sample  additional confirmatory testing may be necessary for epidemiological  and / or clinical management purposes  to differentiate between  SARS-CoV-2 and other Sarbecovirus currently known to infect humans.  If clinically indicated additional testing with an alternate test  methodology [phone]) is advised. The SARS-CoV-2 RNA is generally  detectable in upper and lower  respiratory sp ecimens during the acute  phase of infection. The expected result is Negative. Fact Sheet for Patients:  StrictlyIdeas.no Fact Sheet for Healthcare Providers: BankingDealers.co.za This test is not yet approved or cleared by the [place] FDA and has been authorized for detection and/or diagnosis of SARS-CoV-2 by FDA under an Emergency Use Authorization (EUA).  This EUA will remain in effect (meaning this test can be used) for the duration of the COVID-19 declaration under Section 564(b)(1) of the Act, 21 U.S.C. section 360bbb-3(b)(1), unless the authorization is terminated or revoked sooner. Performed at [facility], [address] [place], [place] [address]   Culture, blood (routine x 2)     Status: None (Preliminary result)   Collection Time: 07/01/18  1:25 PM   Specimen: BLOOD  Result Value Ref Range Status   Specimen Description BLOOD L ARM  Final   Special Requests   Final    BOTTLES DRAWN AEROBIC AND ANAEROBIC Blood Culture results may not be optimal due to an  excessive volume of blood received in culture bottles   Culture   Final    NO GROWTH 4 DAYS Performed at [facility], [address] [place], [place] [address]    Report Status PENDING  Incomplete  Culture, blood (routine x 2)     Status: None (Preliminary result)   Collection Time: 07/01/18  2:38 PM   Specimen: BLOOD  Result Value Ref Range Status   Specimen Description BLOOD BLOOD LEFT ARM  Final   Special Requests   Final    BOTTLES DRAWN AEROBIC AND ANAEROBIC Blood Culture results may not be optimal due to an excessive volume of blood received in culture bottles   Culture   Final    NO GROWTH 4 DAYS Performed at [facility], [address] [place], [place] [address]    Report Status PENDING  Incomplete   Studies/Results: Ct Head Wo Contrast  Result Date: 07/04/2018 CLINICAL DATA:  73 y/o  M; increasing  confusion. EXAM: CT HEAD WITHOUT CONTRAST TECHNIQUE: Contiguous axial images were obtained from the base of the skull through the vertex without intravenous contrast. COMPARISON:  12/09/2014 CT of the head. FINDINGS: Brain: No evidence of acute infarction, hemorrhage, hydrocephalus, extra-axial collection or mass lesion/mass effect. Stable chronic microvascular ischemic changes and volume loss of the brain. Vascular: Calcific atherosclerosis of carotid siphons. No hyperdense vessel. Skull: Normal. Negative for fracture or focal lesion. Sinuses/Orbits: No acute finding. Other: Bilateral intra-ocular lens replacement. IMPRESSION: 1. No acute intracranial abnormality identified. 2. Stable chronic microvascular ischemic changes and volume loss of the brain. Electronically Signed   By: Mitzi HansenLance  Furusawa-Stratton M.D.   On: 07/04/2018 23:24   Dg Chest Port 1 View  Result Date: 07/05/2018 CLINICAL DATA:  Hypoxia. EXAM: PORTABLE CHEST 1 VIEW COMPARISON:  Chest radiograph 06/29/2018 FINDINGS: Lower lung volumes from prior exam. Post median sternotomy. Borderline cardiomegaly. Bilateral pleural effusions and compressive atelectasis. Vascular congestion. No pneumothorax. Chronic changes of both shoulders. IMPRESSION: Lower lung volumes from prior exam with vascular congestion. Bilateral pleural effusions and compressive atelectasis. Findings suggest fluid overload. Electronically Signed   By: Narda RutherfordMelanie  Sanford M.D.   On: 07/05/2018 02:44   Mr Abdomen Mrcp Vivien RossettiW Wo Contast  Result Date: 07/05/2018 CLINICAL DATA:  Abdominal pain radiating to the back with nausea and vomiting, acute pancreatitis. EXAM: MRI ABDOMEN WITHOUT AND WITH CONTRAST (INCLUDING MRCP) TECHNIQUE: Multiplanar multisequence MR imaging of the abdomen was performed both before and after the administration of intravenous contrast. Heavily T2-weighted images of the biliary and pancreatic ducts were obtained, and three-dimensional MRCP images were rendered by post  processing. CONTRAST:  9 cc Gadavist COMPARISON:  Multiple exams, including 06/29/2018 CT and 06/30/2018 ultrasound FINDINGS: Despite efforts by the technologist and patient, motion artifact is present on today's exam and could not be eliminated. This reduces exam sensitivity and specificity. Lower chest: Small to moderate bilateral pleural effusions with associated passive atelectasis. Prior median sternotomy. Hepatobiliary: Currently no biliary dilatation. Tiny gallstone in the gallbladder. 1.5 by 1.4 cm dense calcified extracapsular lesion along the posterior capsular margin of the right hepatic lobe on image 20/3, not enhancing, etiology uncertain. No significant focal liver lesion identified. No definite choledocholithiasis is confirmed on today's study. Pancreas: Moderate pancreatic and peripancreatic edema compatible with acute pancreatitis. No pseudocyst, abscess, or findings of pancreatic necrosis. Spleen:  Unremarkable Adrenals/Urinary Tract: Very small T2 hyperintense not appreciably enhancing lesions in both kidneys are likely tiny cysts. The adrenal glands appear normal. Stomach/Bowel: Unremarkable Vascular/Lymphatic: Aortoiliac atherosclerotic  vascular disease. Patent portal vein and splenic vein. No appreciable pathologic adenopathy. Other:  No supplemental non-categorized findings. Musculoskeletal: Unremarkable IMPRESSION: 1. Moderate pancreatic peripancreatic edema compatible with acute pancreatitis. No pancreatic pseudocyst, abscess, or pancreatic necrosis identified at this time. 2. Small to moderate bilateral pleural effusions with passive atelectasis. 3. Tiny gallstone in the gallbladder. No current biliary dilatation. 4. 1.5 cm densely calcified lesion along the posterior capsular margin of the right hepatic lobe. This is been present at least since 2015 and could be a calcified exophytic cyst or focus of calcified fat necrosis. 5.  Aortic Atherosclerosis (ICD10-I70.0). 6. Despite efforts by  the technologist and patient, motion artifact is present on today's exam and could not be eliminated. This reduces exam sensitivity and specificity. Electronically Signed   By: Gaylyn RongWalter  Liebkemann M.D.   On: 07/05/2018 08:20   Medications:  I have reviewed the patient's current medications. Prior to Admission:  Medications Prior to Admission  Medication Sig Dispense Refill Last Dose  . amLODipine (NORVASC) 10 MG tablet Take 5 mg by mouth daily.    06/29/2018 at Unknown time  . aspirin EC 81 MG tablet Take 81 mg by mouth at bedtime.    06/28/2018 at Unknown time  . atorvastatin (LIPITOR) 80 MG tablet Take 80 mg by mouth daily.   06/28/2018 at Unknown time  . carvedilol (COREG) 12.5 MG tablet Take 12.5 mg by mouth 2 (two) times daily with a meal.   06/29/2018 at am  . chlorthalidone (HYGROTON) 50 MG tablet Take 50 mg by mouth daily.    06/29/2018 at Unknown time  . cholecalciferol (VITAMIN D) 1000 units tablet Take 2,000 Units by mouth daily.   06/29/2018 at Unknown time  . citalopram (CELEXA) 40 MG tablet Take 20 mg by mouth daily.   06/29/2018 at Unknown time  . cyanocobalamin 1000 MCG tablet Take 1,000 mcg by mouth daily.    06/29/2018 at Unknown time  . gabapentin (NEURONTIN) 100 MG capsule TAKE 1 CAPSULE BY MOUTH THREE TIMES A DAY (Patient taking differently: Take 100-200 mg by mouth 2 (two) times daily. 100mg  in the morning and 200mg  at bedtime) 90 capsule 3 06/29/2018 at am  . insulin aspart (NOVOLOG) 100 unit/mL injection Inject 20 Units into the skin 2 (two) times a day.    06/29/2018 at am  . insulin glargine (LANTUS) 100 UNIT/ML injection Inject 28 Units into the skin at bedtime.    06/28/2018 at Unknown time  . liraglutide (VICTOZA) 18 MG/3ML SOPN Inject 1.2 mg into the skin at bedtime.   06/28/2018 at Unknown time  . lisinopril (PRINIVIL,ZESTRIL) 40 MG tablet Take 40 mg by mouth daily.   06/29/2018 at Unknown time  . Melatonin 10 MG TABS Take 10 mg by mouth at bedtime.    06/28/2018 at Unknown time   . Omega-3 Fatty Acids (FISH OIL) 1200 MG CAPS Take 1,200 mg by mouth daily.    06/29/2018 at Unknown time   Scheduled: . amLODipine  5 mg Oral Daily  . carvedilol  12.5 mg Oral BID WC  . citalopram  20 mg Oral Daily  . enoxaparin (LOVENOX) injection  40 mg Subcutaneous Q24H  . folic acid  1 mg Oral Daily  . insulin aspart  0-15 Units Subcutaneous TID WC  . insulin aspart  0-5 Units Subcutaneous QHS  . insulin glargine  20 Units Subcutaneous QHS  . iohexol  50 mL Oral Once  . lisinopril  40 mg Oral Daily  . multivitamin with  minerals  1 tablet Oral Daily  . thiamine  100 mg Oral Daily   Or  . thiamine  100 mg Intravenous Daily   Continuous: . dexmedetomidine (PRECEDEX) IV infusion Stopped (07/05/18 1000)  . banana bag IV 1000 mL     ZOX:[id number]:acetaminophen **OR** acetaminophen, hydrALAZINE, ondansetron **OR** ondansetron (ZOFRAN) IV Anti-infectives (From admission, onward)   None     Scheduled Meds: . amLODipine  5 mg Oral Daily  . carvedilol  12.5 mg Oral BID WC  . citalopram  20 mg Oral Daily  . enoxaparin (LOVENOX) injection  40 mg Subcutaneous Q24H  . folic acid  1 mg Oral Daily  . insulin aspart  0-15 Units Subcutaneous TID WC  . insulin aspart  0-5 Units Subcutaneous QHS  . insulin glargine  20 Units Subcutaneous QHS  . iohexol  50 mL Oral Once  . lisinopril  40 mg Oral Daily  . multivitamin with minerals  1 tablet Oral Daily  . thiamine  100 mg Oral Daily   Or  . thiamine  100 mg Intravenous Daily   Continuous Infusions: . dexmedetomidine (PRECEDEX) IV infusion Stopped (07/05/18 1000)  . banana bag IV 1000 mL     PRN Meds:.acetaminophen **OR** acetaminophen, hydrALAZINE, ondansetron **OR** ondansetron (ZOFRAN) IV   Assessment: Principal Problem:   Pancreatitis Active Problems:   Choledocholithiasis   OSA (obstructive sleep apnea)   HTN (hypertension)   CAD (coronary artery disease)   Diabetes (HCC)   AKI (acute kidney injury) (HCC)   Elevated liver enzymes    Stenosis of bile duct  Delirium thought to be secondary to night terrors from obstructive sleep apnea.  Patient does not have history of alcohol use.  His acute pancreatitis is improving  Plan: Advance diet as tolerated Encourage p.o. fluid intake Cytology of the common bile duct stricture results are pending MRI/MRCP did not reveal any focal pancreatic lesions other than moderate peripancreatic edema secondary to acute pancreatitis Timing of cholecystectomy per general surgery Will need follow-up with Dr. Servando SnareWohl for CBD stent exchange/removal in 2 months  GI will sign off at this time, please call us back with questions or concerns    LOS: 5 days   Juni Glaab 07/05/2018, 1:13 PM

## 2018-07-05 NOTE — Progress Notes (Signed)
Pt now awake, alert &  oriented to person. Easily reoriented to place, time, situation. Pt follows simple commands. Answer questions appropriately. Wife called and updated. Pt also spoke with wife on telephone.

## 2018-07-05 NOTE — Progress Notes (Signed)
Pharmacy Electrolyte Monitoring Consult:  Pharmacy consulted to assist in monitoring and replacing electrolytes in this 73 y.o. male admitted on 06/29/2018. Patient admitted with acute pancreatitis and underwent ERCP on 6/14. Patient admitted to ICU early on 6/17 after experiencing acute delirium on the floor. Patient is now mentating and has diet order.   Labs:  Sodium (mmol/L)  Date Value  07/05/2018 143  06/09/2013 142   Potassium (mmol/L)  Date Value  07/05/2018 3.3 (L)  06/09/2013 3.4 (L)   Magnesium (mg/dL)  Date Value  07/05/2018 2.1   Phosphorus (mg/dL)  Date Value  07/05/2018 1.5 (L)   Calcium (mg/dL)  Date Value  07/05/2018 7.6 (L)   Calcium, Total (mg/dL)  Date Value  06/09/2013 8.5   Albumin (g/dL)  Date Value  07/05/2018 2.9 (L)  06/09/2013 3.5    Assessment/Plan: Patient received potassium 54mEq IV Q1hr x 4 doses.   Will order potassium phosphate 35mmol IV x 1.   Will obtain follow up electrolytes with am labs.   Will replace for goal potassium ~ 4, goal magnesium ~ 2, and goal phosphorus ~2.5.   Pharmacy will continue to monitor and adjust per consult.     Simpson,Michael L 07/05/2018 2:38 PM

## 2018-07-06 LAB — C DIFFICILE QUICK SCREEN W PCR REFLEX
C Diff antigen: POSITIVE — AB
C Diff interpretation: DETECTED
C Diff toxin: POSITIVE — AB

## 2018-07-06 LAB — CULTURE, BLOOD (ROUTINE X 2)
Culture: NO GROWTH
Culture: NO GROWTH

## 2018-07-06 LAB — CBC WITH DIFFERENTIAL/PLATELET
Abs Immature Granulocytes: 0.08 10*3/uL — ABNORMAL HIGH (ref 0.00–0.07)
Basophils Absolute: 0 10*3/uL (ref 0.0–0.1)
Basophils Relative: 0 %
Eosinophils Absolute: 0.1 10*3/uL (ref 0.0–0.5)
Eosinophils Relative: 2 %
HCT: 29.8 % — ABNORMAL LOW (ref 39.0–52.0)
Hemoglobin: 9.7 g/dL — ABNORMAL LOW (ref 13.0–17.0)
Immature Granulocytes: 1 %
Lymphocytes Relative: 11 %
Lymphs Abs: 0.9 10*3/uL (ref 0.7–4.0)
MCH: 28.5 pg (ref 26.0–34.0)
MCHC: 32.6 g/dL (ref 30.0–36.0)
MCV: 87.6 fL (ref 80.0–100.0)
Monocytes Absolute: 0.5 10*3/uL (ref 0.1–1.0)
Monocytes Relative: 7 %
Neutro Abs: 6.3 10*3/uL (ref 1.7–7.7)
Neutrophils Relative %: 79 %
Platelets: 194 10*3/uL (ref 150–400)
RBC: 3.4 MIL/uL — ABNORMAL LOW (ref 4.22–5.81)
RDW: 13 % (ref 11.5–15.5)
WBC: 8 10*3/uL (ref 4.0–10.5)
nRBC: 0 % (ref 0.0–0.2)

## 2018-07-06 LAB — BASIC METABOLIC PANEL
Anion gap: 8 (ref 5–15)
BUN: 26 mg/dL — ABNORMAL HIGH (ref 8–23)
CO2: 25 mmol/L (ref 22–32)
Calcium: 7.4 mg/dL — ABNORMAL LOW (ref 8.9–10.3)
Chloride: 107 mmol/L (ref 98–111)
Creatinine, Ser: 1.05 mg/dL (ref 0.61–1.24)
GFR calc Af Amer: >60 mL/min (ref >60)
GFR calc non Af Amer: >60 mL/min (ref >60)
Glucose, Bld: 201 mg/dL — ABNORMAL HIGH (ref 70–99)
Potassium: 3.7 mmol/L (ref 3.5–5.1)
Sodium: 140 mmol/L (ref 135–145)

## 2018-07-06 LAB — GLUCOSE, CAPILLARY
Glucose-Capillary: 180 mg/dL — ABNORMAL HIGH (ref 70–99)
Glucose-Capillary: 294 mg/dL — ABNORMAL HIGH (ref 70–99)
Glucose-Capillary: 215 mg/dL — ABNORMAL HIGH (ref 70–99)
Glucose-Capillary: 256 mg/dL — ABNORMAL HIGH (ref 70–99)

## 2018-07-06 LAB — MAGNESIUM: Magnesium: 2 mg/dL (ref 1.7–2.4)

## 2018-07-06 LAB — PHOSPHORUS: Phosphorus: 2.2 mg/dL — ABNORMAL LOW (ref 2.5–4.6)

## 2018-07-06 MED ORDER — TAMSULOSIN HCL 0.4 MG PO CAPS
0.4000 mg | ORAL_CAPSULE | Freq: Every day | ORAL | Status: DC
  Administered 2018-07-06 – 2018-07-07 (×2): 0.4 mg via ORAL
  Filled 2018-07-06 (×2): qty 1

## 2018-07-06 MED ORDER — POTASSIUM PHOSPHATE MONOBASIC 500 MG PO TABS
500.0000 mg | ORAL_TABLET | Freq: Three times a day (TID) | ORAL | Status: DC
  Filled 2018-07-06: qty 1

## 2018-07-06 MED ORDER — POTASSIUM PHOSPHATE MONOBASIC 500 MG PO TABS
500.0000 mg | ORAL_TABLET | Freq: Three times a day (TID) | ORAL | Status: AC
  Administered 2018-07-06 (×2): 500 mg via ORAL
  Filled 2018-07-06 (×4): qty 1

## 2018-07-06 MED ORDER — POTASSIUM CHLORIDE CRYS ER 20 MEQ PO TBCR
40.0000 meq | EXTENDED_RELEASE_TABLET | Freq: Once | ORAL | Status: DC
  Filled 2018-07-06: qty 2

## 2018-07-06 MED ORDER — MELATONIN 5 MG PO TABS
10.0000 mg | ORAL_TABLET | Freq: Every day | ORAL | Status: DC
  Administered 2018-07-06 (×2): 10 mg via ORAL
  Filled 2018-07-06 (×4): qty 2

## 2018-07-06 MED ORDER — VANCOMYCIN 50 MG/ML ORAL SOLUTION
125.0000 mg | Freq: Four times a day (QID) | ORAL | Status: DC
  Administered 2018-07-06 – 2018-07-07 (×4): 125 mg via ORAL
  Filled 2018-07-06 (×7): qty 2.5

## 2018-07-06 MED ORDER — FAMOTIDINE 20 MG PO TABS
20.0000 mg | ORAL_TABLET | Freq: Two times a day (BID) | ORAL | Status: DC
  Administered 2018-07-06 – 2018-07-07 (×3): 20 mg via ORAL
  Filled 2018-07-06 (×3): qty 1

## 2018-07-06 NOTE — Progress Notes (Signed)
After further assessment and discussion of patient's  status and medical condition during multidisciplinary rounds, the plan is outlined as below:  1.  Patient has loose stools.  Ordered sample for enteric pathogens-C. Difficile.  2.  Foley was inserted due to urinary retention.  Recommended start Flomax a day and voiding trial tomorrow.  3.  No further issues with disorientation.  Not requiring sedatives.  Transfer to regular floor.   Renold Don, MD [facility]

## 2018-07-06 NOTE — Progress Notes (Signed)
Subjective:  CC: Mark Bean is a 73 y.o. male  Hospital stay day 6, 4 Days Post-Op ERCP for acute pancreatitis.  HPI: No specific complaints today.  No pain.  Cannot recall why he became disoriented  ROS:  General: Denies weight loss, weight gain, fatigue, fevers, chills, and night sweats. Heart: Denies chest pain, palpitations, racing heart, irregular heartbeat, leg pain or swelling, and decreased activity tolerance. Respiratory: Denies breathing difficulty, shortness of breath, wheezing, cough, and sputum. GI: Denies change in appetite, heartburn, nausea, vomiting, constipation, diarrhea, and blood in stool. GU: Denies difficulty urinating, pain with urinating, urgency, frequency, blood in urine.   Objective:   Temp:  [98 F (36.7 C)-99 F (37.2 C)] 98.4 F (36.9 C) (06/18 1552) Pulse Rate:  [58-76] 62 (06/18 1552) Resp:  [12-33] 16 (06/18 1552) BP: (91-146)/(44-94) 117/64 (06/18 1552) SpO2:  [87 %-96 %] 95 % (06/18 1552) Weight:  [100.8 kg] 100.8 kg (06/18 1210)     Height: 5\' 8"  (172.7 cm) Weight: 100.8 kg BMI (Calculated): 33.79   Intake/Output this shift:   Intake/Output Summary (Last 24 hours) at 07/06/2018 1918 Last data filed at 07/06/2018 0200 Gross per 24 hour  Intake -  Output 1200 ml  Net -1200 ml    Constitutional :  alert, cooperative, appears stated age and no distress  Respiratory:  clear to auscultation bilaterally  Cardiovascular:  regular rate and rhythm  Gastrointestinal: soft, non-tender; bowel sounds normal; no masses,  no organomegaly.   Skin: Cool and moist.   Psychiatric: Normal affect, non-agitated, not confused       LABS:  CMP Latest Ref Rng & Units 07/06/2018 07/05/2018 07/04/2018  Glucose 70 - 99 mg/dL 201(H) 116(H) 218(H)  BUN 8 - 23 mg/dL 26(H) 28(H) 41(H)  Creatinine 0.61 - 1.24 mg/dL 1.05 1.03 1.18  Sodium 135 - 145 mmol/L 140 143 140  Potassium 3.5 - 5.1 mmol/L 3.7 3.3(L) 3.3(L)  Chloride 98 - 111 mmol/L 107 108 104  CO2 22 - 32  mmol/L 25 23 29   Calcium 8.9 - 10.3 mg/dL 7.4(L) 7.6(L) 7.5(L)  Total Protein 6.5 - 8.1 g/dL - 5.8(L) -  Total Bilirubin 0.3 - 1.2 mg/dL - 1.3(H) -  Alkaline Phos 38 - 126 U/L - 77 -  AST 15 - 41 U/L - 30 -  ALT 0 - 44 U/L - 49(H) -   CBC Latest Ref Rng & Units 07/06/2018 07/05/2018 06/30/2018  WBC 4.0 - 10.5 K/uL 8.0 8.3 7.4  Hemoglobin 13.0 - 17.0 g/dL 9.7(L) 11.0(L) 12.6(L)  Hematocrit 39.0 - 52.0 % 29.8(L) 33.8(L) 37.8(L)  Platelets 150 - 400 K/uL 194 185 175    RADS: n/a Assessment:   Status post ERCP for acute pancreatitis.  No evidence of choledocholithiasis on the procedure today.  However, there was noted to be a strictured area within the duct.  asymptomatic  Now C. Diff positive. MRI negative for extrinsic mass.  Cytology from ERCP brushing still pending for some reason.  If cytology is negative, can consider lap chole as an outpt after c.diff treatment is completed.  Will follow peripherally while pt remains in house.  Further care per medicine team.

## 2018-07-06 NOTE — Progress Notes (Signed)
Report given to [place], RN on 2A. Patient updated on plan of care. Per Dr. Duwayne Heck patient needs to keep foley in until tomorrow and give flomax dose today. RN updated on this during transfer. Lupita Leash

## 2018-07-06 NOTE — Progress Notes (Signed)
CRITICAL VALUE ALERT  Critical Value:  C-diff is positive  Date & Time Notied:  07/06/2018 at 24  Provider Notified: Dr. Darvin Neighbours  Orders Received/Actions taken: Keep pt on isolation and MD to add meds.

## 2018-07-06 NOTE — Progress Notes (Signed)
Pharmacy Electrolyte Monitoring Consult:  Pharmacy consulted to assist in monitoring and replacing electrolytes in this 73 y.o. male admitted on 06/29/2018. Patient admitted with acute pancreatitis and underwent ERCP on 6/14. Patient admitted to ICU early on 6/17 after experiencing acute delirium on the floor. Patient is now mentating and has diet order.   Labs:  Sodium (mmol/L)  Date Value  07/06/2018 140  06/09/2013 142   Potassium (mmol/L)  Date Value  07/06/2018 3.7  06/09/2013 3.4 (L)   Magnesium (mg/dL)  Date Value  07/06/2018 2.0   Phosphorus (mg/dL)  Date Value  07/06/2018 2.2 (L)   Calcium (mg/dL)  Date Value  07/06/2018 7.4 (L)   Calcium, Total (mg/dL)  Date Value  06/09/2013 8.5   Albumin (g/dL)  Date Value  07/05/2018 2.9 (L)  06/09/2013 3.5   Corrected Calcium: 8.3  Assessment/Plan: Potassium phosphate 1 tab with meals x 3 doses.   Will obtain follow up electrolytes with am labs.   Will replace for goal potassium ~ 4, goal magnesium ~ 2, and goal phosphorus ~2.5.   Pharmacy will continue to monitor and adjust per consult.     Priyana Mccarey L 07/06/2018 7:56 AM

## 2018-07-07 LAB — PHOSPHORUS: Phosphorus: 2.8 mg/dL (ref 2.5–4.6)

## 2018-07-07 LAB — BASIC METABOLIC PANEL
Anion gap: 6 (ref 5–15)
BUN: 22 mg/dL (ref 8–23)
CO2: 26 mmol/L (ref 22–32)
Calcium: 7.8 mg/dL — ABNORMAL LOW (ref 8.9–10.3)
Chloride: 105 mmol/L (ref 98–111)
Creatinine, Ser: 1.17 mg/dL (ref 0.61–1.24)
GFR calc Af Amer: >60 mL/min (ref >60)
GFR calc non Af Amer: >60 mL/min (ref >60)
Glucose, Bld: 325 mg/dL — ABNORMAL HIGH (ref 70–99)
Potassium: 3.4 mmol/L — ABNORMAL LOW (ref 3.5–5.1)
Sodium: 137 mmol/L (ref 135–145)

## 2018-07-07 LAB — GLUCOSE, CAPILLARY
Glucose-Capillary: 279 mg/dL — ABNORMAL HIGH (ref 70–99)
Glucose-Capillary: 357 mg/dL — ABNORMAL HIGH (ref 70–99)
Glucose-Capillary: 342 mg/dL — ABNORMAL HIGH (ref 70–99)

## 2018-07-07 LAB — MAGNESIUM: Magnesium: 2 mg/dL (ref 1.7–2.4)

## 2018-07-07 MED ORDER — VANCOMYCIN HCL 125 MG PO CAPS: 125.0000 mg | ORAL_CAPSULE | Freq: Four times a day (QID) | ORAL | 0 refills | Status: AC

## 2018-07-07 MED ORDER — POTASSIUM CHLORIDE CRYS ER 20 MEQ PO TBCR
40.0000 meq | EXTENDED_RELEASE_TABLET | Freq: Once | ORAL | Status: AC
  Administered 2018-07-07: 40 meq via ORAL
  Filled 2018-07-07: qty 2

## 2018-07-07 MED ORDER — SACCHAROMYCES BOULARDII 250 MG PO CAPS: 250.0000 mg | ORAL_CAPSULE | Freq: Two times a day (BID) | ORAL | 0 refills | Status: AC

## 2018-07-07 NOTE — Discharge Instructions (Signed)
Resume diet and activity as before. ° °- to minimize other family members from exposure to cdifficile spores °  °- recommend to use soap and water for hand hygiene, dilligent hand hygiene for all family members/household members °- recommend to use bleach wipes/household bleach to scrub/wipe down bathroom fixtures ( toilet seat, sink handles, door handles) daily °- wear disposable gloves when cleaning; wash hands with soap and water afterwards °- can also see patient education material - at [employer]org/patient-support. ° °

## 2018-07-07 NOTE — Progress Notes (Signed)
PT Cancellation Note  Patient Details Name: Mark Bean MRN: [id number] DOB: 23-Dec-1945   Cancelled Treatment:    Reason Eval/Treat Not Completed: Patient at procedure or test/unavailable;Other (comment)(Patient consult recieved and reviewed. Was not in room upon evaluation attempt. Will attempt again at later time/date.)  Janna Arch, PT, DPT   07/07/2018, 1:08 PM

## 2018-07-07 NOTE — Progress Notes (Signed)
[facility] -  at [facility]   PATIENT NAME: Mark KaiserRoger Sica    MR#:  [id number]  DATE OF BIRTH:  12/28/1945  SUBJECTIVE:   And transferred to medical floor. Watery diarrhea  REVIEW OF SYSTEMS:   Review of Systems  Unable to perform ROS: Mental status change   DRUG ALLERGIES:  No Known Allergies  VITALS:  Blood pressure 137/65, pulse 63, temperature 98.7 F (37.1 C), temperature source Oral, resp. rate 16, height 5\' 8"  (1.727 m), weight 100.8 kg, SpO2 97 %.  PHYSICAL EXAMINATION:   Physical Exam  GENERAL:  73 y.o.-year-old patient lying in the bed. Obese. EYES: Pupils equal, round, reactive to light and accommodation. No scleral icterus. Extraocular muscles intact.  HEENT: Head atraumatic, normocephalic. NECK:  Supple, no jugular venous distention. No thyroid enlargement, no tenderness.  LUNGS: Normal breath sounds bilaterally, no wheezing, rales, rhonchi. No use of accessory muscles of respiration.  CARDIOVASCULAR: S1, S2 normal. No murmurs, rubs, or gallops.  ABDOMEN: Soft, no tenderness, nondistended. Bowel sounds present. No organomegaly or mass.  EXTREMITIES: No cyanosis, clubbing or edema b/l.   Right BKA  NEUROLOGIC: Not following instructions PSYCHIATRIC:  patient is alert and oriented SKIN: No obvious rash, lesion, or ulcer.   LABORATORY PANEL:  CBC Recent Labs  Lab 07/06/18 0239  WBC 8.0  HGB 9.7*  HCT 29.8*  PLT 194    Chemistries  Recent Labs  Lab 07/05/18 0332  07/07/18 0458  NA 143   < > 137  K 3.3*   < > 3.4*  CL 108   < > 105  CO2 23   < > 26  GLUCOSE 116*   < > 325*  BUN 28*   < > 22  CREATININE 1.03   < > 1.17  CALCIUM 7.6*   < > 7.8*  MG 2.1   < > 2.0  AST 30  --   --   ALT 49*  --   --   ALKPHOS 77  --   --   BILITOT 1.3*  --   --    < > = values in this interval not displayed.   Cardiac Enzymes No results for input(s): TROPONINI in the last 168 hours. RADIOLOGY:  No results found. ASSESSMENT  AND PLAN:   Mark Bean  is a 73 y.o. male who presents with anxiety, arthritis, CAD, diabetes  Presents the ED with acute onset of severe abdominal pain rating to his back.  This started earlier today.  He has significant associated nausea with vomiting.  On evaluation here in the ED is found to have a lipase of 4000, with Choledocholithiasis seen on CT imaging  *C. difficile infection Watery diarrhea and C. difficile check.  Positive.  Will start oral vancomycin and probiotics. Isolation ordered  1.  Acute pancreatitis suspected gallstone induced with  choledocholithiasis Continue IV fluids, PO with ice chips and meds -lipase 4000--- [id number]. -increasing LFTs. Hold statins -IV morphine PRN ERCP by Dr. Servando SnareWohl: A single segmental biliary stricture was found in the                        lower third of the main bile duct.                       - A biliary sphincterotomy was performed.                       -  Cells for cytology obtained in the lower third of the                        main duct.                       - One plastic stent was placed into the common bile                        duct. Repeat ERCP in 2 months to remove stent per Dr. Allen Norris. Liver function tests is improving. He tolerated full liquid diet.  MRCP and MRI of abdomen showed no mass.  2. cholelithiasis  Aspirin on hold for eventual lap chole per Dr. Lysle Pearl. Cholecystectomy as outpatient due to acute C. difficile infection  ARF due to dehydration.  Resolved  Hypokalemia. Potassium supplementation Hypomagnesemia.  Improved with IV magnesium.  3. Type II diabetes, hyperglycemia, increased Lantus to 20 unit HS, change to moderate sliding scale insulin  4. Hypertension continue home meds  5. CAD continue home meds  6.  Acute metabolic encephalopathy.  Etiology unclear.   Likely inpatient delirium and acute illness.  CT head normal.  Ammonia normal.  Rarely drinks alcohol.  CODE STATUS: Full  DVT  Prophylaxis: scd-- pt pending GB surgery.  TOTAL TIME TAKING CARE OF THIS PATIENT: 30 minutes.   Note: This dictation was prepared with Dragon dictation along with smaller phrase technology. Any transcriptional errors that result from this process are unintentional.  Neita Carp M.D on 07/07/2018 at 1:45 PM  Between 7am to 6pm - Pager - [phone]  After 6pm go to [email or web address] - password [email or web address] [facility]  Office  [phone]  CC: Primary care physician; [facility] DAUNE COLGATE, male   DOB: 10/31/1945, 73 y.o.   MRN: [id number]

## 2018-07-07 NOTE — Plan of Care (Signed)
?  Problem: Clinical Measurements: ?Goal: Will remain free from infection ?Outcome: Progressing ?Goal: Diagnostic test results will improve ?Outcome: Progressing ?Goal: Respiratory complications will improve ?Outcome: Progressing ?  ?

## 2018-07-07 NOTE — Progress Notes (Addendum)
Inpatient Diabetes Program Recommendations  AACE/ADA: New Consensus Statement on Inpatient Glycemic Control (2015)  Target Ranges:  Prepandial:   less than 140 mg/dL      Peak postprandial:   less than 180 mg/dL (1-2 hours)      Critically ill patients:  140 - 180 mg/dL   Results for Mark Bean, Mark Bean (MRN [id number]) as of 07/07/2018 07:51  Ref. Range 07/06/2018 07:42 07/06/2018 11:22 07/06/2018 16:40 07/06/2018 20:49 07/06/2018 22:32  Glucose-Capillary Latest Ref Range: 70 - 99 mg/dL 180 (H)  3 units NOVOLOG  294 (H)  8 units NOVOLOG  215 (H)  5 units NOVOLOG  256 (H) 279 (H)  3 units NOVOLOG +  20 units LANTUS    Results for Mark Bean, Mark Bean (MRN [id number]) as of 07/07/2018 07:51  Ref. Range 07/07/2018 07:42  Glucose-Capillary Latest Ref Range: 70 - 99 mg/dL 357 (H)     Home DM Meds: Lantus 28unitsQHS  Novolog 20 unitsBID  Victoza 1.2 mg QHS   Current: Novolog Moderate Correction Scale/ SSI (0-15 units) TID AC + HS    Lantus 20 units QHS      CBGs have been stable for the last few days, however now with severe elevations.    MD- Please consider the following in-hospital insulin adjustments:  1. Increase Lantus to 28 units QHS (home dose)  Please consider giving an extra 8 units Lantus X 1 dose this AM   2. Start Novolog Meal Coverage: Novolog 6 units TID with meals  (Please add the following Hold Parameters: Hold if pt eats <50% of meal, Hold if pt NPO)      --Will follow patient during hospitalization--  Wyn Quaker RN, MSN, CDE Diabetes Coordinator Inpatient Glycemic Control Team Team Pager: [phone] (8a-5p)

## 2018-07-10 LAB — CYTOLOGY - NON PAP

## 2018-07-17 ENCOUNTER — Other Ambulatory Visit: Payer: Self-pay

## 2018-07-17 ENCOUNTER — Emergency Department
Admission: EM | Admit: 2018-07-17 | Discharge: 2018-07-17 | Disposition: A | Discharge status: Home / Self Care | Payer: No Typology Code available for payment source | Attending: Emergency Medicine | Admitting: Emergency Medicine

## 2018-07-17 ENCOUNTER — Encounter: Payer: Self-pay | Admitting: Emergency Medicine

## 2018-07-17 ENCOUNTER — Ambulatory Visit: Payer: Self-pay | Admitting: Surgery

## 2018-07-17 DIAGNOSIS — Z96651 Presence of right artificial knee joint: Secondary | ICD-10-CM | POA: Diagnosis not present

## 2018-07-17 DIAGNOSIS — Z7982 Long term (current) use of aspirin: Secondary | ICD-10-CM | POA: Insufficient documentation

## 2018-07-17 DIAGNOSIS — E1165 Type 2 diabetes mellitus with hyperglycemia: Secondary | ICD-10-CM | POA: Insufficient documentation

## 2018-07-17 DIAGNOSIS — I252 Old myocardial infarction: Secondary | ICD-10-CM | POA: Diagnosis not present

## 2018-07-17 DIAGNOSIS — Z96652 Presence of left artificial knee joint: Secondary | ICD-10-CM | POA: Insufficient documentation

## 2018-07-17 DIAGNOSIS — R739 Hyperglycemia, unspecified: Secondary | ICD-10-CM | POA: Diagnosis present

## 2018-07-17 DIAGNOSIS — Z951 Presence of aortocoronary bypass graft: Secondary | ICD-10-CM | POA: Insufficient documentation

## 2018-07-17 DIAGNOSIS — Z794 Long term (current) use of insulin: Secondary | ICD-10-CM | POA: Insufficient documentation

## 2018-07-17 DIAGNOSIS — I10 Essential (primary) hypertension: Secondary | ICD-10-CM | POA: Insufficient documentation

## 2018-07-17 DIAGNOSIS — I2510 Atherosclerotic heart disease of native coronary artery without angina pectoris: Secondary | ICD-10-CM | POA: Diagnosis not present

## 2018-07-17 DIAGNOSIS — Z79899 Other long term (current) drug therapy: Secondary | ICD-10-CM | POA: Diagnosis not present

## 2018-07-17 LAB — URINALYSIS, COMPLETE (UACMP) WITH MICROSCOPIC
Bilirubin Urine: NEGATIVE
Glucose, UA: >=500 mg/dL — AB
Hgb urine dipstick: NEGATIVE
Ketones, ur: NEGATIVE mg/dL
Nitrite: NEGATIVE
Protein, ur: NEGATIVE mg/dL
Specific Gravity, Urine: 1.027 (ref 1.005–1.030)
pH: 6 (ref 5.0–8.0)

## 2018-07-17 LAB — COMPREHENSIVE METABOLIC PANEL
ALT: 26 U/L (ref 0–44)
AST: 21 U/L (ref 15–41)
Albumin: 3.3 g/dL — ABNORMAL LOW (ref 3.5–5.0)
Alkaline Phosphatase: 97 U/L (ref 38–126)
Anion gap: 11 (ref 5–15)
BUN: 17 mg/dL (ref 8–23)
CO2: 26 mmol/L (ref 22–32)
Calcium: 9.2 mg/dL (ref 8.9–10.3)
Chloride: 96 mmol/L — ABNORMAL LOW (ref 98–111)
Creatinine, Ser: 1.24 mg/dL (ref 0.61–1.24)
GFR calc Af Amer: >60 mL/min (ref >60)
GFR calc non Af Amer: 57 mL/min — ABNORMAL LOW (ref >60)
Glucose, Bld: 677 mg/dL (ref 70–99)
Potassium: 4.4 mmol/L (ref 3.5–5.1)
Sodium: 133 mmol/L — ABNORMAL LOW (ref 135–145)
Total Bilirubin: 0.5 mg/dL (ref 0.3–1.2)
Total Protein: 6.6 g/dL (ref 6.5–8.1)

## 2018-07-17 LAB — CBC WITH DIFFERENTIAL/PLATELET
Abs Immature Granulocytes: 0.03 10*3/uL (ref 0.00–0.07)
Basophils Absolute: 0.1 10*3/uL (ref 0.0–0.1)
Basophils Relative: 1 %
Eosinophils Absolute: 0.1 10*3/uL (ref 0.0–0.5)
Eosinophils Relative: 1 %
HCT: 36.1 % — ABNORMAL LOW (ref 39.0–52.0)
Hemoglobin: 11.8 g/dL — ABNORMAL LOW (ref 13.0–17.0)
Immature Granulocytes: 0 %
Lymphocytes Relative: 12 %
Lymphs Abs: 1.2 10*3/uL (ref 0.7–4.0)
MCH: 28.6 pg (ref 26.0–34.0)
MCHC: 32.7 g/dL (ref 30.0–36.0)
MCV: 87.6 fL (ref 80.0–100.0)
Monocytes Absolute: 0.4 10*3/uL (ref 0.1–1.0)
Monocytes Relative: 4 %
Neutro Abs: 8 10*3/uL — ABNORMAL HIGH (ref 1.7–7.7)
Neutrophils Relative %: 82 %
Platelets: 271 10*3/uL (ref 150–400)
RBC: 4.12 MIL/uL — ABNORMAL LOW (ref 4.22–5.81)
RDW: 12.3 % (ref 11.5–15.5)
WBC: 9.8 10*3/uL (ref 4.0–10.5)
nRBC: 0 % (ref 0.0–0.2)

## 2018-07-17 LAB — GLUCOSE, CAPILLARY
Glucose-Capillary: >600 mg/dL (ref 70–99)
Glucose-Capillary: 521 mg/dL (ref 70–99)
Glucose-Capillary: 369 mg/dL — ABNORMAL HIGH (ref 70–99)
Glucose-Capillary: 438 mg/dL — ABNORMAL HIGH (ref 70–99)

## 2018-07-17 LAB — BLOOD GAS, VENOUS

## 2018-07-17 MED ORDER — INSULIN ASPART 100 UNIT/ML ~~LOC~~ SOLN
10.0000 [IU] | Freq: Once | SUBCUTANEOUS | Status: AC
  Administered 2018-07-17: 10 [IU] via INTRAVENOUS
  Filled 2018-07-17: qty 1

## 2018-07-17 MED ORDER — SODIUM CHLORIDE 0.9 % IV BOLUS
1000.0000 mL | Freq: Once | INTRAVENOUS | Status: AC
  Administered 2018-07-17: 1000 mL via INTRAVENOUS

## 2018-07-17 MED ORDER — INSULIN ASPART 100 UNIT/ML ~~LOC~~ SOLN
8.0000 [IU] | Freq: Once | SUBCUTANEOUS | Status: AC
  Administered 2018-07-17: 8 [IU] via INTRAVENOUS
  Filled 2018-07-17: qty 1

## 2018-07-17 NOTE — ED Provider Notes (Signed)
[facility] Emergency Department Provider Note ____________   First MD Initiated Contact with Patient 07/17/18 1822     (approximate)  I have reviewed the triage vital signs and the nursing notes.   HISTORY  Chief Complaint Hyperglycemia  HPI Mark Bean is a 73 y.o. male with below list of previous medical conditions including diabetes mellitus presents to the emergency department with noted hyperglycemia which was noted today when he was seen by Dr. Tonna BoehringerSakai general surgeon in consultation for laparoscopic cholecystectomy.  Patient does admit to drinking multiple beverages today that were high in sugar content including orange juice grape soda and beer.  Patient denies any complaints other than "feeling thirsty".          Past Medical History:  Diagnosis Date  . Anxiety   . Arthritis   . Coronary artery disease   . Diabetes mellitus without complication (HCC)   . Exposure to [employer]   . Hypertension    dr Shonna Chock[place]aski    [phone]  . Kidney stones   . Myocardial infarction (HCC)    silent mi  . Pancreatitis, acute    caused by medicaion  . Sleep apnea    c pap    Patient Active Problem List   Diagnosis Date Noted  . Elevated liver enzymes   . Stenosis of bile duct   . Pancreatitis 06/30/2018  . Choledocholithiasis 06/30/2018  . OSA (obstructive sleep apnea) 06/30/2018  . HTN (hypertension) 06/30/2018  . CAD (coronary artery disease) 06/30/2018  . Diabetes (HCC) 06/30/2018  . AKI (acute kidney injury) (HCC) 06/30/2018  . Non-pressure chronic ulcer of right calf, limited to breakdown of skin (HCC) 01/29/2016  . S/P unilateral BKA (below knee amputation), right (HCC) 12/03/2015  . 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 Subacute osteomyelitis, right ankle and foot [facility](HCC)     Past Surgical History:  Procedure Laterality Date  . AMPUTATION Right 10/11/2012   Procedure: 2nd ray amputation, medial column fusion and  ostectomy right foot;  Surgeon: Nadara MustardMarcus V Duda, MD;  Location: [facility] OR;  Service: Orthopedics;  Laterality: Right;  . AMPUTATION Right 06/15/2013   Procedure: AMPUTATION DIGIT;  Surgeon: Nadara MustardMarcus Duda V, MD;  Location: [facility] OR;  Service: Orthopedics;  Laterality: Right;  Right 3rd Toe Amputation  . AMPUTATION Right 12/03/2015   Procedure: AMPUTATION BELOW RIGHT KNEE;  Surgeon: Nadara MustardMarcus V Duda, MD;  Location: [facility] OR;  Service: Orthopedics;  Laterality: Right;  . ANKLE FUSION Right 10/11/2012   Procedure: ARTHRODESIS ANKLE;  Surgeon: Nadara MustardMarcus V Duda, MD;  [facility];  Service: Orthopedics;  Laterality: Right;  . APPLICATION OF WOUND VAC  12/03/2015  . CARDIAC CATHETERIZATION     2000  . CATARACT EXTRACTION W/PHACO Right 04/06/2017   Procedure: CATARACT EXTRACTION PHACO AND INTRAOCULAR LENS PLACEMENT (IOC);  Surgeon: Nevada CraneKing, Bradley Mark, MD;  Location: [facility] ORS;  Service: Ophthalmology;  Laterality: Right;  US 01:01.5 AP% 13.1 CDE 8.07 Fluid pack lot # [id number] H  . COLONOSCOPY    . CORONARY ANGIOPLASTY     stent  . CORONARY ARTERY BYPASS GRAFT     2000   1 stent  . ENDOSCOPIC RETROGRADE CHOLANGIOPANCREATOGRAPHY (ERCP) WITH PROPOFOL N/A 07/02/2018   Procedure: ENDOSCOPIC RETROGRADE CHOLANGIOPANCREATOGRAPHY (ERCP) WITH PROPOFOL;  Surgeon: Midge MiniumWohl, Darren, MD;  Location: [facility] ENDOSCOPY;  Service: Endoscopy;  Laterality: N/A;  . EYE SURGERY Right    cataract   . HARDWARE REMOVAL Right 01/05/2013   Procedure: HARDWARE REMOVAL;  Surgeon: Nadara MustardMarcus V Duda, MD;  [facility];  Service: Orthopedics;  Laterality: Right;  Right Foot Debridement, Placement antibiotic beads, remove screw  . I&D EXTREMITY Right 11/10/2012   Procedure: IRRIGATION AND DEBRIDEMENT EXTREMITY;  Surgeon: Nadara MustardMarcus V Duda, MD;  Location: [facility] OR;  Service: Orthopedics;  Laterality: Right;  Irrigation and Debridement Right Foot Wound,  Antibiotic Beads and Wound VAC  . JOINT REPLACEMENT Left    knee  . REPLACEMENT TOTAL KNEE Right 02/15/2011  .  stents    . TONSILLECTOMY    . VASECTOMY      Prior to Admission medications   Medication Sig Start Date End Date Taking? Authorizing Provider  amLODipine (NORVASC) 10 MG tablet Take 5 mg by mouth daily.  11/25/17 11/25/18  [provider]  aspirin EC 81 MG tablet Take 81 mg by mouth at bedtime.     [provider]  atorvastatin (LIPITOR) 80 MG tablet Take 80 mg by mouth daily.    [provider]  carvedilol (COREG) 12.5 MG tablet Take 12.5 mg by mouth 2 (two) times daily with a meal.    [provider]  cholecalciferol (VITAMIN D) 1000 units tablet Take 2,000 Units by mouth daily.    [provider]  citalopram (CELEXA) 40 MG tablet Take 20 mg by mouth daily.    [provider]  cyanocobalamin 1000 MCG tablet Take 1,000 mcg by mouth daily.     [provider]  gabapentin (NEURONTIN) 100 MG capsule TAKE 1 CAPSULE BY MOUTH THREE TIMES A DAY Patient taking differently: Take 100-200 mg by mouth 2 (two) times daily. 100mg  in the morning and 200mg  at bedtime 01/03/17   Nadara Mustarduda, Marcus V, MD  insulin aspart (NOVOLOG) 100 unit/mL injection Inject 20 Units into the skin 2 (two) times a day.     [provider]  insulin glargine (LANTUS) 100 UNIT/ML injection Inject 28 Units into the skin at bedtime.     [provider]  liraglutide (VICTOZA) 18 MG/3ML SOPN Inject 1.2 mg into the skin at bedtime.    [provider]  lisinopril (PRINIVIL,ZESTRIL) 40 MG tablet Take 40 mg by mouth daily.    [provider]  Melatonin 10 MG TABS Take 10 mg by mouth at bedtime.     [provider]  Omega-3 Fatty Acids (FISH OIL) 1200 MG CAPS Take 1,200 mg by mouth daily.     [provider]  saccharomyces boulardii (FLORASTOR) 250 MG capsule Take 1 capsule (250 mg total) by mouth 2 (two) times daily. 07/07/18   Milagros LollSudini, Srikar, MD  vancomycin (VANCOCIN) 125 MG capsule Take 1 capsule (125 mg total) by mouth 4 (four)  times daily. 07/07/18   Milagros LollSudini, Srikar, MD    Allergies Patient has no known allergies.  No family history on file.  Social History Social History   Tobacco Use  . Smoking status: Never Smoker  . Smokeless tobacco: Never Used  Substance Use Topics  . Alcohol use: Yes    Alcohol/week: 12.0 standard drinks    Types: 12 Cans of beer per week  . Drug use: No    Review of Systems Constitutional: No fever/chills Eyes: No visual changes. ENT: No sore throat. Cardiovascular: Denies chest pain. Respiratory: Denies shortness of breath. Gastrointestinal: No abdominal pain.  No nausea, no vomiting.  No diarrhea.  No constipation. Genitourinary: Negative for dysuria. Musculoskeletal: Negative for neck pain.  Negative for back pain. Integumentary: Negative for rash. Neurological: Negative for headaches,  focal weakness or numbness. Endocrine:  Positive for hyperglycemia   ____________________________________________   PHYSICAL EXAM:  VITAL SIGNS: ED Triage Vitals  Enc Vitals Group     BP 07/17/18 1553 115/84     Pulse Rate 07/17/18 1553 88     Resp 07/17/18 1553 16     Temp 07/17/18 1553 99.2 F (37.3 C)     Temp Source 07/17/18 1553 Oral     SpO2 07/17/18 1553 97 %     Weight 07/17/18 1554 90.7 kg (200 lb)     Height 07/17/18 1554 1.727 m (5\' 8" )     Head Circumference --      Peak Flow --      Pain Score 07/17/18 1554 0     Pain Loc --      Pain Edu? --      Excl. in GC? --     Constitutional: Alert and oriented. Well appearing and in no acute distress. Eyes: Conjunctivae are normal. Mouth/Throat: Mucous membranes are moist. Oropharynx non-erythematous. Neck: No stridor.   Cardiovascular: Normal rate, regular rhythm. Good peripheral circulation. Grossly normal heart sounds. Respiratory: Normal respiratory effort.  No retractions. No audible wheezing. Gastrointestinal: Soft and nontender. No distention.  Musculoskeletal: No lower extremity tenderness nor edema. No  gross deformities of extremities. Neurologic:  Normal speech and language. No gross focal neurologic deficits are appreciated.  Skin:  Skin is warm, dry and intact. No rash noted. Psychiatric: Mood and affect are normal. Speech and behavior are normal.  ____________________________________________   LABS (all labs ordered are listed, but only abnormal results are displayed)  Labs Reviewed  GLUCOSE, CAPILLARY - Abnormal; Notable for the following components:      Result Value   Glucose-Capillary >600 (*)    All other components within normal limits  CBC WITH DIFFERENTIAL/PLATELET - Abnormal; Notable for the following components:   RBC 4.12 (*)    Hemoglobin 11.8 (*)    HCT 36.1 (*)    Neutro Abs 8.0 (*)    All other components within normal limits  COMPREHENSIVE METABOLIC PANEL - Abnormal; Notable for the following components:   Sodium 133 (*)    Chloride 96 (*)    Glucose, Bld 677 (*)    Albumin 3.3 (*)    GFR calc non Af Amer 57 (*)    All other components within normal limits  BLOOD GAS, VENOUS - Abnormal; Notable for the following components:   Bicarbonate 29.8 (*)    Acid-Base Excess 4.3 (*)    All other components within normal limits  URINALYSIS, COMPLETE (UACMP) WITH MICROSCOPIC - Abnormal; Notable for the following components:   Color, Urine STRAW (*)    APPearance CLEAR (*)    Glucose, UA >=500 (*)    Leukocytes,Ua SMALL (*)    Bacteria, UA RARE (*)    All other components within normal limits  GLUCOSE, CAPILLARY - Abnormal; Notable for the following components:   Glucose-Capillary 521 (*)    All other components within normal limits      Procedures   ____________________________________________   INITIAL IMPRESSION / MDM / ASSESSMENT AND PLAN / ED COURSE  As part of my medical decision making, I reviewed the following data within the electronic MEDICAL RECORD NUMBER 73 year old male presenting with above-stated history and physical exam referred from  general surgery secondary hyperglycemia.  Patient noted to have glucose of 677 on arrival.  Patient received 2 L IV normal saline and 8 units of IV insulin   *  Mark Bean was evaluated in Emergency Department on 07/17/2018 for the symptoms described in the history of present illness. He was evaluated in the context of the global COVID-19 pandemic, which necessitated consideration that the patient might be at risk for infection with the SARS-CoV-2 virus that causes COVID-19. Institutional protocols and algorithms that pertain to the evaluation of patients at risk for COVID-19 are in a state of rapid change based on information released by regulatory bodies including the CDC and federal and state organizations. These policies and algorithms were followed during the patient's care in the ED.  Some ED evaluations and interventions may be delayed as a result of limited staffing during the pandemic.*       ____________________________________________  FINAL CLINICAL IMPRESSION(S) / ED DIAGNOSES  Final diagnoses:  Hyperglycemia     MEDICATIONS GIVEN DURING THIS VISIT:  Medications  sodium chloride 0.9 % bolus 1,000 mL (1,000 mLs Intravenous New Bag/Given 07/17/18 1834)  insulin aspart (novoLOG) injection 8 Units (8 Units Intravenous Given 07/17/18 1835)     ED Discharge Orders    None       Note:  This document was prepared using Dragon voice recognition software and may include unintentional dictation errors.   Gregor Hams, MD 07/17/18 [phone]

## 2018-07-17 NOTE — H&P (Signed)
       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 male who is here for followup from above.  No abdominal issues since last hospitalization.  No more diarrhea, but states he has increased thirst and fatigue.  Home glucose checks have been in the 500s.     Current Medications: has a current medication list which includes the following prescription(s): amlodipine, aspirin, atorvastatin, carvedilol, cholecalciferol, citalopram, cyanocobalamin, glucose, glucagon, insulin aspart, insulin glargine, lisinopril, metformin, niacin, omega-3s-dha-epa-fish oil, potassium gluconate, prazosin, saccharomyces boulardii, vancomycin, zolpidem, and chlorthalidone.  Allergies:      Allergies  Allergen Reactions  . Metformin Diarrhea    ROS: General: Denies weight loss, weight gain, fevers, chills, and night sweats. Heart: Denies chest pain, palpitations, racing heart, irregular heartbeat, leg pain or swelling, and decreased activity tolerance. Respiratory: Denies breathing difficulty, shortness of breath, wheezing, cough, and sputum. GI: Denies change in appetite, heartburn, nausea, vomiting, constipation, diarrhea, and blood in stool. GU: Denies difficulty urinating, pain with urinating, urgency, frequency, blood in urine    Objective:   BP 127/74   Pulse 85   Ht 172.7 cm (5\' 8" )   Wt (!) 102.1 kg (225 lb)   BMI 34.21 kg/m   Constitutional :  alert, appears stated age, cooperative and no distress  Gastrointestinal: soft, non-tender; bowel sounds normal; no masses,  no organomegaly.    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 Will schedule for robotic assisted cholecystectomy due to previous pancreatitis  likely caused by gallstones.    Discussed the risk of surgery including post-op infxn, seroma, biloma, chronic pain, poor-delayed wound healing, retained gallstone, conversion to open procedure, post-op SBO or ileus, and need for additional procedures to address said risks.  The risks of general anesthetic including MI, CVA, sudden death or even reaction to anesthetic medications also discussed. Alternatives include continued observation.  Benefits include possible symptom relief, prevention of complications including acute cholecystitis, pancreatitis.  Typical post operative recovery of 3-5 days rest, continued pain in area and incision sites, possible loose stools up to 4-6 weeks, also discussed.  ED return precautions given for sudden increase in RUQ pain, with possible accompanying fever, nausea, and/or vomiting.  The patient understands the risks, any and all questions were answered to the patient's satisfaction.  2.  Reported hyperglycemia.  Will start with basic labs, and possible ER transfer if needed to obtain better control of DM.  Places him at increased risk for perioperative complications and possible delay in surgery if unable to obtain adequate control by then.

## 2018-07-17 NOTE — ED Notes (Signed)
Reported glucose to RN KW.

## 2018-07-17 NOTE — ED Triage Notes (Signed)
PT sent by surgeon due to hyperglycemic reading in the 600s. Pt denies any symptoms other than being thirsty.

## 2018-07-18 NOTE — Discharge Summary (Signed)
[facility] -  at [facility]   PATIENT NAME: Mark Bean    MR#:  [id number]  DATE OF BIRTH:  12/20/1945  DATE OF ADMISSION:  06/29/2018 ADMITTING PHYSICIAN: Oralia Manisavid Willis, MD  DATE OF DISCHARGE: 07/07/2018 12:30 PM  PRIMARY CARE PHYSICIAN: [facility]   ADMISSION DIAGNOSIS:  SOB (shortness of breath) [R06.02] Abdominal pain [R10.9]  DISCHARGE DIAGNOSIS:  Principal Problem:   Pancreatitis Active Problems:   Choledocholithiasis   OSA (obstructive sleep apnea)   HTN (hypertension)   CAD (coronary artery disease)   Diabetes (HCC)   AKI (acute kidney injury) (HCC)   Elevated liver enzymes   Stenosis of bile duct   SECONDARY DIAGNOSIS:   Past Medical History:  Diagnosis Date  . Anxiety   . Arthritis   . Coronary artery disease   . Diabetes mellitus without complication (HCC)   . Exposure to [employer] [place]   . Hypertension    dr Shonna Chock[place]i    [phone]  . Kidney stones   . Myocardial infarction (HCC)    silent mi  . Pancreatitis, acute    caused by medicaion  . Sleep apnea    c pap   ADMITTING HISTORY  Mark Bean  is a 73 y.o. male who presents with chief complaint as above.  Presents the ED with acute onset of severe abdominal pain rating to his back.  This started earlier today.  He has significant associated nausea with vomiting.  On evaluation here in the ED is found to have a lipase of 4000, with choledocholithiasis seen on CT imaging.  GI was contacted by ED physician and hospitalist were called for admission  HOSPITAL COURSE:   Mark Bean presents with anxiety, arthritis, CAD, diabetes Presents the ED with acute onset of severe abdominal pain rating to his back. This started earlier today. He has significant associated nausea with vomiting. On evaluation here in the ED is found to have a lipase of 4000, with Choledocholithiasis seen on CT imaging  *C. difficile infection Watery diarrhea and C.  difficile  Positive. Patient's diarrhea is almost resolved prior to discharge.  No abdominal pain.  Afebrile.  * Acute pancreatitis , gallstone induced with  choledocholithiasis Treated with n.p.o. and IV fluids. Initially significant increase in LFTs with started improving after ERCP. -IV morphine PRN in hospital ERCP by Dr. Servando SnareWohl: A single segmental biliary stricture was found in the  lower third of the main bile duct. -  A biliary sphincterotomy was performed. - Cells for cytology obtained in the lower third of the  main duct. - One plastic stent was placed into the common bile  duct. Repeat ERCP in 2 months to remove stent per Dr. Servando SnareWohl. Liver function tests close to normal Tolerating diet MRCP and MRI of abdomen showed no mass.  * cholelithiasis  lap chole per Dr. Tonna BoehringerSakai. Cholecystectomy as outpatient due to acute C. difficile infection  ARF due to dehydration.  Resolved  Hypokalemia. Potassium supplementation Hypomagnesemia.  Improved with IV magnesium.  * Type II diabetes, hyperglycemia, increased Lantus to 20 unit HS, change to moderate sliding scale insulin  * Hypertension continue home meds  * CAD continue home meds  *  Acute metabolic encephalopathy.  Due to acute illness and inpatient delirium.  Was on Precedex drip which has been weaned off.  Patient's mental status has returned to normal.  CT head normal.  Ammonia level normal.  Stable for discharge home with home health services  CONSULTS OBTAINED:  Pulmonary critical care GI Surgery  DRUG ALLERGIES:  No Known Allergies  DISCHARGE MEDICATIONS:   Allergies as of 07/07/2018   No Known Allergies     Medication List    STOP taking these medications   chlorthalidone 50 MG tablet Commonly known as: HYGROTON     TAKE these medications   amLODipine 10 MG  tablet Commonly known as: NORVASC Take 5 mg by mouth daily.   aspirin EC 81 MG tablet Take 81 mg by mouth at bedtime.   atorvastatin 80 MG tablet Commonly known as: LIPITOR Take 80 mg by mouth daily.   carvedilol 12.5 MG tablet Commonly known as: COREG Take 12.5 mg by mouth 2 (two) times daily with a meal.   cholecalciferol 1000 units tablet Commonly known as: VITAMIN D Take 2,000 Units by mouth daily.   citalopram 40 MG tablet Commonly known as: CELEXA Take 20 mg by mouth daily.   cyanocobalamin 1000 MCG tablet Take 1,000 mcg by mouth daily.   Fish Oil 1200 MG Caps Take 1,200 mg by mouth daily.   gabapentin 100 MG capsule Commonly known as: NEURONTIN TAKE 1 CAPSULE BY MOUTH THREE TIMES A DAY What changed: See the new instructions.   insulin aspart 100 unit/mL injection Commonly known as: novoLOG Inject 20 Units into the skin 2 (two) times a day.   insulin glargine 100 UNIT/ML injection Commonly known as: LANTUS Inject 28 Units into the skin at bedtime.   liraglutide 18 MG/3ML Sopn Commonly known as: VICTOZA Inject 1.2 mg into the skin at bedtime.   lisinopril 40 MG tablet Commonly known as: ZESTRIL Take 40 mg by mouth daily.   Melatonin 10 MG Tabs Take 10 mg by mouth at bedtime.   saccharomyces boulardii 250 MG capsule Commonly known as: Florastor Take 1 capsule (250 mg total) by mouth 2 (two) times daily.   vancomycin 125 MG capsule Commonly known as: VANCOCIN Take 1 capsule (125 mg total) by mouth 4 (four) times daily.       Today   VITAL SIGNS:  Blood pressure 137/65, pulse 63, temperature 98.7 F (37.1 C), temperature source Oral, resp. rate 16, height 5\' 8"  (1.727 m), weight 100.8 kg, SpO2 97 %.  I/O:  No intake or output data in the 24 hours ending 07/18/18 1528  PHYSICAL EXAMINATION:  Physical Exam  GENERAL:  73 y.o.-year-old patient lying in the bed with no acute distress.  LUNGS: Normal breath sounds bilaterally, no wheezing,  rales,rhonchi or crepitation. No use of accessory muscles of respiration.  CARDIOVASCULAR: S1, S2 normal. No murmurs, rubs, or gallops.  ABDOMEN: Soft, non-tender, non-distended. Bowel sounds present. No organomegaly or mass.  NEUROLOGIC: Moves all 4 extremities. PSYCHIATRIC: The patient is alert and oriented x 3.  SKIN: No obvious rash, lesion, or ulcer.   DATA REVIEW:   CBC Recent Labs  Lab 07/17/18 1604  WBC 9.8  HGB 11.8*  HCT 36.1*  PLT 271    Chemistries  Recent Labs  Lab 07/17/18 1604  NA 133*  K 4.4  CL 96*  CO2 26  GLUCOSE 677*  BUN 17  CREATININE 1.24  CALCIUM 9.2  AST 21  ALT 26  ALKPHOS 97  BILITOT 0.5    Cardiac Enzymes No results for input(s): TROPONINI in the last 168 hours.  Microbiology Results  Results for orders placed or performed during the hospital encounter of 06/29/18  Novel Coronavirus,NAA,(SEND-OUT TO REF LAB - TAT 24-48 hrs); Hosp Order     Status: None  Collection Time: 06/30/18  1:00 AM   Specimen: Nasopharyngeal Swab; Respiratory  Result Value Ref Range Status   SARS-CoV-2, NAA NOT DETECTED NOT DETECTED Final    Comment: (NOTE) This test was developed and its performance characteristics determined by [facility]. This test has not been FDA cleared or approved. This test has been authorized by FDA under an Emergency Use Authorization (EUA). This test is only authorized for the duration of time the declaration that circumstances exist justifying the authorization of the emergency use of in vitro diagnostic tests for detection of SARS-CoV-2 virus and/or diagnosis of COVID-19 infection under section 564(b)(1) of the Act, 21 U.S.C. 161WRU-0(A)(5360bbb-3(b)(1), unless the authorization is terminated or revoked sooner. When diagnostic testing is negative, the possibility of a false negative result should be considered in the context of a patient's recent exposures and the presence of clinical signs and symptoms consistent with COVID-19.  An individual without symptoms of COVID-19 and who is not shedding SARS-CoV-2 virus would expect to have a negative (not detected) result in this assay. Performed  At: [facility] [address] [place], [place] [id number] Jolene SchimkeNagendra Sanjai MD NW:[id number]:[phone]    Coronavirus Source NASOPHARYNGEAL  Final    Comment: Performed at [facility], [address] [place], [place] [id number]  SARS Coronavirus 2 [facility](Hospital order, Performed in [facility])     Status: None   Collection Time: 06/30/18  2:49 PM   Specimen: Nasopharyngeal Swab  Result Value Ref Range Status   SARS Coronavirus 2 NEGATIVE NEGATIVE Final    Comment: (NOTE) If result is NEGATIVE SARS-CoV-2 target nucleic acids are NOT DETECTED. The SARS-CoV-2 RNA is generally detectable in upper and lower  respiratory specimens during the acute phase of infection. The lowest  concentration of SARS-CoV-2 viral copies this assay can detect is 250  copies / mL. A negative result does not preclude SARS-CoV-2 infection  and should not be used as the sole basis for treatment or other  patient management decisions.  A negative result may occur with  improper specimen collection / handling, submission of specimen other  than nasopharyngeal swab, presence of viral mutation(s) within the  areas targeted by this assay, and inadequate number of viral copies  (<250 copies / mL). A negative result must be combined with clinical  observations, patient history, and epidemiological information. If result is POSITIVE SARS-CoV-2 target nucleic acids are DETECTED. The SARS-CoV-2 RNA is generally detectable in upper and lower  respiratory specimens dur ing the acute phase of infection.  Positive  results are indicative of active infection with SARS-CoV-2.  Clinical  correlation with patient history and other diagnostic information is  necessary to determine patient infection status.  Positive results do  not rule out  bacterial infection or co-infection with other viruses. If result is PRESUMPTIVE POSTIVE SARS-CoV-2 nucleic acids MAY BE PRESENT.   A presumptive positive result was obtained on the submitted specimen  and confirmed on repeat testing.  While 2019 novel coronavirus  (SARS-CoV-2) nucleic acids may be present in the submitted sample  additional confirmatory testing may be necessary for epidemiological  and / or clinical management purposes  to differentiate between  SARS-CoV-2 and other Sarbecovirus currently known to infect humans.  If clinically indicated additional testing with an alternate test  methodology [phone]([id number]) is advised. The SARS-CoV-2 RNA is generally  detectable in upper and lower respiratory sp ecimens during the acute  phase of infection. The expected result is Negative. Fact Sheet for Patients:  StrictlyIdeas.no Fact Sheet for Healthcare Providers: BankingDealers.co.za This test is not yet approved or cleared by the [place] FDA and has been authorized for detection and/or diagnosis of SARS-CoV-2 by FDA under an Emergency Use Authorization (EUA).  This EUA will remain in effect (meaning this test can be used) for the duration of the COVID-19 declaration under Section 564(b)(1) of the Act, 21 U.S.C. section 360bbb-3(b)(1), unless the authorization is terminated or revoked sooner. Performed at [facility], [address] [place], [place] [address]   Culture, blood (routine x 2)     Status: None   Collection Time: 07/01/18  1:25 PM   Specimen: BLOOD  Result Value Ref Range Status   Specimen Description BLOOD L ARM  Final   Special Requests   Final    BOTTLES DRAWN AEROBIC AND ANAEROBIC Blood Culture results may not be optimal due to an excessive volume of blood received in culture bottles   Culture   Final    NO GROWTH 5 DAYS Performed at [facility], [address] [place], [place] [address]     Report Status 07/06/2018 FINAL  Final  Culture, blood (routine x 2)     Status: None   Collection Time: 07/01/18  2:38 PM   Specimen: BLOOD  Result Value Ref Range Status   Specimen Description BLOOD BLOOD LEFT ARM  Final   Special Requests   Final    BOTTLES DRAWN AEROBIC AND ANAEROBIC Blood Culture results may not be optimal due to an excessive volume of blood received in culture bottles   Culture   Final    NO GROWTH 5 DAYS Performed at [facility], [address] [place], [place] [address]    Report Status 07/06/2018 FINAL  Final  C difficile quick scan w PCR reflex     Status: Abnormal   Collection Time: 07/06/18 11:10 AM   Specimen: STOOL  Result Value Ref Range Status   C Diff antigen POSITIVE (A) NEGATIVE Final   C Diff toxin POSITIVE (A) NEGATIVE Final   C Diff interpretation Toxin producing C. difficile detected.  Final    Comment: CRITICAL RESULT CALLED TO, READ BACK BY AND VERIFIED WITH: Jacqualine Mau 07/06/18 1223 KLW Performed at [facility], [address], [place], [place] [address]     RADIOLOGY:  No results found.  Follow up with PCP in 1 week.  Management plans discussed with the patient, family and they are in agreement.  CODE STATUS:  Code Status History    Date Active Date Inactive Code Status Order ID Comments User Context   06/30/2018 0156 07/07/2018 1627 Full Code [id number]  Lance Coon, MD ED   12/03/2015 1316 12/06/2015 1527 Full Code [id number]  Newt Minion, MD Inpatient   11/10/2012 1610 11/14/2012 1505 Full Code [id number]  Newt Minion, MD Inpatient   10/11/2012 1215 10/13/2012 1752 Full Code [id number]  Newt Minion, MD Inpatient   Advance Care Planning Activity    Advance Directive Documentation     Most Recent Value  Type of Advance Directive  Living will  Pre-existing out of facility DNR order (yellow form or pink MOST form)  -  "MOST" Form in Place?  -      TOTAL TIME TAKING CARE OF THIS PATIENT ON DAY  OF DISCHARGE: more than 30 minutes.   Leia Alf Kymir Coles M.D on 07/18/2018 at 3:28 PM  Between 7am to 6pm - Pager - [phone]  After 6pm go to  [email or web address] - password [email or web address]  [facility]  Office  [phone]  CC: Primary care physician; [facility]  Note: This dictation was prepared with Dragon dictation along with smaller phrase technology. Any transcriptional errors that result from this process are unintentional.

## 2018-07-31 LAB — BLOOD GAS, VENOUS
Acid-Base Excess: 4.3 mmol/L — ABNORMAL HIGH (ref 0.0–2.0)
Bicarbonate: 29.8 mmol/L — ABNORMAL HIGH (ref 20.0–28.0)
O2 Saturation: 53.6 %
Patient temperature: 37
pCO2, Ven: 47 mmHg (ref 44.0–60.0)
pH, Ven: 7.41 (ref 7.250–7.430)

## 2018-08-02 ENCOUNTER — Inpatient Hospital Stay: Admission: RE | Admit: 2018-08-02 | Payer: Medicare Other | Source: Ambulatory Visit

## 2018-08-07 ENCOUNTER — Other Ambulatory Visit: Admission: RE | Admit: 2018-08-07 | Payer: No Typology Code available for payment source | Source: Ambulatory Visit

## 2018-08-08 ENCOUNTER — Other Ambulatory Visit: Payer: No Typology Code available for payment source

## 2018-08-11 ENCOUNTER — Encounter: Admission: RE | Payer: Self-pay | Source: Home / Self Care

## 2018-08-11 ENCOUNTER — Ambulatory Visit: Admission: RE | Admit: 2018-08-11 | Payer: Medicare Other | Source: Home / Self Care | Admitting: Surgery

## 2018-08-11 SURGERY — ROBOTIC ASSISTED LAPAROSCOPIC CHOLECYSTECTOMY
Anesthesia: General

## 2018-08-24 ENCOUNTER — Other Ambulatory Visit: Payer: Self-pay

## 2018-08-24 ENCOUNTER — Telehealth: Payer: Self-pay

## 2018-08-24 DIAGNOSIS — Z9889 Other specified postprocedural states: Secondary | ICD-10-CM

## 2018-08-24 NOTE — Telephone Encounter (Signed)
Pt has been scheduled for a RUQ abdominal US at [facility] on Tuesday, August 11th at 8:30am. Pt has been advised to arrive at 8:15am and to be npo after midnight on Monday night.

## 2018-08-24 NOTE — Telephone Encounter (Signed)
-----   Message from Lucilla Lame, MD sent at 08/24/2018  3:12 PM EDT ----- Regarding: RE: Right upper quadrant u/s is likely better to see if the stent is still present. ----- Message ----- From: Glennie Isle, CMA Sent: 08/24/2018   3:07 PM EDT To: Lucilla Lame, MD  Contacted this pt to schedule ERCP stent removal and pt stated he passed the stent in his stool a month or so ago. Do you want me to order a abdominal xray to confirm?

## 2018-08-29 ENCOUNTER — Telehealth: Payer: Self-pay | Admitting: Gastroenterology

## 2018-08-29 ENCOUNTER — Ambulatory Visit: Payer: Medicare Other | Attending: Gastroenterology

## 2018-08-29 NOTE — Telephone Encounter (Signed)
Kim with [facility] [place] called to inform us the patient did not come to the appointment.

## 2018-08-29 NOTE — Telephone Encounter (Signed)
Spoke with pt regarding his [place] that was scheduled for today. Pt stated he was going to have this done at the [place] at his next appt. Pt couldn't remember when it was. I advised pt to contact me with the date and have the report faxed to our office. Explained the importance of making sure the ERCP stent had in fact passed as if it did not he runs the risk of infection.

## 2020-01-31 ENCOUNTER — Other Ambulatory Visit: Payer: Self-pay

## 2020-01-31 ENCOUNTER — Other Ambulatory Visit: Payer: No Typology Code available for payment source

## 2020-01-31 DIAGNOSIS — Z20822 Contact with and (suspected) exposure to covid-19: Secondary | ICD-10-CM

## 2020-02-02 ENCOUNTER — Encounter: Payer: Self-pay | Admitting: General Practice

## 2020-02-02 LAB — NOVEL CORONAVIRUS, NAA: SARS-CoV-2, NAA: DETECTED — AB

## 2020-02-02 LAB — SARS-COV-2, NAA 2 DAY TAT

## 2020-02-02 NOTE — Telephone Encounter (Signed)
This encounter was created in error - please disregard.

## 2020-02-02 NOTE — Telephone Encounter (Signed)
Pt has covid positive results

## 2020-03-18 IMAGING — MR MR ABDOMEN WO/W CM MRCP
13 of 14 series · 45 of 48 positions shown · IV contrast (gadavist)
Comparison: Multiple exams, including 06/29/2018 CT and 06/30/2018
ultrasound

CLINICAL DATA: Abdominal pain radiating to the back with nausea and
vomiting, acute pancreatitis.

EXAM:
MRI ABDOMEN WITHOUT AND WITH CONTRAST (INCLUDING MRCP)
TECHNIQUE: Multiplanar multisequence MR imaging of the abdomen was performed
both before and after the administration of intravenous contrast.
Heavily T2-weighted images of the biliary and pancreatic ducts were
obtained, and three-dimensional MRCP images were rendered by post
processing.
CONTRAST:  9 cc Gadavist

[Series 2: T2 · coronal · 6.0mm · 1.38mm/px · 1 of 42 slices shown (1 of 2)]
[im 1/42]
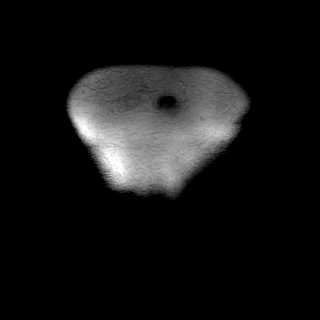

[Series 3: T2 · axial · 6.0mm · 1.38mm/px · 1 of 36 slices shown (2 of 2)]
[im 1/36]
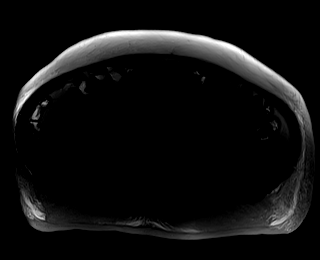

[Series 5: T2 fat-sat · axial · 6.0mm · 1.38mm/px · 1 of 36 slices shown]
[im 1/36]
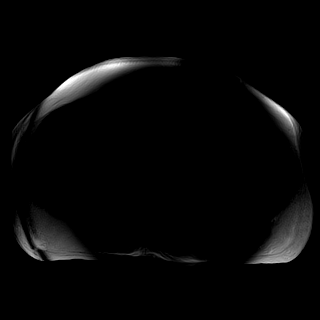

[Series 7: T1 · axial · 6.0mm · 0.86mm/px · z∈[-83,+169]mm · 3 of 72 slices shown]
[im 1/72]
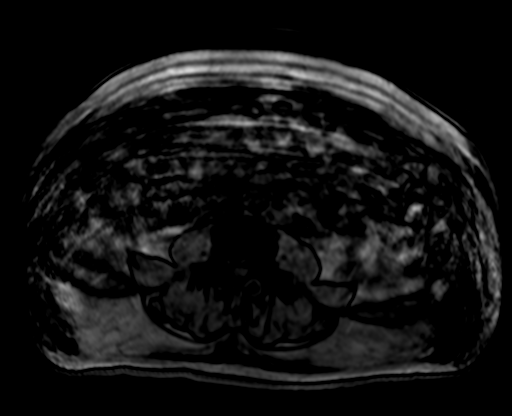
[im 36/72]
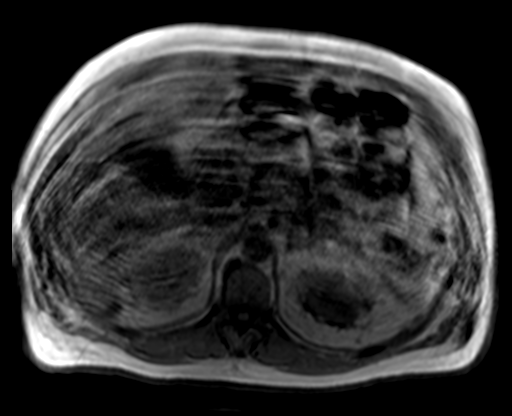
[im 72/72]
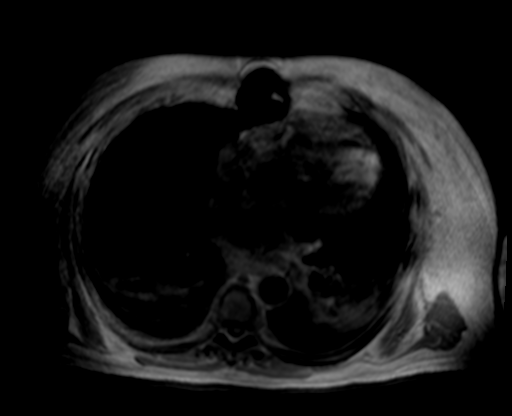

[Series 8: ax dwi_tracew · axial · 6.0mm · 1.64mm/px · z∈[-83,+169]mm · 5 of 108 slices shown]
[im 1/108]
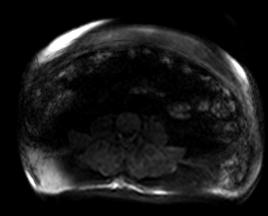
[im 27/108]
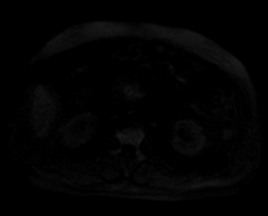
[im 54/108]
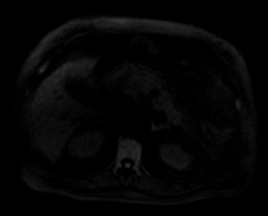
[im 81/108]
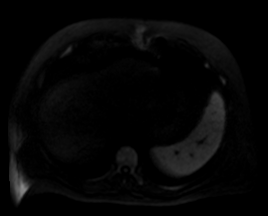
[im 108/108]
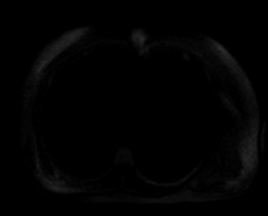

[Series 9: ax dwi_adc · axial · 6.0mm · 1.64mm/px · z∈[-83,+169]mm · 2 of 36 slices shown]
[im 1/36]
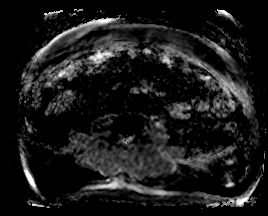
[im 36/36]
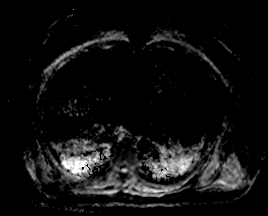

[Series 10: bSSFP · axial · 6.0mm · 0.86mm/px · z∈[-83,+169]mm · 2 of 36 slices shown]
[im 1/36]
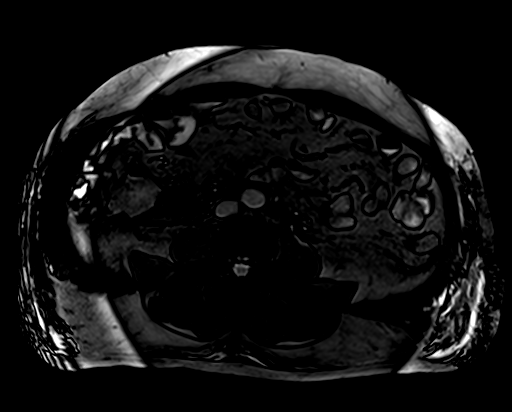
[im 36/36]
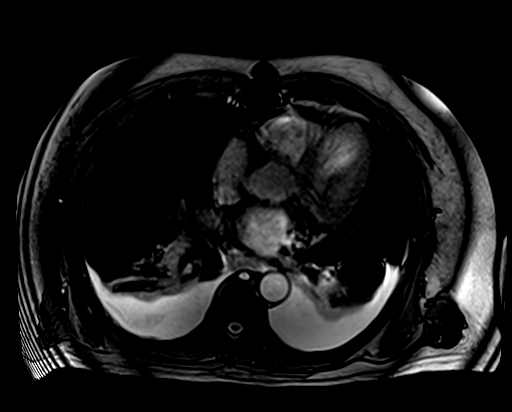

[Series 14: MRCP · coronal · 3.0mm · 1.38mm/px · 1 of 22 slices shown (1 of 2)]
[im 1/22]
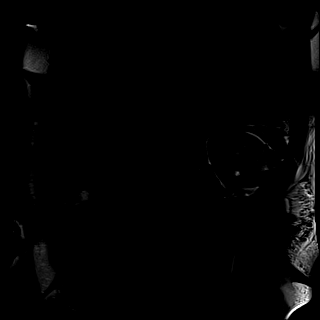

[Series 15: radials · coronal · 50.0mm · 1.04mm/px · 1 of 5 slices shown]
[im 1/5]
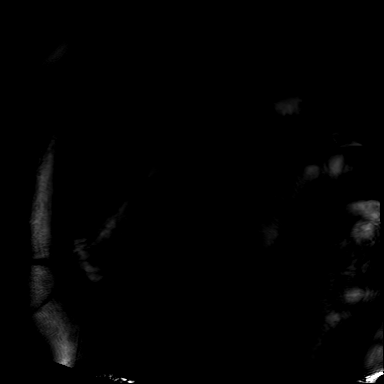

[Series 16: T1 fat-sat · axial · 3.0mm · 1.72mm/px · z∈[-88,+173]mm · 4 of 88 slices shown]
[im 1/88]
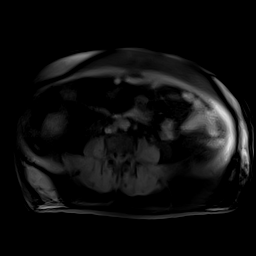
[im 30/88]
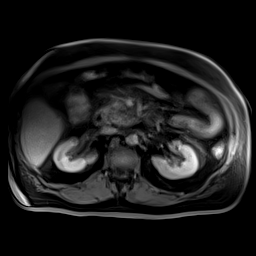
[im 59/88]
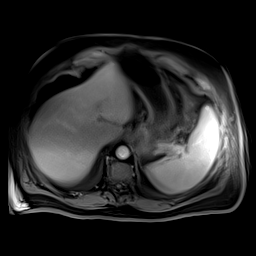
[im 88/88]
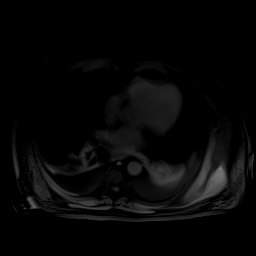

[Series 17: T1 dynamic post-contrast · coronal · 3.0mm · 1.38mm/px · 4 of 88 slices shown]
[im 1/88]
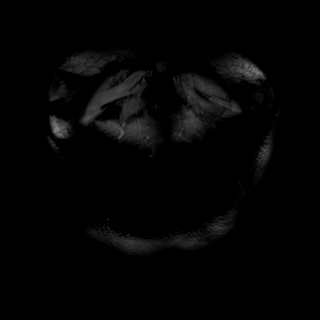
[im 30/88]
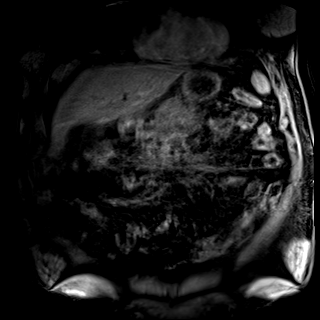
[im 59/88]
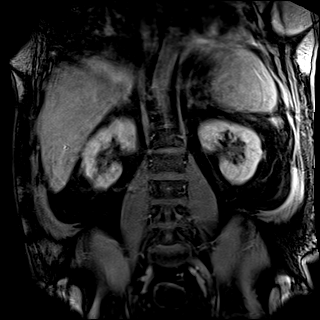
[im 88/88]
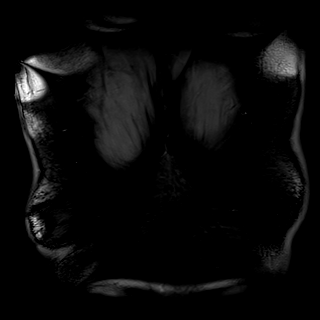

[Series 18: T1 dynamic fat-sat · axial · 3.0mm · 1.72mm/px · z∈[-88,+173]mm · 19 of 440 slices shown]
[im 1/440]
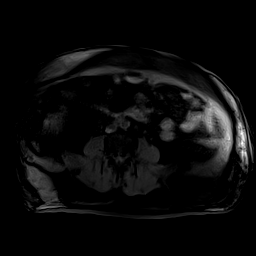
[im 25/440]
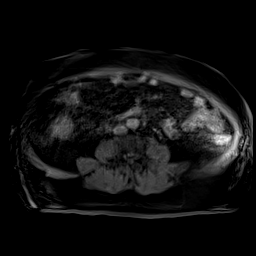
[im 49/440]
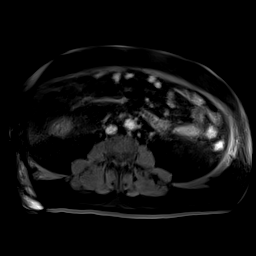
[im 74/440]
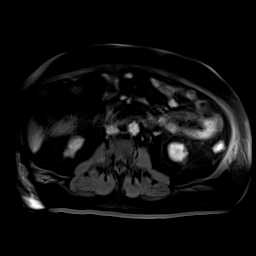
[im 98/440]
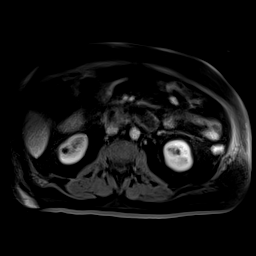
[im 122/440]
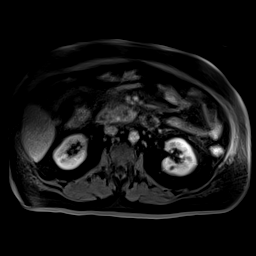
[im 147/440]
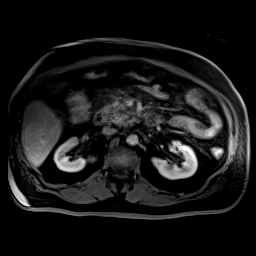
[im 171/440]
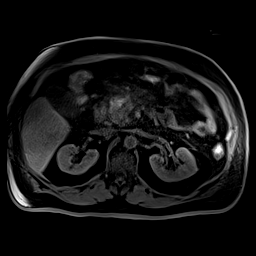
[im 196/440]
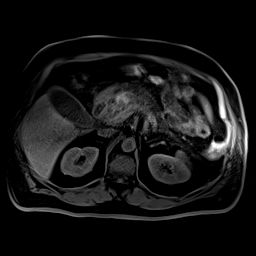
[im 220/440]
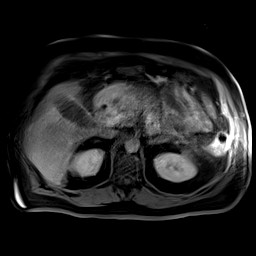
[im 244/440]
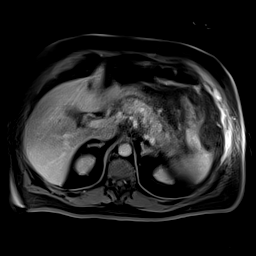
[im 269/440]
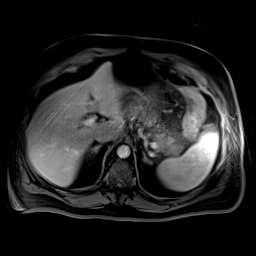
[im 293/440]
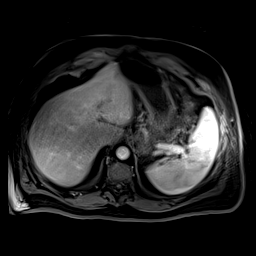
[im 318/440]
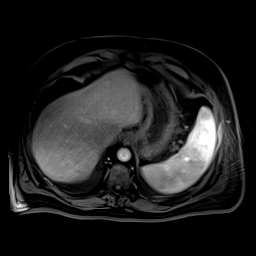
[im 342/440]
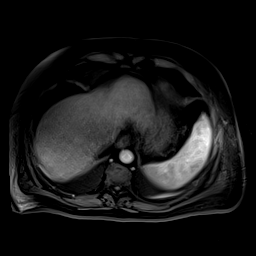
[im 366/440]
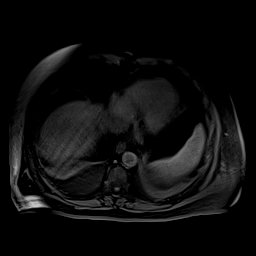
[im 391/440]
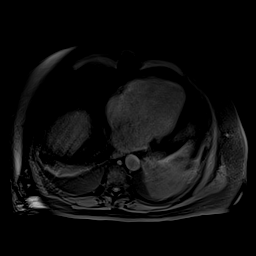
[im 415/440]
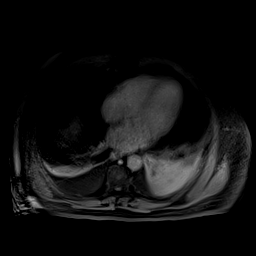
[im 440/440]
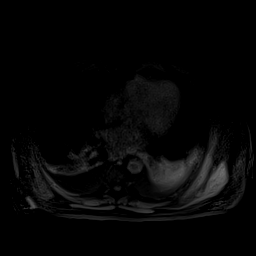

[Series 1020: MRCP · coronal · 0.6mm · 0.59mm/px · 1 of 19 slices shown (2 of 2)]
[im 1/19]
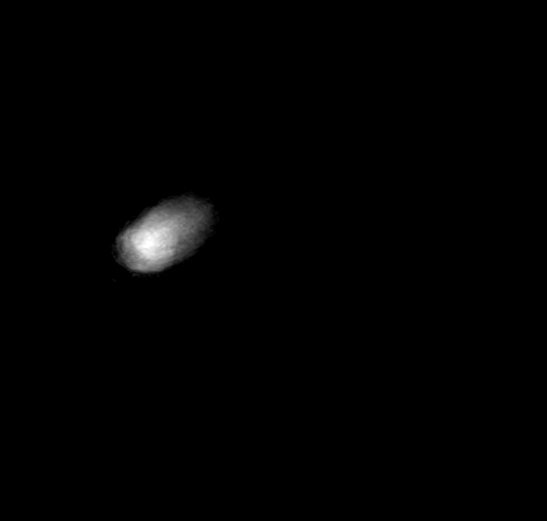

[45 of 48 positions shown; findings below may reference images not displayed]

FINDINGS: Despite efforts by the technologist and patient, motion artifact is
present on today's exam and could not be eliminated. This reduces
exam sensitivity and specificity.

Lower chest: Small to moderate bilateral pleural effusions with
associated passive atelectasis. Prior median sternotomy.

Hepatobiliary: Currently no biliary dilatation. Tiny gallstone in
the gallbladder. 1.5 by 1.4 cm dense calcified extracapsular lesion
along the posterior capsular margin of the right hepatic lobe on
image [DATE], not enhancing, etiology uncertain. No significant focal
liver lesion identified. No definite choledocholithiasis is
confirmed on today's study.

Pancreas: Moderate pancreatic and peripancreatic edema compatible
with acute pancreatitis. No pseudocyst, abscess, or findings of
pancreatic necrosis.

Spleen:  Unremarkable

Adrenals/Urinary Tract: Very small T2 hyperintense not appreciably
enhancing lesions in both kidneys are likely tiny cysts. The adrenal
glands appear normal.

Stomach/Bowel: Unremarkable

Vascular/Lymphatic: Aortoiliac atherosclerotic vascular disease.
Patent portal vein and splenic vein. No appreciable pathologic
adenopathy.

Other:  No supplemental non-categorized findings.

Musculoskeletal: Unremarkable
IMPRESSION: 1. Moderate pancreatic peripancreatic edema compatible with acute
pancreatitis. No pancreatic pseudocyst, abscess, or pancreatic
necrosis identified at this time.
2. Small to moderate bilateral pleural effusions with passive
atelectasis.
3. Tiny gallstone in the gallbladder. No current biliary dilatation.
4. 1.5 cm densely calcified lesion along the posterior capsular
margin of the right hepatic lobe. This is been present at least
since 0949 and could be a calcified exophytic cyst or focus of
calcified fat necrosis.
5.  Aortic Atherosclerosis ([id number]).
6. Despite efforts by the technologist and patient, motion artifact
is present on today's exam and could not be eliminated. This reduces
exam sensitivity and specificity.

## 2022-08-19 DEATH — deceased
